# Patient Record
Sex: Female | Born: 1955
Health system: Southern US, Community
[De-identification: ages and names within clinical notes are randomized; demographics above are authoritative.]

## PROBLEM LIST (undated history)

## (undated) DIAGNOSIS — G4730 Sleep apnea, unspecified: Secondary | ICD-10-CM

## (undated) DIAGNOSIS — E669 Obesity, unspecified: Secondary | ICD-10-CM

## (undated) DIAGNOSIS — Z789 Other specified health status: Secondary | ICD-10-CM

## (undated) DIAGNOSIS — R112 Nausea with vomiting, unspecified: Secondary | ICD-10-CM

## (undated) DIAGNOSIS — I10 Essential (primary) hypertension: Secondary | ICD-10-CM

## (undated) DIAGNOSIS — F419 Anxiety disorder, unspecified: Secondary | ICD-10-CM

## (undated) DIAGNOSIS — Z9889 Other specified postprocedural states: Secondary | ICD-10-CM

## (undated) DIAGNOSIS — Q211 Atrial septal defect, unspecified: Secondary | ICD-10-CM

## (undated) DIAGNOSIS — M1712 Unilateral primary osteoarthritis, left knee: Secondary | ICD-10-CM

## (undated) DIAGNOSIS — F329 Major depressive disorder, single episode, unspecified: Secondary | ICD-10-CM

## (undated) DIAGNOSIS — T4145XA Adverse effect of unspecified anesthetic, initial encounter: Secondary | ICD-10-CM

## (undated) DIAGNOSIS — E785 Hyperlipidemia, unspecified: Secondary | ICD-10-CM

## (undated) DIAGNOSIS — G43909 Migraine, unspecified, not intractable, without status migrainosus: Secondary | ICD-10-CM

## (undated) DIAGNOSIS — F32A Depression, unspecified: Secondary | ICD-10-CM

## (undated) DIAGNOSIS — C801 Malignant (primary) neoplasm, unspecified: Secondary | ICD-10-CM

## (undated) DIAGNOSIS — G932 Benign intracranial hypertension: Secondary | ICD-10-CM

## (undated) DIAGNOSIS — T8859XA Other complications of anesthesia, initial encounter: Secondary | ICD-10-CM

## (undated) DIAGNOSIS — G4733 Obstructive sleep apnea (adult) (pediatric): Secondary | ICD-10-CM

## (undated) DIAGNOSIS — E039 Hypothyroidism, unspecified: Secondary | ICD-10-CM

## (undated) HISTORY — PX: OTHER SURGICAL HISTORY: SHX169

## (undated) HISTORY — DX: Atrial septal defect: Q21.1

## (undated) HISTORY — PX: WISDOM TOOTH EXTRACTION: SHX21

## (undated) HISTORY — PX: FRACTURE SURGERY: SHX138

## (undated) HISTORY — DX: Obstructive sleep apnea (adult) (pediatric): G47.33

## (undated) HISTORY — DX: Atrial septal defect, unspecified: Q21.10

## (undated) HISTORY — DX: Migraine, unspecified, not intractable, without status migrainosus: G43.909

## (undated) HISTORY — DX: Anxiety disorder, unspecified: F41.9

## (undated) HISTORY — DX: Benign intracranial hypertension: G93.2

## (undated) HISTORY — DX: Hyperlipidemia, unspecified: E78.5

## (undated) HISTORY — PX: BUNIONECTOMY: SHX129

## (undated) HISTORY — PX: ORTHOPEDIC SURGERY: SHX850

## (undated) HISTORY — PX: BACK SURGERY: SHX140

## (undated) HISTORY — DX: Other specified health status: Z78.9

## (undated) HISTORY — DX: Sleep apnea, unspecified: G47.30

## (undated) HISTORY — DX: Obesity, unspecified: E66.9

## (undated) HISTORY — DX: Malignant (primary) neoplasm, unspecified: C80.1

---
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
## 1999-05-05 ENCOUNTER — Other Ambulatory Visit: Admission: RE | Admit: 1999-05-05 | Discharge: 1999-05-05 | Payer: Self-pay | Admitting: Obstetrics and Gynecology

## 2000-05-20 ENCOUNTER — Other Ambulatory Visit: Admission: RE | Admit: 2000-05-20 | Discharge: 2000-05-20 | Payer: Self-pay | Admitting: Obstetrics and Gynecology

## 2000-09-08 ENCOUNTER — Ambulatory Visit (HOSPITAL_BASED_OUTPATIENT_CLINIC_OR_DEPARTMENT_OTHER): Admission: RE | Admit: 2000-09-08 | Discharge: 2000-09-08 | Payer: Self-pay | Admitting: Orthopedic Surgery

## 2001-06-17 ENCOUNTER — Encounter: Payer: Self-pay | Admitting: Internal Medicine

## 2001-06-17 ENCOUNTER — Encounter: Admission: RE | Admit: 2001-06-17 | Discharge: 2001-06-17 | Payer: Self-pay | Admitting: Internal Medicine

## 2002-02-07 ENCOUNTER — Ambulatory Visit (HOSPITAL_BASED_OUTPATIENT_CLINIC_OR_DEPARTMENT_OTHER): Admission: RE | Admit: 2002-02-07 | Discharge: 2002-02-07 | Payer: Self-pay | Admitting: Internal Medicine

## 2002-04-12 ENCOUNTER — Other Ambulatory Visit: Admission: RE | Admit: 2002-04-12 | Discharge: 2002-04-12 | Payer: Self-pay | Admitting: Obstetrics and Gynecology

## 2003-02-21 ENCOUNTER — Encounter: Admission: RE | Admit: 2003-02-21 | Discharge: 2003-02-21 | Payer: Self-pay | Admitting: Internal Medicine

## 2003-02-23 ENCOUNTER — Encounter: Admission: RE | Admit: 2003-02-23 | Discharge: 2003-02-23 | Payer: Self-pay | Admitting: Internal Medicine

## 2003-09-06 ENCOUNTER — Other Ambulatory Visit: Admission: RE | Admit: 2003-09-06 | Discharge: 2003-09-06 | Payer: Self-pay | Admitting: Obstetrics and Gynecology

## 2003-11-26 ENCOUNTER — Ambulatory Visit: Payer: Self-pay | Admitting: Internal Medicine

## 2003-12-04 ENCOUNTER — Encounter: Admission: RE | Admit: 2003-12-04 | Discharge: 2003-12-04 | Payer: Self-pay | Admitting: Internal Medicine

## 2003-12-19 ENCOUNTER — Ambulatory Visit (HOSPITAL_COMMUNITY): Admission: RE | Admit: 2003-12-19 | Discharge: 2003-12-19 | Payer: Self-pay | Admitting: Internal Medicine

## 2004-05-09 ENCOUNTER — Ambulatory Visit: Payer: Self-pay | Admitting: Internal Medicine

## 2004-06-06 ENCOUNTER — Ambulatory Visit (HOSPITAL_BASED_OUTPATIENT_CLINIC_OR_DEPARTMENT_OTHER): Admission: RE | Admit: 2004-06-06 | Discharge: 2004-06-06 | Payer: Self-pay | Admitting: Internal Medicine

## 2004-06-15 ENCOUNTER — Ambulatory Visit: Payer: Self-pay | Admitting: Internal Medicine

## 2004-06-20 ENCOUNTER — Ambulatory Visit: Payer: Self-pay | Admitting: Internal Medicine

## 2004-09-12 ENCOUNTER — Ambulatory Visit: Payer: Self-pay | Admitting: Internal Medicine

## 2004-09-26 ENCOUNTER — Ambulatory Visit: Payer: Self-pay | Admitting: Internal Medicine

## 2004-10-10 ENCOUNTER — Other Ambulatory Visit: Admission: RE | Admit: 2004-10-10 | Discharge: 2004-10-10 | Payer: Self-pay | Admitting: Obstetrics and Gynecology

## 2004-10-17 ENCOUNTER — Encounter: Admission: RE | Admit: 2004-10-17 | Discharge: 2004-10-17 | Payer: Self-pay | Admitting: Internal Medicine

## 2005-12-28 ENCOUNTER — Encounter: Admission: RE | Admit: 2005-12-28 | Discharge: 2005-12-28 | Payer: Self-pay | Admitting: Internal Medicine

## 2006-01-13 ENCOUNTER — Inpatient Hospital Stay (HOSPITAL_COMMUNITY): Admission: AD | Admit: 2006-01-13 | Discharge: 2006-01-16 | Payer: Self-pay | Admitting: Internal Medicine

## 2006-01-13 ENCOUNTER — Encounter: Admission: RE | Admit: 2006-01-13 | Discharge: 2006-01-13 | Payer: Self-pay | Admitting: Internal Medicine

## 2006-01-18 ENCOUNTER — Ambulatory Visit: Payer: Self-pay | Admitting: Gastroenterology

## 2006-03-28 ENCOUNTER — Ambulatory Visit (HOSPITAL_COMMUNITY): Admission: RE | Admit: 2006-03-28 | Discharge: 2006-03-28 | Payer: Self-pay | Admitting: Ophthalmology

## 2006-04-06 ENCOUNTER — Ambulatory Visit (HOSPITAL_COMMUNITY): Admission: RE | Admit: 2006-04-06 | Discharge: 2006-04-06 | Payer: Self-pay | Admitting: Neurology

## 2006-04-16 ENCOUNTER — Ambulatory Visit (HOSPITAL_COMMUNITY): Admission: RE | Admit: 2006-04-16 | Discharge: 2006-04-16 | Payer: Self-pay | Admitting: Internal Medicine

## 2006-09-29 ENCOUNTER — Ambulatory Visit: Payer: Self-pay | Admitting: Vascular Surgery

## 2006-10-13 ENCOUNTER — Ambulatory Visit: Payer: Self-pay | Admitting: Vascular Surgery

## 2006-11-18 ENCOUNTER — Other Ambulatory Visit: Admission: RE | Admit: 2006-11-18 | Discharge: 2006-11-18 | Payer: Self-pay | Admitting: Radiology

## 2006-11-18 ENCOUNTER — Encounter: Admission: RE | Admit: 2006-11-18 | Discharge: 2006-11-18 | Payer: Self-pay | Admitting: Neurology

## 2006-11-18 ENCOUNTER — Encounter (INDEPENDENT_AMBULATORY_CARE_PROVIDER_SITE_OTHER): Payer: Self-pay | Admitting: Radiology

## 2007-07-01 ENCOUNTER — Ambulatory Visit: Payer: Self-pay | Admitting: Cardiovascular Disease

## 2007-07-01 ENCOUNTER — Ambulatory Visit (HOSPITAL_COMMUNITY): Admission: RE | Admit: 2007-07-01 | Discharge: 2007-07-01 | Payer: Self-pay | Admitting: Cardiology

## 2007-10-25 ENCOUNTER — Encounter: Admission: RE | Admit: 2007-10-25 | Discharge: 2007-10-25 | Payer: Self-pay | Admitting: Obstetrics and Gynecology

## 2007-10-25 ENCOUNTER — Ambulatory Visit: Payer: Self-pay | Admitting: Internal Medicine

## 2008-02-03 ENCOUNTER — Ambulatory Visit: Payer: Self-pay | Admitting: Internal Medicine

## 2008-05-07 ENCOUNTER — Ambulatory Visit: Payer: Self-pay | Admitting: Internal Medicine

## 2009-05-06 ENCOUNTER — Encounter: Admission: RE | Admit: 2009-05-06 | Discharge: 2009-05-06 | Payer: Self-pay | Admitting: Internal Medicine

## 2009-05-31 ENCOUNTER — Ambulatory Visit: Payer: Self-pay | Admitting: Internal Medicine

## 2009-06-21 ENCOUNTER — Ambulatory Visit: Payer: Self-pay | Admitting: Internal Medicine

## 2009-08-26 ENCOUNTER — Encounter (INDEPENDENT_AMBULATORY_CARE_PROVIDER_SITE_OTHER): Payer: Self-pay | Admitting: *Deleted

## 2009-10-14 ENCOUNTER — Encounter (INDEPENDENT_AMBULATORY_CARE_PROVIDER_SITE_OTHER): Payer: Self-pay | Admitting: *Deleted

## 2009-11-07 ENCOUNTER — Encounter (INDEPENDENT_AMBULATORY_CARE_PROVIDER_SITE_OTHER): Payer: Self-pay | Admitting: *Deleted

## 2009-11-08 ENCOUNTER — Ambulatory Visit: Payer: Self-pay | Admitting: Internal Medicine

## 2009-11-18 ENCOUNTER — Ambulatory Visit: Payer: Self-pay | Admitting: Internal Medicine

## 2010-02-08 ENCOUNTER — Encounter: Payer: Self-pay | Admitting: Internal Medicine

## 2010-02-09 ENCOUNTER — Encounter: Payer: Self-pay | Admitting: Internal Medicine

## 2010-02-18 NOTE — Letter (Signed)
Summary: Pre Visit Letter Revised  [facility]  [address] [place], [place] [address]   Phone: [phone]  Fax: [phone]        10/14/2009 MRN: [id number] [facility] [address] [place], [place]  [address]             Procedure Date:  11-18-09   Welcome to the Gastroenterology Division at [facility].    You are scheduled to see a nurse for your pre-procedure visit on 11-04-09 at 4:30p.m. on the 3rd floor at [facility], [address]  We ask that you try to arrive at our office 15 minutes prior to your appointment time to allow for check-in.  Please take a minute to review the attached form.  If you answer "Yes" to one or more of the questions on the first page, we ask that you call the person listed at your earliest opportunity.  If you answer "No" to all of the questions, please complete the rest of the form and bring it to your appointment.    Your nurse visit will consist of discussing your medical and surgical history, your immediate family medical history, and your medications.   If you are unable to list all of your medications on the form, please bring the medication bottles to your appointment and we will list them.  We will need to be aware of both prescribed and over the counter drugs.  We will need to know exact dosage information as well.    Please be prepared to read and sign documents such as consent forms, a financial agreement, and acknowledgement forms.  If necessary, and with your consent, a friend or relative is welcome to sit-in on the nurse visit with you.  Please bring your insurance card so that we may make a copy of it.  If your insurance requires a referral to see a specialist, please bring your referral form from your primary care physician.  No co-pay is required for this nurse visit.     If you cannot keep your appointment, please call [phone] to cancel or reschedule prior to your appointment date.  This  allows [place] the opportunity to schedule an appointment for another patient in need of care.    Thank you for choosing [facility] for your medical needs.  We appreciate the opportunity to care for you.  Please visit [place] at our website  to learn more about our practice.  Sincerely, The Gastroenterology Division

## 2010-02-18 NOTE — Miscellaneous (Signed)
Summary: previsit prep/rm  Clinical Lists Changes  Medications: Added new medication of MIRALAX   POWD (POLYETHYLENE GLYCOL 3350) As per prep  instructions. - Signed Added new medication of DULCOLAX 5 MG  TBEC (BISACODYL) Day before procedure take 2 at 3pm and 2 at 8pm. - Signed Added new medication of REGLAN 10 MG  TABS (METOCLOPRAMIDE HCL) As per prep instructions. - Signed Rx of MIRALAX   POWD (POLYETHYLENE GLYCOL 3350) As per prep  instructions.;  #255gm x 0;  Signed;  Entered by: Sherren Kerns RN;  Authorized by: Hart Carwin MD;  Method used: [phone] [address] [place], [place], [place]  [address], Ph: [phone] or [phone], Fax: [phone] Rx of DULCOLAX 5 MG  TBEC (BISACODYL) Day before procedure take 2 at 3pm and 2 at 8pm.;  #4 x 0;  Signed;  Entered by: Sherren Kerns RN;  Authorized by: Hart Carwin MD;  Method used: [facility]ically to [facility] [phone]*, [address] [place], [place], [place]  [address], Ph: [phone] or [phone], Fax: [phone] Rx of REGLAN 10 MG  TABS (METOCLOPRAMIDE HCL) As per prep instructions.;  #2 x 0;  Signed;  Entered by: Sherren Kerns RN;  Authorized by: Hart Carwin MD;  Method used: [facility]ically [facility] [phone]*, [address] [place], [place], [place]  [address], Ph: [phone] or [phone], Fax: [phone] Observations: Added new observation of ALLERGY REV: Done (11/08/2009 13:25) Added new observation of NKA: T (11/08/2009 13:25)    Prescriptions: REGLAN 10 MG  TABS (METOCLOPRAMIDE HCL) As per prep instructions.  #2 x 0   Entered by:   Sherren Kerns RN   Authorized by:   Hart Carwin MD   Signed by:   Sherren Kerns RN on 11/08/2009   Method used:   [facility] to        [facility]  [address] [phone][facility] (retail)       [address]       [place], [place]  [address]       Ph: [phone] or [phone]       Fax: [phone]   RxID:   [id number] DULCOLAX 5 MG  TBEC (BISACODYL) Day before  procedure take 2 at 3pm and 2 at 8pm.  #4 x 0   Entered by:   Sherren Kerns RN   Authorized by:   Hart Carwin MD   Signed by:   Sherren Kerns RN on 11/08/2009   Method used:   [facility]ron[facility] to        [facility]  [address] [phone][facility] (retail)       [address] [place], [place]  [address]       Ph: [phone] or [phone]       Fax: [phone]   RxID:   [id number] MIRALAX   POWD (POLYETHYLENE GLYCOL 3350) As per prep  instructions.  #255gm x 0   Entered by:   Sherren Kerns RN   Authorized by:   Hart Carwin MD   Signed by:   Sherren Kerns RN on 11/08/2009   Method used:   [facility]ron[facility]        [facility]  [address] [phone][facility] (retail)       [address]  [place], [place]  [address]       Ph: [phone] or [phone]       Fax: [phone]   RxID:   [id number]

## 2010-02-18 NOTE — Letter (Signed)
Summary: Colonoscopy Letter  [facility]  [address] [place], [place] [address]   Phone: [phone]  Fax: [phone]      August 26, 2009 MRN: [id number]   [facility] [address] [place], [place]  [address]   Dear Ms. [facility],   According to your medical record, it is time for you to schedule a Colonoscopy. The American Cancer Society recommends this procedure as a method to detect early colon cancer. Patients with a family history of colon cancer, or a personal history of colon polyps or inflammatory bowel disease are at increased risk.  This letter has been generated based on the recommendations made at the time of your procedure. If you feel that in your particular situation this may no longer apply, please contact our office.  Please call our office at [phone] to schedule this appointment or to update your records at your earliest convenience.  Thank you for cooperating with [place] to provide you with the very best care possible.   Sincerely,  Hedwig Morton. Juanda Chance, M.D.  [facility] Gastroenterology Division [phone]

## 2010-02-18 NOTE — Procedures (Signed)
Summary: Colonoscopy  Patient: Diana [name] Note: All result statuses are Final unless otherwise noted.  Tests: (1) Colonoscopy (COL)   COL Colonoscopy           DONE     [facility]     [address]     [place], [place]  [address]           COLONOSCOPY PROCEDURE REPORT           PATIENT:  Diana, [name]  MR#:  [id number]     BIRTHDATE:  06/08/1955, 54 yrs. old  GENDER:  female     ENDOSCOPIST:  Hedwig Morton. Juanda Chance, MD     REF. BY:  Sharlet Salina, M.D.     PROCEDURE DATE:  11/18/2009     PROCEDURE:  Colonoscopy 00867     ASA CLASS:  Class II     INDICATIONS:  Routine Risk Screening last colon 2006 was     incomplete     MEDICATIONS:   Versed 10 mg, Fentanyl 100 mcg           DESCRIPTION OF PROCEDURE:   After the risks benefits and     alternatives of the procedure were thoroughly explained, informed     consent was obtained.  Digital rectal exam was performed and     revealed no rectal masses.   The LB PCF-H180AL [id number] endoscope     was introduced through the anus and advanced to the cecum, which     was identified by both the appendix and ileocecal valve, without     limitations.  The quality of the prep was good, using MiraLax.     The instrument was then slowly withdrawn as the colon was fully     examined.     <<PROCEDUREIMAGES>>           FINDINGS:  No polyps or cancers were seen (see image1, image2,     image5, image6, image8, and image9).   Retroflexed views in the     rectum revealed no abnormalities.    The scope was then withdrawn     from the patient and the procedure completed.           COMPLICATIONS:  None     ENDOSCOPIC IMPRESSION:     1) No polyps or cancers     2) Normal colonoscopy     redundant colon     RECOMMENDATIONS:     1) high fiber diet     REPEAT EXAM:  In 10 year(s) for.           ______________________________     Hedwig Morton. Juanda Chance, MD           CC:           n.     eSIGNED:   Hedwig Morton. Brodie at 11/18/2009 02:19 PM          Berline Lopes, [phone]  Note: An exclamation mark (!) indicates a result that was not dispersed into the flowsheet. Document Creation Date: 11/18/2009 2:20 PM _______________________________________________________________________  (1) Order result status: Final Collection or observation date-time: 11/18/2009 14:12 Requested date-time:  Receipt date-time:  Reported date-time:  Referring Physician:   Ordering Physician: Lina Sar [phone]) Specimen Source:  Source: Launa Grill Order Number: [id number] Lab site:   Appended Document: Colonoscopy    Clinical Lists Changes  Observations: Added new observation of COLONNXTDUE: 10/2019 (11/18/2009 16:01)

## 2010-02-18 NOTE — Letter (Signed)
Summary: Miralax Instructions  [facility]  [address]   [place], [place] [address]   Phone: [phone]  Fax: [phone]       RAINN ZUPKO    20-Feb-1955    MRN: [id number]       Procedure Day /Date: Monday, 11-18-09     Arrival Time: 12:30 p.m.     Procedure Time: 1:30 p.m.     Location of Procedure:                    x  [facility] (4th Floor)    PREPARATION FOR COLONOSCOPY WITH MIRALAX  Starting 5 days prior to your procedure 11-13-09 do not eat nuts, seeds, popcorn, corn, beans, peas,  salads, or any raw vegetables.  Do not take any fiber supplements (e.g. Metamucil, Citrucel, and Benefiber). ____________________________________________________________________________________________________   THE DAY BEFORE YOUR PROCEDURE         DATE: 11-17-09  DAY: Sunday  1   Drink clear liquids the entire day-NO SOLID FOOD  2   Do not drink anything colored red or purple.  Avoid juices with pulp.  No orange juice.  3   Drink at least 64 oz. (8 glasses) of fluid/clear liquids during the day to prevent dehydration and help the prep work efficiently.  CLEAR LIQUIDS INCLUDE: Water Jello Ice Popsicles Tea (sugar ok, no milk/cream) Powdered fruit flavored drinks Coffee (sugar ok, no milk/cream) Gatorade Juice: apple, white grape, white cranberry  Lemonade Clear bullion, consomm, broth Carbonated beverages (any kind) Strained chicken noodle soup Hard Candy  4   Mix the entire bottle of Miralax with 64 oz. of Gatorade/Powerade in the morning and put in the refrigerator to chill.  5   At 3:00 pm take 2 Dulcolax/Bisacodyl tablets.  6   At 4:30 pm take one Reglan/Metoclopramide tablet.  7  Starting at 5:00 pm drink one 8 oz glass of the Miralax mixture every 15-20 minutes until you have finished drinking the entire 64 oz.  You should finish drinking prep around 7:30 or 8:00 pm.  8   If you are nauseated, you may take the 2nd  Reglan/Metoclopramide tablet at 6:30 pm.        9    At 8:00 pm take 2 more DULCOLAX/Bisacodyl tablets.     THE DAY OF YOUR PROCEDURE      DATE:  11-18-09  DAY: Monday  You may drink clear liquids until 11:30 a.m.  (2 HOURS BEFORE PROCEDURE).   MEDICATION INSTRUCTIONS  Unless otherwise instructed, you should take regular prescription medications with a small sip of water as early as possible the morning of your procedure.   Additional medication instructions: n/a         OTHER INSTRUCTIONS  You will need a responsible adult at least 57 years of age to accompany you and drive you home.   This person must remain in the waiting room during your procedure.  Wear loose fitting clothing that is easily removed.  Leave jewelry and other valuables at home.  However, you may wish to bring a book to read or an iPod/MP3 player to listen to music as you wait for your procedure to start.  Remove all body piercing jewelry and leave at home.  Total time from sign-in until discharge is approximately 2-3 hours.  You should go home directly after your procedure and rest.  You can resume normal activities the day after your procedure.  The day of your procedure you should  not:   Drive   Make legal decisions   Operate machinery   Drink alcohol   Return to work  You will receive specific instructions about eating, activities and medications before you leave.   The above instructions have been reviewed and explained to me by   Sherren Kerns RN  November 08, 2009 1:40 PM    I fully understand and can verbalize these instructions _____________________________ Date _______

## 2010-04-14 ENCOUNTER — Ambulatory Visit (INDEPENDENT_AMBULATORY_CARE_PROVIDER_SITE_OTHER): Payer: 59 | Admitting: Internal Medicine

## 2010-04-14 ENCOUNTER — Ambulatory Visit: Payer: 59 | Admitting: Internal Medicine

## 2010-04-14 DIAGNOSIS — J209 Acute bronchitis, unspecified: Secondary | ICD-10-CM

## 2010-04-14 DIAGNOSIS — J019 Acute sinusitis, unspecified: Secondary | ICD-10-CM

## 2010-04-15 ENCOUNTER — Ambulatory Visit: Payer: Self-pay | Admitting: Internal Medicine

## 2010-06-03 NOTE — Consult Note (Signed)
NEW PATIENT CONSULTATION   PAMLA, PANGLE  DOB:  [birth year]                                       10/13/2006  ZOXWR#:[id number]   REASON FOR VISIT:  Harriett Azar was in today for evaluation of left  leg edema.  She reports that she has had a long history of swelling in  both lower extremities and this is more so on the left than on the  right.  She also has some degenerative disk disease that has been  attributed to some of her leg pain.  She also has some knee swelling and  pain and had recently undergone deep tissue left leg massage for her  discomfort.  She had marked swelling following this and underwent non-  invasive vascular laboratory study in her office on 09/29/2006.  This  was negative for DVT but did show some evidence of mild valvular  incompetence and I am seeing her for further discussion of this.   PAST MEDICAL HISTORY:  She denies any prior history of DVT.  She does  have a remote history of pinning in her left 5th metatarsal bone.  She  has had bilateral bunionectomy.  She also has history of right knee  replacement.  She does attempt to wear over the counter compression  garments but reports this actually makes matters worse at times.   PAST MEDICAL HISTORY:  Negative for pulmonary embolus.  She is not on  any anticoagulation therapy.  She does not have any history of  hypertension, diabetes or other major medical difficulties.   PHYSICAL EXAMINATION:  She is a well developed, well nourished white  female appearing stated age of 15.  She does not have any significant  right or left leg swelling today.  She has some scattered small spider  vein telangiectasia but no evidence of large varicosities.  I reviewed  her duplex with her and also I did a screening study with hand held  duplex myself.  This does not show any evidence of significant saphenous  vein reflux or incompetence.  Duplex did show a mild partial reflex at  her left common  femoral vein and saphenofemoral junction.   DISCUSSION:  I discussed this at length with Ms. Kyung Rudd.  I explained  that I do not feel that her swelling or pain is attributable to the mild  partial incompetence proximally at her saphenofemoral junction.  I  cannot relate this to the episode of deep tissue massage but certainly  her swelling did come on immediately following this.  I have recommended  observation only and would not recommend any more aggressive treatment  since she does not have any evidence of severe reflux.  She was  comfortable with this and also understands that there is no evidence of  DVT or other serious issue regarding this.  She will see [place] again on an  as needed basis.   Larina Earthly, M.D.  Electronically Signed   TFE/MEDQ  D:  10/13/2006  T:  10/14/2006  Job:  [id number]   cc:   Katy Fitch. Sypher, M.D.

## 2010-06-03 NOTE — Procedures (Signed)
DUPLEX DEEP VENOUS EXAM - LOWER EXTREMITY   INDICATION:  Left calf pain and edema.   HISTORY:  Edema:  Chronic left calf edema.  Trauma/Surgery:  Patient has a pin in her left metatarsal since 2003.  She has had a recent deep tissue left leg massage.  Pain:  Chronic left calf pain.  PE:  No.  Previous DVT:  No.  Anticoagulants:  No.  Other:  No.   DUPLEX EXAM:                CFV   SFV   PopV  PTV    GSV                R  L  R  L  R  L  R   L  R  L  Thrombosis       0     0     0      0     0  Spontaneous      +     +     +      +     +  Phasic           +     +     +      +     +  Augmentation     +     +     +      +     +  Compressible     +     +     +      +     +  Competent        P     +     +      +     P   Legend:  + - yes  o - no  p - partial  D - decreased   IMPRESSION:  1. No evidence of left leg deep venous thrombosis or superficial vein      thrombosis.  2. No evidence of left leg Baker cyst.  3. The left common femoral vein and saphenofemoral junctions are      mildly incompetent.  4. There appears to be an incompetent perforator vein seen in the      proximal medial calf.    _____________________________  Larina Earthly, M.D.   MC/MEDQ  D:  09/29/2006  T:  09/30/2006  Job:  [id number]

## 2010-06-06 NOTE — Op Note (Signed)
[place]  Patient:    Diana [name], Diana [name] Visit Number: [id number] MRN: [id number]          Service Type: DSU Location: [facility] Attending Physician:  Colbert Ewing Proc. Date: 09/08/00 Adm. Date:  09/08/2000                             Operative Report  PREOPERATIVE DIAGNOSIS:  Displaced fifth metatarsal shaft fracture, left foot.  POSTOPERATIVE DIAGNOSIS:  Displaced fifth metatarsal shaft fracture, left foot.  PROCEDURE:  Open reduction and internal fixation, fifth metatarsal fracture, with cannulated 6-8 mm 4.5 partially-threaded screw.  SURGEON:  Loreta Ave, M.D.  ASSISTANT:  Arlys John D. Petrarca, P.A.-C.  ANESTHESIA:  Ankle block.  SPECIMENS:  None.  CULTURES:  None.  COMPLICATIONS:  None.  DRESSING:  Soft compressive with Cam walker.  TOURNIQUET TIME:  30 minutes with Esmarch at the ankle.  DESCRIPTION OF PROCEDURE:  Patient brought to the operating room and placed on the operating table in supine position.  After adequate anesthesia had been obtained, the left foot was prepped and draped in the usual sterile fashion. Exsanguinated with elevation and Esmarch, Esmarch left as a tourniquet at the ankle.  Fluoroscopy used for guidance.  Able to reduce the distal fracture by closed means into near-anatomic alignment with distraction and manual pressure at the fracture site.  Then incision was made at the base of the fifth metatarsal.  The base was accessed, and a guidewire was passed from the base of the fifth metatarsal across the fracture site, into the head and not penetrating into the joint.  The fracture was held reduced anatomically while this was done.  I measured for a 6.8 mm partially-threaded screw.  Predrilled. The screw was then placed from proximal to distal across the fracture site with excellent capturing and fixation into the fifth metatarsal head and not penetrating into the joint.  This yielded anatomic  alignment on all views with solid, stable fixation.  The wound was irrigated.  Prior to this, the screw had been sufficiently countersunk so that it would not be prominent.  The wound was closed with nylon.  The margins were injected with Marcaine. Sterile compressive dressing applied.  Esmarch removed.  Cam walker applied. Anesthesia reversed.  Brought to recovery room.  Tolerated the surgery well, no complications. Attending Physician:  Colbert Ewing DD:  09/08/00 TD:  09/09/00 Job: [id number]

## 2010-06-06 NOTE — Procedures (Signed)
NAME:  Diana [name], Diana [name] NO.:  [id number]   MEDICAL RECORD NO.:  [id number]          PATIENT TYPE:  OUT   LOCATION:  SLEEP CENTER                 FACILITY:  [facility]   PHYSICIAN:  Clinton D. Maple Hudson, M.D. DATE OF BIRTH:  March 16, 1955   DATE OF STUDY:  06/06/2004                              NOCTURNAL POLYSOMNOGRAM   REFERRING PHYSICIAN:  Dr. Jetty Duhamel   DATE OF STUDY:  Jun 06, 2004   INDICATION FOR STUDY:  Insomnia with sleep apnea.  Epworth Sleepiness Score  12/24, BMI 31, weight 210 pounds.   SLEEP ARCHITECTURE:  Total sleep time 325 minutes with sleep efficiency 75%.  Stage I was 12%, stage II 51%, stages III and IV 22% and REM was 15% of  total sleep time.  Sleep latency 38 minutes, REM latency 135 minutes, awake  after sleep onset 75 minutes, arousal index 15.   RESPIRATORY DATA:  Respiratory disturbance index (RDI, AHI) 0.7 obstructive  events per hour which is within normal.  There were 4 hypopneas.  She slept  supine.  REM RDI 4.9.   OXYGEN DATA:  Insignificant snoring noted.  Oxygen saturation nadir was 90%  with mean oxygen saturation through the study 96% on room air.   CARDIAC DATA:  Normal sinus rhythm.   MOVEMENT/PARASOMNIA:  A total of 116 limb jerks were recorded of which 9  were associated with arousal or awakening for a periodic limb movement with  arousal index 1.7 per hour which is unremarkable.   IMPRESSION/RECOMMENDATION:  1.  Occasional sleep-disordered breathing events, within normal limits.  2.  Some nonspecific waking after sleep onset.  3.  Very mild periodic limb movement with arousal of 1.7 per hour.      CDY/MEDQ  D:  06/15/2004 10:50:15  T:  06/15/2004 11:37:01  Job:  [id number]

## 2010-06-06 NOTE — Discharge Summary (Signed)
NAME:  Diana [name], Diana [name] NO.:  [id number]   MEDICAL RECORD NO.:  [id number]          PATIENT TYPE:  INP   LOCATION:  5012                         FACILITY:  [facility]   PHYSICIAN:  Luanna Cole. Lenord Fellers, M.D.   DATE OF BIRTH:  19-Feb-1955   DATE OF ADMISSION:  01/13/2006  DATE OF DISCHARGE:  01/16/2006                               DISCHARGE SUMMARY   FINAL DIAGNOSES:  1. Volume depletion.  2. Viral syndrome.  3. History of depression.  4. Selective serotonin reuptake inhibitor discontinuation syndrome.   CONDITION:  Good and improved.   DISPOSITION:  Discharge home with follow up with Dr. Lina Sar on  February 15, 2006.  Patient is to have gastric emptying study January 26, 2006.   DISCHARGE MEDICATION:  Luvox 150 mg p.o. q.a.m. and 200 mg h.s.   CONSULTANT:  Dr. Barnet Pall with [facility].   PROCEDURES:  Upper endoscopy.   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  Initially, she had fever, myalgias, malaise, and no  appetite.  Subsequently developed nausea, but only vomited once.  She  was seen at [facility] December 26, 2005 and diagnosed with viral  syndrome.  She called me December 27, 2005 for nausea prescription.  Phenergan was called in for her.  Later this was changed to Zofran after  persistent malaise and nausea with no appetite.  Patient was placed on  Protonix January 04, 2006.  Labs in my office were within normal limits  and included a CBC, CMET with liver function test and hemoglobin A1c.  The patient had been given IV fluids in my office on 3 occasions since  December 28, 2005.  Each time 1 liter of lactated Ringer was given IV.  She would improved after IV fluids, but would revert back to lethargy,  no appetite and generalized weakness.  Patient has been on Luvox for  years, 150 mg p.o. q.a.m. and 200 mg h.s.  for history of depression.  However, since the patient was so nauseated she had discontinued Luvox  some 3 weeks before admission and I was not aware of it until the day of  admission.  She also had stopped Protonix.  The patient had been treated  with antibiotics because of cough (Levaquin), but took only 7 days  instead of the prescribed 10 days.  Patient had 2 chest x-rays, one on  December 28, 2005 and another one on January 13, 2006, both of which  showed no evidence of pneumonia.  Patient was admitted January 13, 2006  with volume depletion.  Urine specific gravity was 1.020, blood pressure  was 122/82.  Patient had no orthostatic blood pressure changes, but when  standing she became tachycardic to 126.  She has been unable to work  since December 22, 2005.  She was admitted for IV fluid rehydration,  evaluation by gastroenterologist and restarting of Luvox.  On the day of  admission the patient had a weight of 206 pounds.  Previous weight on  December 28, 2005 had been 221 pounds.  She had low-grade temperature of  100.4 degrees on December 28, 2005.  Thyroid functions were checked in  September 2007 and were normal.  In November 2005 she had a protracted  respiratory infection lasting some 4 weeks with fatigue, improved with  antibiotics and prednisone.  CT at that time showed mucosal thickening  in her sinuses.  At that time she had an extensive workup and Epstein-  Barr titer showed past infection and CMV titer was negative.  In 2006  she saw Dr. Fannie Knee regarding possible sleep apnea, but she had 2  sleep studies which were normal.  Nonetheless, she purchased a CPAP  apparatus for herself, saying that she felt better rested with it.  Patient sees Dr. Andee Poles for longstanding depression that  started after fertility treatments years ago.  Patient has tried  Wellbutrin and Provigil, but stayed on Luvox for years until about 3  weeks prior to admission.  Patient had  colonoscopy in September 2006 by  Dr. Lina Sar which was normal except Dr. Juanda Chance was unable to  intubate the cecal pouch.  Patient's husband was recently diagnosed with  a giant colon polyp which is not cancerous and he scheduled to have that  removed soon.  It is my suspicion she is not eating and drinking as much  as she has indicated to me at home, because her specific gravity of her  urine is fairly concentrated at 1.020.  She does have hemoconcentration  on her CBC and there are ketones (starvation) in her urine.  Patient  says she has a remote history of stomach lining inflammation but no  old records to confirm this.  She says she has stomach and colon cancer  in aunts and uncles on both sides of the family.  The patient is a  nonsmoker.  Patient is married, has no children, and moved here around  2000.  She had a pin placed in her left fifth metatarsal in August 2002.  She had an L5-S1 laminectomy at [facility] in [place].  She had bilateral bunionectomies in 1982 in [place], [place].   FAMILY HISTORY:  Mother with history of hypertension, glaucoma and  osteoarthritis.  Father with history of early Alzheimer's disease and a  minor CVA.  One brother in good health.   PHYSICAL EXAM:  VITAL SIGNS:  Weight 206 pounds.  Previous weight 221  pounds December 28, 2005.  Temperature 96.8 degrees orally.  Blood  pressure 132/92, pulse 106 regular lying.  Sitting pulse is 120 with  blood pressure of 136/88.  Standing blood pressure is 116/90 with pulse  of 126.  SKIN:  Is cool and dry.  HEENT EXAM:  Head is normocephalic, atraumatic.  Sclerae and  conjunctivae are clear.  TMs and pharynx are clear.  NECK:  Is supple without thyromegaly or adenopathy.  CHEST:  Clear to auscultation.  CARDIAC EXAM:  Regular rate and rhythm.  No murmur appreciated.  ABDOMEN:  No organomegaly, masses or tenderness.  EXTREMITIES:  No deformity or edema.  HOSPITAL COURSE:  Patient was  admitted for vigorous IV fluid hydration  with D5 normal saline at 125 mL/hour for 12 hours then decreasing to 100  mL/hour.  The patient was placed on a clear liquid diet and was given  Tylenol 2 tablets p.o. q.4h. p.r.n. minor pain.  Amylase and lipase were  checked and proved to be within normal limits.  She was placed on  Protonix 40 mg daily and started back on Luvox 150 mg p.o. q.a.m. and  200 mg p.o. q.h.s.  She was also given Zofran 4 mg sublingual q.8h. as  needed for nausea.  The following day CBC and BMET were drawn and proved  to be within normal limits.  Diet was advanced to a bland soft diet.  She was seen in consultation by Dr. Melvia Heaps with [facility] and  she underwent endoscopy on January 15, 2006 which was normal from  proximal esophagus to the second portion of the duodenum.  A gastric  emptying study was suggested by Dr. Arlyce Dice.  It could not be arranged  inpatient and was scheduled as an outpatient after discharge January 26, 2006.  Patient did well during this hospitalization and felt much better  after vigorous IV fluid hydration during her entire hospital course.  She was discharged home in good condition on January 16, 2006.  Patient  was advised follow up with Dr. Lina Sar on February 15, 2006 and to  continue with Luvox 150 mg p.o. q.a.m. and 200 mg h.s.  The patient  later  called my office several days after discharge and indicated she felt so  much better that she would be canceling her gastric emptying study.  Patient was advised to drink plenty of fluids at home and was to advance  diet slowly from a bland soft diet to a regular diet.           ______________________________  Luanna Cole. Lenord Fellers, M.D.     MJB/MEDQ  D:  03/25/2006  T:  03/26/2006  Job:  [id number]   cc:   [facility]  Barbette Hair. Arlyce Dice, MD,FACG  Hedwig Morton. Juanda Chance, MD

## 2010-06-12 ENCOUNTER — Telehealth: Payer: Self-pay | Admitting: Cardiology

## 2010-06-12 NOTE — Telephone Encounter (Signed)
RETURNING CALL FROM Monday OR Tuesday. NOT SURE WHO CALLED OR WHY

## 2010-06-17 ENCOUNTER — Telehealth: Payer: Self-pay | Admitting: *Deleted

## 2010-06-17 NOTE — Telephone Encounter (Signed)
Returning pt's call regarding Dr. Deborah Chalk retiring.  Pt told to f/u with Dr. Dietrich Pates for any cardiac issues.  Pt will contact Dr. Tenny Craw on a PRN basis.

## 2010-08-27 ENCOUNTER — Other Ambulatory Visit: Payer: Self-pay | Admitting: Obstetrics and Gynecology

## 2010-08-27 DIAGNOSIS — Z1231 Encounter for screening mammogram for malignant neoplasm of breast: Secondary | ICD-10-CM

## 2010-09-15 ENCOUNTER — Ambulatory Visit
Admission: RE | Admit: 2010-09-15 | Discharge: 2010-09-15 | Disposition: A | Discharge status: Home / Self Care | Payer: 59 | Source: Ambulatory Visit | Attending: Obstetrics and Gynecology | Admitting: Obstetrics and Gynecology

## 2010-09-15 DIAGNOSIS — Z1231 Encounter for screening mammogram for malignant neoplasm of breast: Secondary | ICD-10-CM

## 2010-09-18 ENCOUNTER — Other Ambulatory Visit: Payer: Self-pay | Admitting: Dermatology

## 2010-11-07 ENCOUNTER — Ambulatory Visit (INDEPENDENT_AMBULATORY_CARE_PROVIDER_SITE_OTHER): Payer: 59 | Admitting: Internal Medicine

## 2010-11-07 DIAGNOSIS — T8140XA Infection following a procedure, unspecified, initial encounter: Secondary | ICD-10-CM

## 2010-11-07 DIAGNOSIS — F329 Major depressive disorder, single episode, unspecified: Secondary | ICD-10-CM

## 2010-11-07 DIAGNOSIS — G43909 Migraine, unspecified, not intractable, without status migrainosus: Secondary | ICD-10-CM

## 2010-11-07 DIAGNOSIS — J40 Bronchitis, not specified as acute or chronic: Secondary | ICD-10-CM

## 2010-11-07 DIAGNOSIS — Z85820 Personal history of malignant melanoma of skin: Secondary | ICD-10-CM

## 2010-11-07 DIAGNOSIS — T8149XA Infection following a procedure, other surgical site, initial encounter: Secondary | ICD-10-CM

## 2010-11-07 DIAGNOSIS — G4730 Sleep apnea, unspecified: Secondary | ICD-10-CM

## 2010-11-07 DIAGNOSIS — E559 Vitamin D deficiency, unspecified: Secondary | ICD-10-CM

## 2010-11-13 ENCOUNTER — Other Ambulatory Visit: Payer: Self-pay | Admitting: Dermatology

## 2010-11-17 ENCOUNTER — Encounter: Payer: Self-pay | Admitting: Internal Medicine

## 2010-11-17 DIAGNOSIS — Z85820 Personal history of malignant melanoma of skin: Secondary | ICD-10-CM | POA: Insufficient documentation

## 2010-11-17 DIAGNOSIS — G4730 Sleep apnea, unspecified: Secondary | ICD-10-CM | POA: Insufficient documentation

## 2010-11-17 DIAGNOSIS — G43909 Migraine, unspecified, not intractable, without status migrainosus: Secondary | ICD-10-CM | POA: Insufficient documentation

## 2010-11-17 DIAGNOSIS — F329 Major depressive disorder, single episode, unspecified: Secondary | ICD-10-CM | POA: Insufficient documentation

## 2010-11-17 DIAGNOSIS — E559 Vitamin D deficiency, unspecified: Secondary | ICD-10-CM | POA: Insufficient documentation

## 2010-11-17 NOTE — Progress Notes (Signed)
  Subjective:    Patient ID: Diana [name], female    DOB: [birth year], 55 y.o.   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 Has congested deep cough, malaise and fatigue. Has a history of bouts of bronchitis.  History of depression maintained currently on Pristiq. History of migraine headache and used to take Topamax per Dr. Sandria Manly and uses Imitrex when necessary. History of vitamin D deficiency and sleep apnea.  Came in today mainly for respiratory infection but tells me she's been diagnosed with malignant melanoma left upper thigh. Records from [facility] hospitals indicate that the Breslow depth was 0.9 mm with a mitotic rate of 1 mm2. Lesion was discovered by Dr. Nicholas Lose. Hepatology revealed this to be a super Fischel spreading melanoma. She had a wide local excision along with a sentinel lymph node biopsy which was negative. This was done in September 2012. The lesion became infected and she was placed on Keflex on 11/03/2010 for 7 days. This caused her to be nauseated. Recent CBC and liver function and and a within normal limits done 3 [facility]    Review of Systems     Objective:   Physical Exam and HEENT exam: Reveals a slightly injected pharynx; TMs are clear; neck supple; chest clear to auscultation.        Assessment & Plan:  Bronchitis  History of malignant melanoma status post wide excision and sentinel mode biopsy with recent infection of surgical site treated with Keflex but this caused nausea  Plan: Stop Keflex. Given Levaquin 500 milligrams daily for 10 days.

## 2010-11-17 NOTE — Patient Instructions (Signed)
Stop Keflex. Take Levaquin 500 milligrams daily for 10 days. Call if not better in 7-10 days

## 2011-08-16 ENCOUNTER — Other Ambulatory Visit: Payer: Self-pay | Admitting: Internal Medicine

## 2011-09-28 ENCOUNTER — Encounter: Payer: Self-pay | Admitting: Cardiology

## 2012-01-22 ENCOUNTER — Other Ambulatory Visit: Payer: Self-pay | Admitting: Internal Medicine

## 2012-01-22 DIAGNOSIS — Z1231 Encounter for screening mammogram for malignant neoplasm of breast: Secondary | ICD-10-CM

## 2012-02-15 ENCOUNTER — Ambulatory Visit
Admission: RE | Admit: 2012-02-15 | Discharge: 2012-02-15 | Disposition: A | Discharge status: Home / Self Care | Payer: BC Managed Care – PPO | Source: Ambulatory Visit | Attending: Internal Medicine | Admitting: Internal Medicine

## 2012-02-15 DIAGNOSIS — Z1231 Encounter for screening mammogram for malignant neoplasm of breast: Secondary | ICD-10-CM

## 2012-06-15 ENCOUNTER — Ambulatory Visit: Payer: Self-pay | Admitting: Neurology

## 2012-06-15 ENCOUNTER — Encounter: Payer: Self-pay | Admitting: Neurology

## 2012-06-15 ENCOUNTER — Ambulatory Visit (INDEPENDENT_AMBULATORY_CARE_PROVIDER_SITE_OTHER): Payer: BC Managed Care – PPO | Admitting: Neurology

## 2012-06-15 ENCOUNTER — Telehealth: Payer: Self-pay | Admitting: Neurology

## 2012-06-15 VITALS — BP 146/82 | HR 91 | Temp 98.2°F | Ht 66.0 in | Wt 239.0 lb

## 2012-06-15 DIAGNOSIS — G478 Other sleep disorders: Secondary | ICD-10-CM | POA: Insufficient documentation

## 2012-06-15 DIAGNOSIS — G4730 Sleep apnea, unspecified: Secondary | ICD-10-CM

## 2012-06-15 NOTE — Progress Notes (Signed)
[facility]  Provider:  Dr Genelda Roark Referring Provider:  Andee Poles MD  Primary Care Physician:  Margaree Mackintosh, MD  Chief Complaint  Patient presents with  . New Evaluation    CPAP, rm 11    HPI:  TEIANA HAJDUK is a 57 y.o. female here as a referral from Dr. Nolen Mu. She used to follow  With dr Sandria Manly, who signed her CPAP orders, but she was never scheduled for a formal sleep evaluation.    [name] underwent a split study on 9-28 2012 at the time she had presented to Dr. Nolen Mu is a chief complaint of excessive daytime sleepiness witnessed apneas and loud snoring. Her Epworth sleepiness score was 18 points and she and the patient used to CPAP prior to the titration 7 years and remarked that her Epworth was than 18 points   and neck circumference measured 15 inches at the patient was titrated to 8 cm water.leepiness score on CPAP was 3/24 points. The patient a BMI of 31.2 at the time. The patients husband had witnessed apneas and snoring when the patient  Was no using CPAP. She had been placed on an auto-titrator before the SPLIT study and slept well with it.  She tried changing to a different sleep setting of 9 cm water, and resumed afterwards her old automachine.  The patient never had a tonsillectomy, and in her youth on [place] used a Mining engineer for retrognathia. . She has no TMJ.  She has a deformed anterior chest wall,  Thorax excavatum.  Patient reports going to bed between 10 and 11 PM she rises at 8:30 in the morning. Her estimated nocturnal sleep time about 7 hours. He does not complain of nocturia, as no breakthrough snoring or witnessed apneas.    The patient's on the cardiology history is a patent foramen ovale, she has or a body weight of 239 pounds at 5 feet 8 inches. Since the last sleep study she first gained over 60 pounds and lost already 25.  College years the patient has never worked shift work again., She does not take daytime  naps. The patient is using her 57 year old autotitrator machine. She received a new one after her sleep study end of 2012 but was unable to tolerate it.    Fam. history: Her father has a history of excessive daytime sleepiness loud snoring and she witnessed several times apneas also he was never formally diagnosed. He lived  to be 95.            Review of Systems: Out of a complete 14 system review, the patient complains of only the following symptoms, and all other reviewed systems are negative.   History   Social History  . Marital Status: Married    Spouse Name: N/A    Number of Children: 0  . Years of Education: masters   Occupational History  .      [employer]   Social History Main Topics  . Smoking status: Never Smoker   . Smokeless tobacco: Never Used  . Alcohol Use: Yes     Comment: one glass of wine on weekends  . Drug Use: No  . Sexually Active: Not on file   Other Topics Concern  . 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 She was referred for evaluation of what was previously been diagnosed as an ASD in  2001 at [facility]. She was advised to have it followed medically. She is marrried and lives with her spouse . She may have two glasses  of wine a week. She does not use tobacco. She sleeps with a CPAP.    Family History  Problem Relation Age of Onset  . Alzheimer's disease Mother   . Rheumatic fever Father     Past Medical History  Diagnosis Date  . Atrial septal defect     Small,non significant atrial septal defect with QP/QS of 1.25 by MRA.  . Obesity   . Statin intolerance     with elevated CRP  . Pseudotumor cerebri Diagnosed in March 2008-    She sees Dr Lenis Noon in this regard  . Sleep apnea   . Obesity   . Migraine headache   . Anxiety     controlled  . Migraine     occasionally  . Obstructive apnea     CPAP user since 2012.     Past Surgical History  Procedure  Laterality Date  . Orthopedic surgery      lumbar surgery   . Right knee arthroscopy    . Bunionectomy    . Pin in the fifth metatarsal      Current Outpatient Prescriptions  Medication Sig Dispense Refill  . cetirizine (ZYRTEC) 10 MG tablet Take 10 mg by mouth daily.        Marland Kitchen desvenlafaxine (PRISTIQ) 50 MG 24 hr tablet Take 50 mg by mouth daily.        Marland Kitchen DHEA 10 MG TABS Take by mouth 3 x daily with food.      Marland Kitchen EST ESTROGENS-METHYLTEST PO Take by mouth. Every three months      . NALTREXONE HCL PO Take 3 mg by mouth at bedtime.      . progesterone (PROMETRIUM) 100 MG capsule Take 100 mg by mouth at bedtime.      . SUMAtriptan (IMITREX) 100 MG tablet TAKE 1 TABLET BY MOUTH AT ONSET OF MIGRAINE AS DIRECTED  9 tablet  3  . thyroid (ARMOUR THYROID) 180 MG tablet Take 180 mg by mouth daily.      Marland Kitchen VITAMIN D, CHOLECALCIFEROL, PO Take 25,000 Units by mouth. One pill every other day       No current facility-administered medications for this visit.    Allergies as of 06/15/2012  . (No Known Allergies)    Vitals: BP 146/82  Pulse 91  Temp(Src) 98.2 F (36.8 C) (Oral)  Ht 5\' 6"  (1.676 m)  Wt 239 lb (108.41 kg)  BMI 38.59 kg/m2 Last Weight:  Wt Readings from Last 1 Encounters:  06/15/12 239 lb (108.41 kg)   Last Height:   Ht Readings from Last 1 Encounters:  06/15/12 5\' 6"  (1.676 m)   Vision Screening:   Physical exam:  General: The patient is awake, alert and appears not in acute distress. The patient is well groomed. Head: Normocephalic, atraumatic. Neck is supple. Mallampati 2 , strong gag, retrognathia.  neck circumference:16 inches . Cardiovascular:  Regular rate and rhythm, without  murmurs or carotid bruit, and without distended neck veins. Respiratory: Lungs are clear to auscultation. Skin:  Without evidence of edema, or rash Trunk: BMI is elevated and patient  has normal posture.  Neurologic exam : The patient is awake and alert, oriented to place and time.  Memory  subjectivedescribed as intact. There is a normal attention span & concentration ability. Speech is fluent without  dysarthria, dysphonia or  aphasia. Mood and affect are appropriate.  Cranial nerves: Pupils are equal and briskly reactive to light. Funduscopic exam without  evidence of pallor or edema.  Extraocular movements  in vertical and horizontal planes intact and without nystagmus. Visual fields by finger perimetry are intact. Hearing to finger rub intact.  Facial sensation intact to fine touch. Facial motor strength is symmetric and tongue and uvula move midline.  Motor exam:   Normal tone and normal muscle bulk and symmetric normal strength in all extremities.  Sensory:  Fine touch, pinprick and vibration were tested in all extremities. Proprioception is tested in the upper extremities only. This was  normal.  Coordination: Rapid alternating movements in the fingers/hands is tested and normal. Finger-to-nose maneuver tested and normal without evidence of ataxia, dysmetria or tremor.  Gait and station: Patient walks without assistive device and is able and assisted stool climb up to the exam table. Strength within normal limits. Stance is stable and normal.  Steps are unfragmented.  Deep tendon reflexes: in the  upper and lower extremities are symmetric and intact. Babinski maneuver response is  downgoing.   Assessment:  After physical and neurologic examination, review of laboratory studies, imaging, neurophysiology testing and pre-existing records, assessment will be reviewed on the problem list.  Plan:   Patient's sleep study failed to document frank apnea, but showed evidence of upper airway resistance syndrome. The patient's sleep was interrupted by her on snoring, and she had truncated airflow indicating that she work harder to a brief without showing the [place] toward loss that is necessary to score these advance as apneas or hypopnea. Patient has used an Higher education careers adviser for many  years before her repeat sleep study in 2012. She has felt better sleeping with an auto titrated CPAP and she is using a nasal mask. She could not tolerate the fat CPAP pressure as recommended by myself after her September 2012 study dated 8 cm water. No data  downloads available from her meanwhile 58-year-old machine but she prefers to use.  I would like for the patient to be able to use the newer downloadable machine that was the setting of an ultra titrator. Again positive airway pressure isn't this case not used for clearly obstructive apnea, but for the upper airway resistance isn't on. The clinical response total therapy has been positive for the patient and she should be able to continue. She does not like nasal pillow interface is and I will be happy to write for a new nasal mask to advanced on care. Advanced on care to make the changes in the setting for an ultra titrator Maud on her machine, which I thought was possible. I would then like to see a download within the next 30 days to evaluate compliance, therapeutic data and to arrange a regular followup with the patient.

## 2012-06-15 NOTE — Patient Instructions (Addendum)
Exercise to Lose Weight Exercise and a healthy diet may help you lose weight. Your doctor may suggest specific exercises. EXERCISE IDEAS AND TIPS  Choose low-cost things you enjoy doing, such as walking, bicycling, or exercising to workout videos.  Take stairs instead of the elevator.  Walk during your lunch break.  Park your car further away from work or school.  Go to a gym or an exercise class.  Start with 5 to 10 minutes of exercise each day. Build up to 30 minutes of exercise 4 to 6 days a week.  Wear shoes with good support and comfortable clothes.  Stretch before and after working out.  Work out until you breathe harder and your heart beats faster.  Drink extra water when you exercise.  Do not do so much that you hurt yourself, feel dizzy, or get very short of breath. Exercises that burn about 150 calories:  Running 1  miles in 15 minutes.  Playing volleyball for 45 to 60 minutes.  Washing and waxing a car for 45 to 60 minutes.  Playing touch football for 45 minutes.  Walking 1  miles in 35 minutes.  Pushing a stroller 1  miles in 30 minutes.  Playing basketball for 30 minutes.  Raking leaves for 30 minutes.  Bicycling 5 miles in 30 minutes.  Walking 2 miles in 30 minutes.  Dancing for 30 minutes.  Shoveling snow for 15 minutes.  Swimming laps for 20 minutes.  Walking up stairs for 15 minutes.  Bicycling 4 miles in 15 minutes.  Gardening for 30 to 45 minutes.  Jumping rope for 15 minutes.  Washing windows or floors for 45 to 60 minutes. Document Released: 02/07/2010 Document Revised: 03/30/2011 Document Reviewed: 02/07/2010 [facility] Patient Information 2014 [place], [place]. CPAP and BIPAP CPAP and BIPAP are methods of helping you breathe. CPAP stands for "continuous positive airway pressure." BIPAP stands for "bi-level positive airway pressure." Both CPAP and BIPAP are provided by a small machine with a flexible plastic tube that attaches to  a plastic mask that goes over your nose or mouth. Air is blown into your air passages through your nose or mouth. This helps to keep your airways open and helps to keep you breathing well. The amount of pressure that is used to blow the air into your air passages can be set on the machine. The pressure setting is based on your needs. With CPAP, the amount of pressure stays the same while you breathe in and out. With BIPAP, the amount of pressure changes when you inhale and exhale. Your caregiver will recommend whether CPAP or BIPAP would be more helpful for you.  CPAP and BIPAP can be helpful for both adults and children with:  Sleep apnea.  Chronic Obstructive Pulmonary Disease (COPD), a condition like emphysema.  Diseases which weaken the muscles of the chest such as muscular dystrophy or neurological diseases.  Other problems that cause breathing to be weak or difficult. USE OF CPAP OR BIPAP The respiratory therapist or technician will help you get used to wearing the mask. Some people feel claustrophobic (a trapped or closed in feeling) at first, because the mask needs to be fairly snug on your face.   It may help you to get used to the mask gradually, by first holding the mask loosely over your nose or mouth using a low pressure setting on the machine. Gradually the mask can be applied more snugly with increased pressure. You can also gradually increase the amount of time  the mask is used.  People with sleep apnea will use the mask and machine at night when they are sleeping. Others, like those with ALS or other breathing difficulties, may need the CPAP or BIPAP all the time.  If the first mask you try does not fit well, or is uncomfortable, there are other types and sizes that can be tried.  If you tend to breathe through your mouth, a chin strap may be applied to help keep your mouth closed (if you are using a nasal mask).  The CPAP and BIPAP machines have alarms that may sound if the  mask comes off or develops a leak.  You should not eat or drink while the CPAP or BIPAP is on. Food or fluids could get pushed into your lungs by the pressure of the CPAP or BIPAP. Sometimes CPAP or BIPAP machines are ordered for home use. If you are going to use the CPAP or BIPAP machine at home, follow these instructions  CPAP or BIPAP machines can be rented or purchased through home health care companies. There are many different brands of machines available. If you rent a machine before purchasing you may find which particular machine works well for you.  Ask questions if there is something you do not understand when picking out your machine.  Place your CPAP or BIPAP machine on a secure table or stand near an electrical outlet.  Know where the On/Off switch is.  Follow your doctor's instructions for how to set the pressure on your machine and when you should use it.  Do not smoke! Tobacco smoke residue can damage the machine. SEEK IMMEDIATE MEDICAL CARE IF:   You have redness or open areas around your nose or mouth.  You have trouble operating the CPAP or BIPAP machine.  You cannot tolerate wearing the CPAP or BIPAP mask.  You have any questions or concerns. Document Released: 10/04/2003 Document Revised: 03/30/2011 Document Reviewed: 01/03/2008 [facility] Patient Information 2014 [place], [place].

## 2012-06-16 NOTE — Telephone Encounter (Signed)
Sent to Dr. Vickey Huger, [place]

## 2012-06-24 ENCOUNTER — Encounter: Payer: BC Managed Care – PPO | Admitting: Internal Medicine

## 2012-07-29 ENCOUNTER — Encounter: Payer: Self-pay | Admitting: Neurology

## 2012-07-29 ENCOUNTER — Ambulatory Visit (INDEPENDENT_AMBULATORY_CARE_PROVIDER_SITE_OTHER): Payer: BC Managed Care – PPO | Admitting: Neurology

## 2012-07-29 VITALS — BP 129/82 | HR 83 | Resp 18 | Ht 68.0 in | Wt 239.0 lb

## 2012-07-29 DIAGNOSIS — E669 Obesity, unspecified: Secondary | ICD-10-CM

## 2012-07-29 DIAGNOSIS — G478 Other sleep disorders: Secondary | ICD-10-CM

## 2012-07-29 NOTE — Patient Instructions (Signed)
CPAP and BIPAP CPAP and BIPAP are methods of helping you breathe. CPAP stands for "continuous positive airway pressure." BIPAP stands for "bi-level positive airway pressure." Both CPAP and BIPAP are provided by a small machine with a flexible plastic tube that attaches to a plastic mask that goes over your nose or mouth. Air is blown into your air passages through your nose or mouth. This helps to keep your airways open and helps to keep you breathing well. The amount of pressure that is used to blow the air into your air passages can be set on the machine. The pressure setting is based on your needs. With CPAP, the amount of pressure stays the same while you breathe in and out. With BIPAP, the amount of pressure changes when you inhale and exhale. Your caregiver will recommend whether CPAP or BIPAP would be more helpful for you.  CPAP and BIPAP can be helpful for both adults and children with:  Sleep apnea.  Chronic Obstructive Pulmonary Disease (COPD), a condition like emphysema.  Diseases which weaken the muscles of the chest such as muscular dystrophy or neurological diseases.  Other problems that cause breathing to be weak or difficult. USE OF CPAP OR BIPAP The respiratory therapist or technician will help you get used to wearing the mask. Some people feel claustrophobic (a trapped or closed in feeling) at first, because the mask needs to be fairly snug on your face.   It may help you to get used to the mask gradually, by first holding the mask loosely over your nose or mouth using a low pressure setting on the machine. Gradually the mask can be applied more snugly with increased pressure. You can also gradually increase the amount of time the mask is used.  People with sleep apnea will use the mask and machine at night when they are sleeping. Others, like those with ALS or other breathing difficulties, may need the CPAP or BIPAP all the time.  If the first mask you try does not fit well, or  is uncomfortable, there are other types and sizes that can be tried.  If you tend to breathe through your mouth, a chin strap may be applied to help keep your mouth closed (if you are using a nasal mask).  The CPAP and BIPAP machines have alarms that may sound if the mask comes off or develops a leak.  You should not eat or drink while the CPAP or BIPAP is on. Food or fluids could get pushed into your lungs by the pressure of the CPAP or BIPAP. Sometimes CPAP or BIPAP machines are ordered for home use. If you are going to use the CPAP or BIPAP machine at home, follow these instructions  CPAP or BIPAP machines can be rented or purchased through home health care companies. There are many different brands of machines available. If you rent a machine before purchasing you may find which particular machine works well for you.  Ask questions if there is something you do not understand when picking out your machine.  Place your CPAP or BIPAP machine on a secure table or stand near an electrical outlet.  Know where the On/Off switch is.  Follow your doctor's instructions for how to set the pressure on your machine and when you should use it.  Do not smoke! Tobacco smoke residue can damage the machine. SEEK IMMEDIATE MEDICAL CARE IF:   You have redness or open areas around your nose or mouth.  You have trouble operating   the CPAP or BIPAP machine.  You cannot tolerate wearing the CPAP or BIPAP mask.  You have any questions or concerns. Document Released: 10/04/2003 Document Revised: 03/30/2011 Document Reviewed: 01/03/2008 [employer] Patient Information 2014 [employer]. Exercise to Lose Weight Exercise and a healthy diet may help you lose weight. Your doctor may suggest specific exercises. EXERCISE IDEAS AND TIPS  Choose low-cost things you enjoy doing, such as walking, bicycling, or exercising to workout videos.  Take stairs instead of the elevator.  Walk during your lunch  break.  Park your car further away from work or school.  Go to a gym or an exercise class.  Start with 5 to 10 minutes of exercise each day. Build up to 30 minutes of exercise 4 to 6 days a week.  Wear shoes with good support and comfortable clothes.  Stretch before and after working out.  Work out until you breathe harder and your heart beats faster.  Drink extra water when you exercise.  Do not do so much that you hurt yourself, feel dizzy, or get very short of breath. Exercises that burn about 150 calories:  Running 1  miles in 15 minutes.  Playing volleyball for 45 to 60 minutes.  Washing and waxing a car for 45 to 60 minutes.  Playing touch football for 45 minutes.  Walking 1  miles in 35 minutes.  Pushing a stroller 1  miles in 30 minutes.  Playing basketball for 30 minutes.  Raking leaves for 30 minutes.  Bicycling 5 miles in 30 minutes.  Walking 2 miles in 30 minutes.  Dancing for 30 minutes.  Shoveling snow for 15 minutes.  Swimming laps for 20 minutes.  Walking up stairs for 15 minutes.  Bicycling 4 miles in 15 minutes.  Gardening for 30 to 45 minutes.  Jumping rope for 15 minutes.  Washing windows or floors for 45 to 60 minutes. Document Released: 02/07/2010 Document Revised: 03/30/2011 Document Reviewed: 02/07/2010 [employer] Patient Information 2014 [employer].  

## 2012-07-29 NOTE — Progress Notes (Signed)
Chief Complaint  Patient presents with  . Follow-up    5 wk CPAP,rm 11   HPI: Diana [name] is a 57 y.o. female here as a referral from Dr. Nolen Mu. She used to follow Dr. Sandria Manly, who signed her CPAP orders, but she was never scheduled for a formal sleep evaluation.  Diana [name] underwent a split study on 9-28 2012 at the time she had presented to Dr. Nolen Mu is a chief complaint of excessive daytime sleepiness witnessed apneas and loud snoring. Her Epworth sleepiness score was 18 points and she and the patient used to CPAP prior to the titration 7 years and remarked that her Epworth was than 18 points and neck circumference measured 15 inches at the patient was titrated to 8 cm water.leepiness score on CPAP was 3/24 points. The patient a BMI of 31.2 at the time.  The patients husband had witnessed apneas and snoring when the patient Was no using CPAP. She had been placed on an auto-titrator before the SPLIT study and slept well with it. She tried changing to a different sleep setting of 9 cm water, and resumed afterwards her old automachine.  The patient never had a tonsillectomy, and in her youth on [place] used a Mining engineer for retrognathia.  She has no TMJ. She has a deformed anterior chest wall, Thorax excavatum.    Patient reports going to bed between 10 and 11 PM she rises at 8:30 in the morning. Her estimated nocturnal sleep time about 7 hours. He does not complain of nocturia, as no breakthrough snoring or witnessed apneas.  The patient's on the cardiology history is a patent foramen ovale, she has or a body weight of 239 pounds at 5 feet 8 inches. Since the last sleep study she first gained over 60 pounds and lost already 25.  College years the patient has never worked shift work again., She does not take daytime naps. The patient is using her 57 year old autotitrator machine.  She received a new one after her sleep study end of 2012 but was unable to tolerate it.  Fam.  history: Her father has a history of excessive daytime sleepiness loud snoring and she witnessed several times apneas , also he was never formally diagnosed. He lived to be 95.    The patient returns today on 07-29-12 for a followup visit  for auto set CPAP  Data. The patient used the old port titrator between 613 and 710. Average daily usage was 7 hours and 42 minutes, which is a very good result. Residual apnea was 0.9 or. She had very minor air leaks and her 95th percentile pressure on CPAP was 10 cm the window or for this Ultracet was between 7 and 15 cm water she endorsed the Epworth sleepiness score it only took points today. I would like for her to have the machine set at 10 cm water with a peak TR function. The patient has always been compliant with the CPAP but this is in [place] port at the CPAP actually has reduced her apnea to a single-digit orbital single-digit level. She's 100% compliant.  The patient's main symptom is upper airway resistance as  measured in an RDI of 10.6 after her baseline study.   Her husband reported no breakthrough snoring but has told her that she sometimes seems to be the regular but the patient herself has not awoken from this or it had not the feeling of choking.  The patient likes her current mask  and has no pressure marks or blisters requiring a change. There needs to be nor change and setting except primarily setting the machine to 10 cm water with a 3 cm EPR.     General: The patient is awake, alert and appears not in acute distress. The patient is well groomed. Head: Normocephalic, atraumatic. Neck is supple. Cardiovascular:  Regular rate and rhythm , without  murmurs or carotid bruit, and without distended neck veins. Retrognathia, TMJ.  Respiratory: Lungs are clear to auscultation. Skin:  Without evidence of edema, or rash Trunk: BMI is elevated .   Neurologic exam : The patient is awake and alert, oriented to place and time.  Memory subjective   described as intact. There is a normal attention span & concentration ability. Speech is fluent without dysarthria, dysphonia or aphasia. Mood and affect are appropriate.  Cranial nerves: Pupils are equal and briskly reactive to light. Extraocular movements  in vertical and horizontal planes intact and without nystagmus. Visual fields by finger perimetry are intact. Hearing to finger rub intact.  Facial sensation intact to fine touch. Facial motor strength is symmetric and tongue and uvula move midline.  Motor exam:  Normal tone and normal muscle bulk and symmetric normal strength in all extremities.  Sensory:  Fine touch, pinprick and vibration were tested in all extremities.   Coordination: Rapid alternating movements in the fingers/hands is tested and normal.  Gait and station: Patient walks without assistive device.  The patient's risk factors for upper airway resistance isn't from a related to the anatomy of the upper airway, Mallampati and retrognathia. A ventilation complement is related to the elevated B MI.   The patient did very well with the older titration. And I have sent already and order it to set the machine to 10 cm water with a 3 cm EPR not to change the mask. I will see the patient once a year her revisit will be scheduled for next summer. CPAP information, weight loss advised, and information about dental devices are provided.

## 2012-08-02 ENCOUNTER — Other Ambulatory Visit: Payer: Self-pay | Admitting: Internal Medicine

## 2012-08-03 ENCOUNTER — Encounter: Payer: Self-pay | Admitting: Neurology

## 2012-11-29 ENCOUNTER — Ambulatory Visit (INDEPENDENT_AMBULATORY_CARE_PROVIDER_SITE_OTHER): Payer: BC Managed Care – PPO | Admitting: Internal Medicine

## 2012-11-29 ENCOUNTER — Encounter: Payer: Self-pay | Admitting: Internal Medicine

## 2012-11-29 VITALS — BP 160/94 | HR 100 | Temp 98.7°F | Ht 68.0 in | Wt 240.0 lb

## 2012-11-29 DIAGNOSIS — J069 Acute upper respiratory infection, unspecified: Secondary | ICD-10-CM

## 2012-11-29 MED ORDER — CLARITHROMYCIN 500 MG PO TABS: 500.0000 mg | ORAL_TABLET | Freq: Two times a day (BID) | ORAL | Status: DC

## 2012-11-29 MED ORDER — HYDROCODONE-HOMATROPINE 5-1.5 MG/5ML PO SYRP: 5.0000 mL | ORAL_SOLUTION | Freq: Three times a day (TID) | ORAL | Status: DC | PRN

## 2012-12-04 NOTE — Patient Instructions (Signed)
Take antibiotics as directed and cough syrup as directed.

## 2012-12-04 NOTE — Progress Notes (Signed)
  Subjective:    Patient ID: Diana [name], female    DOB: Jun 05, 1955, 57 y.o.   MRN: [id number]  HPI Was out of town recently and came down with URI symptoms. Now has cough, congestion, and malaise.No shaking chills. Cough is productive. Some sore throat and sinus pressure.    Review of Systems     Objective:   Physical Exam TMs pink bilaterally and full . Neck supple. Pharynx slightly injected. No significant adenopathy. Chest clear to auscultation without rales or wheezing.        Assessment & Plan:  Acute upper respiratory infection  Plan: Biaxin 500 mg twice daily for 10 days. Hycodan 8 ounces 1 teaspoon by mouth every 6 hours when necessary cough.

## 2013-02-09 ENCOUNTER — Other Ambulatory Visit: Payer: BC Managed Care – PPO | Admitting: Internal Medicine

## 2013-02-10 ENCOUNTER — Encounter: Payer: BC Managed Care – PPO | Admitting: Internal Medicine

## 2013-02-14 ENCOUNTER — Other Ambulatory Visit: Payer: BC Managed Care – PPO | Admitting: Internal Medicine

## 2013-02-14 DIAGNOSIS — Z0000 Encounter for general adult medical examination without abnormal findings: Secondary | ICD-10-CM

## 2013-02-14 DIAGNOSIS — Z130 Encounter for screening for diseases of the blood and blood-forming organs and certain disorders involving the immune mechanism: Secondary | ICD-10-CM

## 2013-02-14 DIAGNOSIS — E559 Vitamin D deficiency, unspecified: Secondary | ICD-10-CM

## 2013-02-14 DIAGNOSIS — Z1329 Encounter for screening for other suspected endocrine disorder: Secondary | ICD-10-CM

## 2013-02-14 DIAGNOSIS — Z13220 Encounter for screening for lipoid disorders: Secondary | ICD-10-CM

## 2013-02-14 LAB — CBC WITH DIFFERENTIAL/PLATELET
Basophils Absolute: 0 10*3/uL (ref 0.0–0.1)
Basophils Relative: 1 % (ref 0–1)
EOS PCT: 6 % — AB (ref 0–5)
Eosinophils Absolute: 0.3 10*3/uL (ref 0.0–0.7)
HCT: 39.8 % (ref 36.0–46.0)
HEMOGLOBIN: 13.7 g/dL (ref 12.0–15.0)
LYMPHS ABS: 2 10*3/uL (ref 0.7–4.0)
LYMPHS PCT: 41 % (ref 12–46)
MCH: 29.5 pg (ref 26.0–34.0)
MCHC: 34.4 g/dL (ref 30.0–36.0)
MCV: 85.6 fL (ref 78.0–100.0)
MONOS PCT: 7 % (ref 3–12)
Monocytes Absolute: 0.4 10*3/uL (ref 0.1–1.0)
NEUTROS PCT: 45 % (ref 43–77)
Neutro Abs: 2.1 10*3/uL (ref 1.7–7.7)
PLATELETS: 199 10*3/uL (ref 150–400)
RBC: 4.65 MIL/uL (ref 3.87–5.11)
RDW: 13.3 % (ref 11.5–15.5)
WBC: 4.8 10*3/uL (ref 4.0–10.5)

## 2013-02-14 LAB — LIPID PANEL
CHOLESTEROL: 175 mg/dL (ref 0–200)
HDL: 36 mg/dL — ABNORMAL LOW (ref >39)
LDL Cholesterol: 110 mg/dL — ABNORMAL HIGH (ref 0–99)
Total CHOL/HDL Ratio: 4.9 Ratio
Triglycerides: 146 mg/dL (ref <150)
VLDL: 29 mg/dL (ref 0–40)

## 2013-02-14 LAB — COMPREHENSIVE METABOLIC PANEL
ALT: 26 U/L (ref 0–35)
AST: 24 U/L (ref 0–37)
Albumin: 4 g/dL (ref 3.5–5.2)
Alkaline Phosphatase: 72 U/L (ref 39–117)
BUN: 17 mg/dL (ref 6–23)
CALCIUM: 9.2 mg/dL (ref 8.4–10.5)
CHLORIDE: 99 meq/L (ref 96–112)
CO2: 29 meq/L (ref 19–32)
Creat: 0.76 mg/dL (ref 0.50–1.10)
Glucose, Bld: 92 mg/dL (ref 70–99)
Potassium: 4.2 mEq/L (ref 3.5–5.3)
SODIUM: 136 meq/L (ref 135–145)
TOTAL PROTEIN: 6.6 g/dL (ref 6.0–8.3)
Total Bilirubin: 0.7 mg/dL (ref 0.3–1.2)

## 2013-02-14 LAB — TSH: TSH: <0.008 u[IU]/mL — ABNORMAL LOW (ref 0.350–4.500)

## 2013-02-15 LAB — VITAMIN D 25 HYDROXY (VIT D DEFICIENCY, FRACTURES): VIT D 25 HYDROXY: 88 ng/mL (ref 30–89)

## 2013-02-17 ENCOUNTER — Encounter: Payer: Self-pay | Admitting: Internal Medicine

## 2013-02-17 ENCOUNTER — Ambulatory Visit (INDEPENDENT_AMBULATORY_CARE_PROVIDER_SITE_OTHER): Payer: BC Managed Care – PPO | Admitting: Internal Medicine

## 2013-02-17 VITALS — BP 126/88 | HR 100 | Temp 98.9°F | Ht 67.5 in | Wt 240.0 lb

## 2013-02-17 DIAGNOSIS — Z0000 Encounter for general adult medical examination without abnormal findings: Secondary | ICD-10-CM

## 2013-02-17 DIAGNOSIS — Z8639 Personal history of other endocrine, nutritional and metabolic disease: Secondary | ICD-10-CM

## 2013-02-17 DIAGNOSIS — Z8669 Personal history of other diseases of the nervous system and sense organs: Secondary | ICD-10-CM

## 2013-02-17 DIAGNOSIS — R946 Abnormal results of thyroid function studies: Secondary | ICD-10-CM

## 2013-02-17 DIAGNOSIS — Q2111 Secundum atrial septal defect: Secondary | ICD-10-CM

## 2013-02-17 DIAGNOSIS — F32A Depression, unspecified: Secondary | ICD-10-CM

## 2013-02-17 DIAGNOSIS — G479 Sleep disorder, unspecified: Secondary | ICD-10-CM

## 2013-02-17 DIAGNOSIS — Q211 Atrial septal defect: Secondary | ICD-10-CM

## 2013-02-17 DIAGNOSIS — F3289 Other specified depressive episodes: Secondary | ICD-10-CM

## 2013-02-17 DIAGNOSIS — Z85820 Personal history of malignant melanoma of skin: Secondary | ICD-10-CM

## 2013-02-17 DIAGNOSIS — R7989 Other specified abnormal findings of blood chemistry: Secondary | ICD-10-CM

## 2013-02-17 DIAGNOSIS — F329 Major depressive disorder, single episode, unspecified: Secondary | ICD-10-CM

## 2013-02-17 LAB — POCT URINALYSIS DIPSTICK
BILIRUBIN UA: NEGATIVE
GLUCOSE UA: NEGATIVE
KETONES UA: NEGATIVE
Leukocytes, UA: NEGATIVE
NITRITE UA: NEGATIVE
Protein, UA: NEGATIVE
RBC UA: NEGATIVE
Urobilinogen, UA: NEGATIVE
pH, UA: 5.5

## 2013-02-17 MED ORDER — SUMATRIPTAN SUCCINATE 100 MG PO TABS: 100.0000 mg | ORAL_TABLET | Freq: Once | ORAL | Status: DC

## 2013-02-17 NOTE — Patient Instructions (Addendum)
Stop Armour Thyroid. Return for TSH and free T4 in 6 weeks. Imitrex refilled. Will work on cardiology referral to cardiologist as you requested. Return in one year or as needed.

## 2013-02-17 NOTE — Progress Notes (Signed)
Subjective:    Patient ID: Diana [name], female    DOB: 08-05-1955, 58 y.o.   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 History of migraine headaches for which she takes generic Imitrex. A while back she was feeling bad and went to an alternative medical office and saw a nurse practitioner, Carroll Kinds. Was put on Armour Thyroid. TSH is quite low on this medication. Says she felt better on Armour Thyroid and got more energy. Dr. Cheri Fowler is GYN physician. She saw him summer 2014. Mammogram due this coming spring. Colonoscopy done 2011 by Dr. Olevia Perches. 10 year followup recommended.  Patient had Pneumovax immunization 2002.  No known drug allergies.  History of vitamin D deficiency.  History of sleep disorder. Had sleep study by Dr. Annamaria Boots 2006. He felt she did not qualify for CPAP. History of loud snoring.  Patient hospitalized in 2007 with volume depletion, viral syndrome and selective serotonin reuptake inhibitor discontinuation syndrome. Had upper endoscopy by Dr. Deatra Ina at that time which was normal.  Patient saw Dr. Doreatha Lew prior to his retirement in 2009. Was diagnosed with ASD in 2001 at [facility] pediatric cardiology clinic in [place]. and was advised to have it followed medically. At that time she had no cardiac symptoms but occasional shortness of breath which was subtle. No chest pain. Has not seen cardiologist in some time. Apparently was a small ASD felt to be secundum ASD with left-to-right shunting by color flow Doppler. Subsequently had 2-D echocardiogram by Dr. Doreatha Lew 05/25/2007 showing normal LV systolic function. No segmental wall motion abnormalities. Left atrium normal. Right atrium normal. No aortic stenosis. No mitral valve prolapse. Normal tricuspid valve. Normal pulmonic valve. Very small ASD visualized. Interpretation was normal LV systolic function  with impaired LV relaxation. Normal right-sided chamber size and function. SBE prophylaxis recommended in 2001.  History of ? pseudotumor cerebri 2008. Saw Dr. love in 2008 Was thought to have papilledema versus pseudopapilledema without significant elevated opening pressures a lumbar puncture. Lumbar puncture was done in 2008.  Long-standing history of obesity.  Social history: Drinks maybe one alcoholic drink per month and no more than one a week. No children. Has never been pregnant. Tried infertility treatments without success. Does not smoke.  Has been treated withd for long-standing history of depression. Previously took Luvox. Has been treated in the past with Topamax for migraine headaches.  Saw Dr. Annamaria Boots regarding sleep apnea 2006. At that time she had normal sleep study. Had mild periodic limb movement with arousal. He felt she had some degree of sleep-disordered breathing/upper where way resistance but did not qualify as obstructive sleep apnea syndrome. He felt CPAP would not be paid for by insurance. History of snoring.  Family history: Mother with history of migraine headache. Father died at age 95 with history of stroke and congestive heart failure. One brother in good health. Mother died at age 68 with a colonic infarction but had a history of Alzheimer's disease.  In 2012 she had malignant melanoma removed from left thigh. Was treated at [facility] and had lymph node sampling. Followed by Dr. Ubaldo Glassing. Lesion was Clark level IV.  History of basal cell carcinoma back 2012.   Review of Systems  Constitutional: Positive for fatigue.  HENT:       History of allergic rhinitis  Eyes: Negative.   Respiratory: Negative.   Cardiovascular: Negative for palpitations.  Some recent chest pain which seemed to be musculoskeletal in nature and that has resolved  Endocrine:       On thyroid replacement  Allergic/Immunologic: Positive for environmental allergies.  Neurological:        History of migraine headaches  Hematological: Negative.   Psychiatric/Behavioral:       History of depression       Objective:   Physical Exam  Vitals reviewed. Constitutional: She is oriented to person, place, and time. She appears well-developed and well-nourished. No distress.  HENT:  Head: Normocephalic and atraumatic.  Right Ear: External ear normal.  Left Ear: External ear normal.  Mouth/Throat: Oropharynx is clear and moist. No oropharyngeal exudate.  Eyes: Conjunctivae and EOM are normal. Right eye exhibits no discharge. Left eye exhibits no discharge. No scleral icterus.  Neck: Neck supple. No JVD present. No thyromegaly present.  Cardiovascular: Normal rate, regular rhythm, normal heart sounds and intact distal pulses.   No murmur heard. Pulmonary/Chest: Effort normal and breath sounds normal. She has no wheezes. She has no rales. She exhibits no tenderness.  Breasts normal female  Abdominal: Soft. Bowel sounds are normal. She exhibits no distension and no mass. There is no tenderness. There is no rebound and no guarding.  Genitourinary:  Deferred to GYN  Musculoskeletal: Normal range of motion. She exhibits no edema.  Lymphadenopathy:    She has no cervical adenopathy.  Neurological: She is alert and oriented to person, place, and time. She has normal reflexes. She displays normal reflexes. No cranial nerve deficit. Coordination normal.  Skin: Skin is warm and dry. No rash noted. She is not diaphoretic.  Psychiatric: She has a normal mood and affect. Her behavior is normal. Judgment and thought content normal.          Assessment & Plan:  Abnormal TSH. Stop Armour Thyroid repeat TSH in 6 weeks.  History of vitamin D deficiency-vitamin D level is now normal at 88 but is high normal.,but she's taking large amounts of vitamin D supplementation. Recommend 5000 units weekly. Says that when she previously took over-the-counter D3 was not well absorbed.  History of  depression-stable with SSRI  History of migraine headaches-Imitrex refilled  History of sleep disorder  History small secundum ASD-patient will see cardiologist  History of depression-stable with generic Effexor  History malignant melanoma Clark level IV followed by Dr. Ubaldo Glassing  Plan: Appointment with cardiologist. Refill Imitrex. Repeat TSH in 6 weeks off Armour Thyroid.

## 2013-02-21 ENCOUNTER — Other Ambulatory Visit: Payer: Self-pay

## 2013-02-21 ENCOUNTER — Telehealth: Payer: Self-pay

## 2013-02-21 DIAGNOSIS — Q211 Atrial septal defect, unspecified: Secondary | ICD-10-CM

## 2013-02-21 DIAGNOSIS — Z1231 Encounter for screening mammogram for malignant neoplasm of breast: Secondary | ICD-10-CM

## 2013-02-21 NOTE — Telephone Encounter (Signed)
Appointment with Dr. Sherren Mocha at [facility] scheduled for 04/02/2013. Patient informed.

## 2013-03-23 ENCOUNTER — Ambulatory Visit (INDEPENDENT_AMBULATORY_CARE_PROVIDER_SITE_OTHER): Payer: BC Managed Care – PPO | Admitting: Cardiovascular Disease

## 2013-03-23 ENCOUNTER — Encounter (INDEPENDENT_AMBULATORY_CARE_PROVIDER_SITE_OTHER): Payer: Self-pay

## 2013-03-23 ENCOUNTER — Encounter: Payer: Self-pay | Admitting: Cardiovascular Disease

## 2013-03-23 VITALS — BP 154/98 | HR 89 | Ht 67.5 in | Wt 255.4 lb

## 2013-03-23 DIAGNOSIS — Q211 Atrial septal defect, unspecified: Secondary | ICD-10-CM

## 2013-03-23 DIAGNOSIS — Q2111 Secundum atrial septal defect: Secondary | ICD-10-CM

## 2013-03-23 MED ORDER — ASPIRIN EC 81 MG PO TBEC: 81.0000 mg | DELAYED_RELEASE_TABLET | Freq: Every day | ORAL | Status: DC

## 2013-03-23 NOTE — Patient Instructions (Addendum)
Your physician has recommended you make the following change in your medication:  START Aspirin 81 mg once daily  Your physician has requested that you have an echocardiogram. Echocardiography is a painless test that uses sound waves to create images of your heart. It provides your doctor with information about the size and shape of your heart and how well your heart's chambers and valves are working. This procedure takes approximately one hour. There are no restrictions for this procedure. Patient states she will call back to schedule echo  Your physician wants you to follow-up in: 1 year with Dr. Burt Knack.  You will receive a reminder letter in the mail two months in advance. If you don't receive a letter, please call our office to schedule the follow-up appointment.

## 2013-03-23 NOTE — Progress Notes (Signed)
HPI:   58 year old woman presenting for cardiology followup evaluation. She has been seen by Dr. Doreatha Lew in the past. 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  The patient was diagnosed with a small ASD about 14 years ago. Most of her evaluation was done at [facility]. She ultimately underwent a transesophageal echocardiogram and was told that her ASD was small and could be managed medically. She had a cardiac MRI done in 2009 and demonstrated no evidence of right-sided chamber enlargement, nor was there any evidence of significant shunting. The ASD was not visualized.  The patient has no cardiac related complaints. She specifically denies chest pain, chest pressure, dyspnea, edema, palpitations, orthopnea, or PND.  Her blood pressure at home ranges from 120-130/80. She has a long-standing history of white coat hypertension.  Outpatient Encounter Prescriptions as of 03/23/2013  Medication Sig  . cetirizine (ZYRTEC) 10 MG tablet Take 10 mg by mouth daily.    . Cholecalciferol (VITAMIN D PO) Take 5,000 mg by mouth every 7 (seven) days.  Marland Kitchen desvenlafaxine (PRISTIQ) 50 MG 24 hr tablet Take 50 mg by mouth daily.    . SUMAtriptan (IMITREX) 100 MG tablet Take 1 tablet (100 mg total) by mouth once. May repeat in 2 hours if headache persists or recurs.  . [DISCONTINUED] DHEA 10 MG TABS Take by mouth 3 x daily with food.  . [DISCONTINUED] EST ESTROGENS-METHYLTEST PO Take by mouth. Every three months  . [DISCONTINUED] NALTREXONE HCL PO Take 3 mg by mouth at bedtime.  . [DISCONTINUED] progesterone (PROMETRIUM) 100 MG capsule Take 100 mg by mouth at bedtime.  . [DISCONTINUED] thyroid (ARMOUR THYROID) 180 MG tablet Take 180 mg by mouth daily.  . [DISCONTINUED] VITAMIN D, CHOLECALCIFEROL, PO Take 25,000 Units by mouth. One pill every other day    Allergies  Allergen Reactions  . Amoxicillin Nausea And Vomiting    Past Medical History  Diagnosis Date  . Atrial septal  defect     Small,non significant atrial septal defect with QP/QS of 1.25 by MRA.  . Obesity   . Statin intolerance     with elevated CRP  . Pseudotumor cerebri Diagnosed in March 2008-    She sees Dr Clovis Riley in this regard  . Sleep apnea   . Obesity   . Migraine headache   . Anxiety     controlled  . Migraine     occasionally  . Obstructive apnea     CPAP user since 2012.     ROS: Negative except as per HPI  BP 154/98  Pulse 89  Ht 5' 7.5" (1.715 m)  Wt 255 lb 6.4 oz (115.849 kg)  BMI 39.39 kg/m2  PHYSICAL EXAM: Pt is alert and oriented, pleasant obese woman in NAD HEENT: normal Neck: JVP - normal, carotids 2+= without bruits Lungs: CTA bilaterally CV: RRR without murmur or gallop Abd: soft, NT, Positive BS, no hepatomegaly Ext: no C/C/E, distal pulses intact and equal Skin: warm/dry no rash  EKG:  Sinus rhythm with RV conduction delay. Possible left atrial enlargement.  ASSESSMENT AND PLAN: 58 year old woman with history of a small secundum ASD. Her cardiac MRI from 2009 was reviewed. This demonstrated normal cardiac chamber size. She's had no further cardiac imaging over the past 6 years. I've recommended that she have a 2-D echocardiogram to reevaluate for any evidence of significant right-sided chamber enlargement. Her exam is essentially unremarkable and I suspect her ASD is very small. I will obtain previous  records. I advised that the patient start aspirin 81 mg daily for prevention of potential thromboembolism. She will return for one year followup. We discussed the importance of weight and lifestyle modification.

## 2013-03-27 ENCOUNTER — Ambulatory Visit
Admission: RE | Admit: 2013-03-27 | Discharge: 2013-03-27 | Disposition: A | Discharge status: Home / Self Care | Payer: BC Managed Care – PPO | Source: Ambulatory Visit

## 2013-03-27 DIAGNOSIS — Z1231 Encounter for screening mammogram for malignant neoplasm of breast: Secondary | ICD-10-CM

## 2013-04-04 ENCOUNTER — Other Ambulatory Visit: Payer: BC Managed Care – PPO | Admitting: Internal Medicine

## 2013-04-04 DIAGNOSIS — R6889 Other general symptoms and signs: Secondary | ICD-10-CM

## 2013-04-04 LAB — T4, FREE: FREE T4: 0.93 ng/dL (ref 0.80–1.80)

## 2013-04-04 LAB — TSH: TSH: 2.95 u[IU]/mL (ref 0.350–4.500)

## 2013-04-14 ENCOUNTER — Ambulatory Visit (HOSPITAL_COMMUNITY): Payer: BC Managed Care – PPO | Attending: Cardiovascular Disease | Admitting: Radiology

## 2013-04-14 DIAGNOSIS — Q2111 Secundum atrial septal defect: Secondary | ICD-10-CM

## 2013-04-14 DIAGNOSIS — Q211 Atrial septal defect, unspecified: Secondary | ICD-10-CM

## 2013-04-14 NOTE — Progress Notes (Signed)
Echocardiogram performed.  

## 2013-08-25 ENCOUNTER — Ambulatory Visit: Payer: BC Managed Care – PPO | Admitting: Neurology

## 2013-08-30 ENCOUNTER — Telehealth: Payer: Self-pay | Admitting: *Deleted

## 2013-08-30 NOTE — Telephone Encounter (Signed)
Left a message for the patient to call the office about changing her appointment from Dr. Brett Fairy to [place].

## 2013-08-31 NOTE — Telephone Encounter (Signed)
Patient was willing to switch but it had to be at the same time and there was not anything available, so she will keep her appointment with Dr. Brett Fairy.

## 2013-08-31 NOTE — Telephone Encounter (Signed)
Patient returning call to Hinton Dyer, please call her back at work at [phone] and ask to speak with [name].

## 2013-09-01 ENCOUNTER — Encounter: Payer: Self-pay | Admitting: Neurology

## 2013-09-01 ENCOUNTER — Ambulatory Visit (INDEPENDENT_AMBULATORY_CARE_PROVIDER_SITE_OTHER): Payer: BC Managed Care – PPO | Admitting: Neurology

## 2013-09-01 DIAGNOSIS — Z9989 Dependence on other enabling machines and devices: Secondary | ICD-10-CM

## 2013-09-01 DIAGNOSIS — R0683 Snoring: Secondary | ICD-10-CM

## 2013-09-01 DIAGNOSIS — G4733 Obstructive sleep apnea (adult) (pediatric): Secondary | ICD-10-CM | POA: Insufficient documentation

## 2013-09-01 DIAGNOSIS — R0609 Other forms of dyspnea: Secondary | ICD-10-CM

## 2013-09-01 DIAGNOSIS — R0989 Other specified symptoms and signs involving the circulatory and respiratory systems: Secondary | ICD-10-CM

## 2013-09-01 NOTE — Progress Notes (Signed)
Chief Complaint  Patient presents with  . Follow-up    sleep   HPI:  Interval history :   Established patient , formerly of Dr Diana [name],  seen today for a one-year checkup on CPAP compliance. The patient has a history of migraines controlled on Imitrex, just taking vitamin D, Zyrtec for rhinitis.  She had been diagnosed with sleep apnea and has been using CPAP she brought the memory chip of her CPAP machine today to the office, but unfortunately the  software system is struggling to load the data.     She endorsed the FSS 27, Epworth score at 3 from 18 /24 prior to CPAP use.   She goes to bed at about 11 Pm and rises at 6 .30, getting about 7 hours of sleep,  No nocturia.        Diana [name] is a 58 y.o. female here as a referral from Dr. Caprice [name]. She used to follow Dr. Erling [name], who signed her CPAP orders, but she was never scheduled for a formal sleep evaluation.  Diana [name] underwent a split study on 9-28 2012.  At the time she had presented to Dr. Caprice [name] with a chief complaint of excessive daytime sleepiness,  witnessed apneas and loud snoring.  Her Epworth sleepiness score was 18/ 24  points and she and the patient used to CPAP prior to the titration 7 years (r Epworth was than 18 points and neck circumference measured 15 inches)  The  patient was titrated to 8 cm water. Epworth sleepiness score on CPAP was 3/24 points.  The patient a BMI of 31.2 at the time.  The patients husband had witnessed apneas and snoring when the patient was not using CPAP.    07-29-12 for a followup visit for auto set CPAP  Data.The patient used the old port titrator between 613 and 710. Average daily usage was 7 hours and 42 minutes, which is a very good result. Residual apnea was 0.9 or. She had very minor air leaks and her 95th percentile pressure on CPAP was 10 cm the window or for this Ultracet was between 7 and 15 cm water she endorsed the Epworth sleepiness score it only took points today. I  would like for her to have the machine set at 10 cm water with a peak TR function. The patient has always been compliant with the CPAP but this is in [place] port at the CPAP actually has reduced her apnea to a single-digit orbital single-digit level. She's 100% compliant.  She had been placed on an auto-titrator before the SPLIT study and slept well with it. She tried changing to a different sleep setting of 9 cm water, and resumed afterwards her old autoset CPAP machine.  The patient never had a tonsillectomy. During her youth on [place] used a Health and safety inspector for retrognathia.  She has no TMJ. She has a deformed anterior chest wall, Thorax excavatum.   Patient reports going to bed between 10 and 11 PM she rises at 8:30 in the morning. Her estimated nocturnal sleep time about 7 hours. He does not complain of nocturia, as no breakthrough snoring or witnessed apneas.  The patient's on the cardiology history is a patent foramen ovale, she has or a body weight of 239 pounds at 5 feet 8 inches. Since the last sleep study she first gained over 60 pounds and lost already 25.  College years the patient has never worked shift work again., She does not take daytime naps.  The patient is using her 60 year old autotitrator machine.  She received a new one after her sleep study end of 2012 but was unable to tolerate it.  Fam. history: Her father has a history of excessive daytime sleepiness loud snoring and she witnessed several times apneas , also he was never formally diagnosed. He lived to be 95.        Her husband reported no breakthrough snoring but has told her that she sometimes seems to be the regular but the patient herself has not awoken from this or it had not the feeling of choking.  The patient likes her current mask and has no pressure marks or blisters requiring a change. There needs to be nor change and setting except primarily setting the machine to 10 cm water with a 3 cm  EPR.     General: The patient is awake, alert and appears not in acute distress. The patient is well groomed. Head: Normocephalic, atraumatic. Neck is supple. Cardiovascular:  Regular rate and rhythm , without  murmurs or carotid bruit, and without distended neck veins. Retrognathia, TMJ.  Respiratory: Lungs are clear to auscultation. Skin:  Without evidence of edema, or rash Trunk: BMI is elevated .   Neurologic exam : The patient is awake and alert, oriented to place and time.  Memory subjective  described as intact. There is a normal attention span & concentration ability. Speech is fluent without dysarthria, dysphonia or aphasia. Mood and affect are appropriate.  Cranial nerves: Pupils are equal and briskly reactive to light. Extraocular movements  in vertical and horizontal planes intact and without nystagmus. Visual fields by finger perimetry are intact. Hearing to finger rub intact.  Facial sensation intact to fine touch. Facial motor strength is symmetric and tongue and uvula move midline.  Motor exam:  Normal tone and  muscle bulk and symmetric normal strength in all extremities.  Sensory:  Fine touch, pinprick and vibration were tested in all extremities.   Coordination: Rapid alternating movements in the fingers/hands is tested and normal.  Gait and station: Patient walks without assistive device.  The patient did very well with CPAP and is highly compliant , she needs to use CPAP at auto-titration between 8 and 18 cm water.  Nasal mask.

## 2013-09-01 NOTE — Patient Instructions (Addendum)
Sleep Apnea  Sleep apnea is a sleep disorder characterized by abnormal pauses in breathing while you sleep. When your breathing pauses, the level of oxygen in your blood decreases. This causes you to move out of deep sleep and into light sleep. As a result, your quality of sleep is poor, and the system that carries your blood throughout your body (cardiovascular system) experiences stress. If sleep apnea remains untreated, the following conditions can develop:  High blood pressure (hypertension).  Coronary artery disease.  Inability to achieve or maintain an erection (impotence).  Impairment of your thought process (cognitive dysfunction). There are three types of sleep apnea: 1. Obstructive sleep apnea--Pauses in breathing during sleep because of a blocked airway. 2. Central sleep apnea--Pauses in breathing during sleep because the area of the brain that controls your breathing does not send the correct signals to the muscles that control breathing. 3. Mixed sleep apnea--A combination of both obstructive and central sleep apnea. RISK FACTORS The following risk factors can increase your risk of developing sleep apnea:  Being overweight.  Smoking.  Having narrow passages in your nose and throat.  Being of older age.  Being female.  Alcohol use.  Sedative and tranquilizer use.  Ethnicity. Among individuals younger than 35 years, African Americans are at increased risk of sleep apnea. SYMPTOMS   Difficulty staying asleep.  Daytime sleepiness and fatigue.  Loss of energy.  Irritability.  Loud, heavy snoring.  Morning headaches.  Trouble concentrating.  Forgetfulness.  Decreased interest in sex. DIAGNOSIS  In order to diagnose sleep apnea, your caregiver will perform a physical examination. Your caregiver may suggest that you take a home sleep test. Your caregiver may also recommend that you spend the night in a sleep lab. In the sleep lab, several monitors record  information about your heart, lungs, and brain while you sleep. Your leg and arm movements and blood oxygen level are also recorded. TREATMENT The following actions may help to resolve mild sleep apnea:  Sleeping on your side.   Using a decongestant if you have nasal congestion.   Avoiding the use of depressants, including alcohol, sedatives, and narcotics.   Losing weight and modifying your diet if you are overweight. There also are devices and treatments to help open your airway:  Oral appliances. These are custom-made mouthpieces that shift your lower jaw forward and slightly open your bite. This opens your airway.  Devices that create positive airway pressure. This positive pressure "splints" your airway open to help you breathe better during sleep. The following devices create positive airway pressure:  Continuous positive airway pressure (CPAP) device. The CPAP device creates a continuous level of air pressure with an air pump. The air is delivered to your airway through a mask while you sleep. This continuous pressure keeps your airway open.  Nasal expiratory positive airway pressure (EPAP) device. The EPAP device creates positive air pressure as you exhale. The device consists of single-use valves, which are inserted into each nostril and held in place by adhesive. The valves create very little resistance when you inhale but create much more resistance when you exhale. That increased resistance creates the positive airway pressure. This positive pressure while you exhale keeps your airway open, making it easier to breath when you inhale again.  Bilevel positive airway pressure (BPAP) device. The BPAP device is used mainly in patients with central sleep apnea. This device is similar to the CPAP device because it also uses an air pump to deliver continuous air pressure   through a mask. However, with the BPAP machine, the pressure is set at two different levels. The pressure when you  exhale is lower than the pressure when you inhale.  Surgery. Typically, surgery is only done if you cannot comply with less invasive treatments or if the less invasive treatments do not improve your condition. Surgery involves removing excess tissue in your airway to create a wider passage way. Document Released: 12/26/2001 Document Revised: 05/02/2012 Document Reviewed: 05/14/2011 [facility] Patient Information 2015 [place], [place]. This information is not intended to replace advice given to you by your health care provider. Make sure you discuss any questions you have with your health care provider.   [facility] allow  your machine  An auto set pressure window between 8 and 18 cm water pressure.  Since you're on this machine already we will use a 30 day average download to either set ,  the machine or decide to keep it on upper titration. Please call me at the [phone] and give me a preformed report about how you do under the auto setting. Marland Kitchen

## 2013-10-12 ENCOUNTER — Telehealth: Payer: Self-pay | Admitting: Internal Medicine

## 2013-10-12 NOTE — Telephone Encounter (Signed)
We do not give these here. She may get an order from [place] and go to [facility]

## 2013-10-12 NOTE — Telephone Encounter (Signed)
Is it ok to order shingles vaccine for patient?

## 2013-10-12 NOTE — Telephone Encounter (Signed)
I will write a written order for her to take to pharmacy.

## 2013-10-12 NOTE — Telephone Encounter (Signed)
Pt called and would like to get the shingles injection.  She will self-pay if insurance does not cover.  [facility]   Best number to call pt is [phone]

## 2013-10-12 NOTE — Telephone Encounter (Signed)
Left message informing patient to come to office to pick up rx for shingles vaccine.

## 2013-10-23 ENCOUNTER — Encounter: Payer: Self-pay | Admitting: Neurology

## 2013-11-15 ENCOUNTER — Encounter: Payer: Self-pay | Admitting: Internal Medicine

## 2013-12-06 ENCOUNTER — Ambulatory Visit (INDEPENDENT_AMBULATORY_CARE_PROVIDER_SITE_OTHER): Payer: BC Managed Care – PPO | Admitting: Family Medicine

## 2013-12-06 VITALS — BP 128/90 | HR 98 | Temp 97.8°F | Resp 18 | Ht 67.75 in | Wt 279.0 lb

## 2013-12-06 DIAGNOSIS — R531 Weakness: Secondary | ICD-10-CM

## 2013-12-06 DIAGNOSIS — N3090 Cystitis, unspecified without hematuria: Secondary | ICD-10-CM

## 2013-12-06 DIAGNOSIS — E162 Hypoglycemia, unspecified: Secondary | ICD-10-CM

## 2013-12-06 DIAGNOSIS — R3915 Urgency of urination: Secondary | ICD-10-CM

## 2013-12-06 LAB — POCT URINALYSIS DIPSTICK
BILIRUBIN UA: NEGATIVE
GLUCOSE UA: NEGATIVE
Ketones, UA: NEGATIVE
Nitrite, UA: NEGATIVE
Protein, UA: NEGATIVE
RBC UA: NEGATIVE
SPEC GRAV UA: 1.015
Urobilinogen, UA: 0.2
pH, UA: 7

## 2013-12-06 LAB — POCT UA - MICROSCOPIC ONLY
CRYSTALS, UR, HPF, POC: NEGATIVE
Casts, Ur, LPF, POC: NEGATIVE
Mucus, UA: NEGATIVE
Yeast, UA: NEGATIVE

## 2013-12-06 LAB — GLUCOSE, POCT (MANUAL RESULT ENTRY): POC Glucose: 83 mg/dl (ref 70–99)

## 2013-12-06 MED ORDER — NITROFURANTOIN MONOHYD MACRO 100 MG PO CAPS: 100.0000 mg | ORAL_CAPSULE | Freq: Two times a day (BID) | ORAL | Status: DC

## 2013-12-06 NOTE — Patient Instructions (Addendum)
Cannot prove one way or the other whether he had a hypoglycemic spell.if you have further symptoms please return. I do not feel like other evaluation is indicated at this time. If you ever have a sensation similar when you are driving, make sure you pull off the road immediately.  We will treat you for a probable UTI, though the tests are a little bit equivocal. We are running a culture.  Macrobid twice a day for 5 days  Urinary Tract Infection Urinary tract infections (UTIs) can develop anywhere along your urinary tract. Your urinary tract is your body's drainage system for removing wastes and extra water. Your urinary tract includes two kidneys, two ureters, a bladder, and a urethra. Your kidneys are a pair of bean-shaped organs. Each kidney is about the size of your fist. They are located below your ribs, one on each side of your spine. CAUSES Infections are caused by microbes, which are microscopic organisms, including fungi, viruses, and bacteria. These organisms are so small that they can only be seen through a microscope. Bacteria are the microbes that most commonly cause UTIs. SYMPTOMS  Symptoms of UTIs may vary by age and gender of the patient and by the location of the infection. Symptoms in young women typically include a frequent and intense urge to urinate and a painful, burning feeling in the bladder or urethra during urination. Older women and men are more likely to be tired, shaky, and weak and have muscle aches and abdominal pain. A fever may mean the infection is in your kidneys. Other symptoms of a kidney infection include pain in your back or sides below the ribs, nausea, and vomiting. DIAGNOSIS To diagnose a UTI, your caregiver will ask you about your symptoms. Your caregiver also will ask to provide a urine sample. The urine sample will be tested for bacteria and white blood cells. White blood cells are made by your body to help fight infection. TREATMENT  Typically, UTIs can be  treated with medication. Because most UTIs are caused by a bacterial infection, they usually can be treated with the use of antibiotics. The choice of antibiotic and length of treatment depend on your symptoms and the type of bacteria causing your infection. HOME CARE INSTRUCTIONS  If you were prescribed antibiotics, take them exactly as your caregiver instructs you. Finish the medication even if you feel better after you have only taken some of the medication.  Drink enough water and fluids to keep your urine clear or pale yellow.  Avoid caffeine, tea, and carbonated beverages. They tend to irritate your bladder.  Empty your bladder often. Avoid holding urine for long periods of time.  Empty your bladder before and after sexual intercourse.  After a bowel movement, women should cleanse from front to back. Use each tissue only once. SEEK MEDICAL CARE IF:   You have back pain.  You develop a fever.  Your symptoms do not begin to resolve within 3 days. SEEK IMMEDIATE MEDICAL CARE IF:   You have severe back pain or lower abdominal pain.  You develop chills.  You have nausea or vomiting.  You have continued burning or discomfort with urination. MAKE SURE YOU:   Understand these instructions.  Will watch your condition.  Will get help right away if you are not doing well or get worse. Document Released: 10/15/2004 Document Revised: 07/07/2011 Document Reviewed: 02/13/2011 [facility] Patient Information 2015 [place], [place]. This information is not intended to replace advice given to you by your health care  provider. Make sure you discuss any questions you have with your health care provider.  Hypoglycemia Hypoglycemia occurs when the glucose in your blood is too low. Glucose is a type of sugar that is your body's main energy source. Hormones, such as insulin and glucagon, control the level of glucose in the blood. Insulin lowers blood glucose and glucagon increases blood glucose.  Having too much insulin in your blood stream, or not eating enough food containing sugar, can result in hypoglycemia. Hypoglycemia can happen to people with or without diabetes. It can develop quickly and can be a medical emergency.  CAUSES   Missing or delaying meals.  Not eating enough carbohydrates at meals.  Taking too much diabetes medicine.  Not timing your oral diabetes medicine or insulin doses with meals, snacks, and exercise.  Nausea and vomiting.  Certain medicines.  Severe illnesses, such as hepatitis, kidney disorders, and certain eating disorders.  Increased activity or exercise without eating something extra or adjusting medicines.  Drinking too much alcohol.  A nerve disorder that affects body functions like your heart rate, blood pressure, and digestion (autonomic neuropathy).  A condition where the stomach muscles do not function properly (gastroparesis). Therefore, medicines and food may not absorb properly.  Rarely, a tumor of the pancreas can produce too much insulin. SYMPTOMS   Hunger.  Sweating (diaphoresis).  Change in body temperature.  Shakiness.  Headache.  Anxiety.  Lightheadedness.  Irritability.  Difficulty concentrating.  Dry mouth.  Tingling or numbness in the hands or feet.  Restless sleep or sleep disturbances.  Altered speech and coordination.  Change in mental status.  Seizures or prolonged convulsions.  Combativeness.  Drowsiness (lethargic).  Weakness.  Increased heart rate or palpitations.  Confusion.  Pale, gray skin color.  Blurred or double vision.  Fainting. DIAGNOSIS  A physical exam and medical history will be performed. Your caregiver may make a diagnosis based on your symptoms. Blood tests and other lab tests may be performed to confirm a diagnosis. Once the diagnosis is made, your caregiver will see if your signs and symptoms go away once your blood glucose is raised.  TREATMENT  Usually, you  can easily treat your hypoglycemia when you notice symptoms.  Check your blood glucose. If it is less than 70 mg/dl, take one of the following:   3-4 glucose tablets.    cup juice.    cup regular soda.   1 cup skim milk.   -1 tube of glucose gel.   5-6 hard candies.   Avoid high-fat drinks or food that may delay a rise in blood glucose levels.  Do not take more than the recommended amount of sugary foods, drinks, gel, or tablets. Doing so will cause your blood glucose to go too high.   Wait 10-15 minutes and recheck your blood glucose. If it is still less than 70 mg/dl or below your target range, repeat treatment.   Eat a snack if it is more than 1 hour until your next meal.  There may be a time when your blood glucose may go so low that you are unable to treat yourself at home when you start to notice symptoms. You may need someone to help you. You may even faint or be unable to swallow. If you cannot treat yourself, someone will need to bring you to the hospital.  [place]  If you have diabetes, follow your diabetes management plan by:  Taking your medicines as directed.  Following your exercise plan.  Following your meal plan. Do not skip meals. Eat on time.  Testing your blood glucose regularly. Check your blood glucose before and after exercise. If you exercise longer or different than usual, be sure to check blood glucose more frequently.  Wearing your medical alert jewelry that says you have diabetes.  Identify the cause of your hypoglycemia. Then, develop ways to prevent the recurrence of hypoglycemia.  Do not take a hot bath or shower right after an insulin shot.  Always carry treatment with you. Glucose tablets are the easiest to carry.  If you are going to drink alcohol, drink it only with meals.  Tell friends or family members ways to keep you safe during a seizure. This may include removing hard or sharp objects from the area or  turning you on your side.  Maintain a healthy weight. SEEK MEDICAL CARE IF:   You are having problems keeping your blood glucose in your target range.  You are having frequent episodes of hypoglycemia.  You feel you might be having side effects from your medicines.  You are not sure why your blood glucose is dropping so low.  You notice a change in vision or a new problem with your vision. SEEK IMMEDIATE MEDICAL CARE IF:   Confusion develops.  A change in mental status occurs.  The inability to swallow develops.  Fainting occurs. Document Released: 01/05/2005 Document Revised: 01/10/2013 Document Reviewed: 05/04/2011 [facility] Patient Information 2015 [place], [place]. This information is not intended to replace advice given to you by your health care provider. Make sure you discuss any questions you have with your health care provider.

## 2013-12-06 NOTE — Progress Notes (Signed)
Subjective: Patient had an episode of possible hypoglycemia at work today. Her sugar was 80 but she arty eaten some stuff. She got feeling sweaty and weak and faint. She has not had documented hypoglycemic spells but has thought she might have them. She is not diabetic. She is on a couple of medications. See list. She has also had some urinary frequency.  Objective: No acute distress. Throat clear. Neck supple without nodes. Chest clear. Heart regular without murmurs. She patient is alert and oriented. No carotid bruits.  Results for orders placed or performed in visit on 12/06/13  POCT urinalysis dipstick  Result Value Ref Range   Color, UA yellow    Clarity, UA clear    Glucose, UA neg    Bilirubin, UA neg    Ketones, UA neg    Spec Grav, UA 1.015    Blood, UA neg    pH, UA 7.0    Protein, UA neg    Urobilinogen, UA 0.2    Nitrite, UA neg    Leukocytes, UA moderate (2+)   POCT UA - Microscopic Only  Result Value Ref Range   WBC, Ur, HPF, POC 0-5    RBC, urine, microscopic 0-2    Bacteria, U Microscopic trace    Mucus, UA neg    Epithelial cells, urine per micros 5-7    Crystals, Ur, HPF, POC neg    Casts, Ur, LPF, POC neg    Yeast, UA neg   POCT glucose (manual entry)  Result Value Ref Range   POC Glucose 83 70 - 99 mg/dl   : Assessment: Probable UTI Possible hypoglycemia Weakness spell  Plan: Urine culture Treat for UTI Increase herprotein in morning diet Return if further symptoms.

## 2013-12-08 LAB — URINE CULTURE

## 2014-04-02 ENCOUNTER — Other Ambulatory Visit: Payer: Self-pay

## 2014-04-02 DIAGNOSIS — Z1231 Encounter for screening mammogram for malignant neoplasm of breast: Secondary | ICD-10-CM

## 2014-04-05 ENCOUNTER — Encounter: Payer: Self-pay | Admitting: Cardiovascular Disease

## 2014-04-05 ENCOUNTER — Ambulatory Visit (INDEPENDENT_AMBULATORY_CARE_PROVIDER_SITE_OTHER): Payer: BLUE CROSS/BLUE SHIELD | Admitting: Cardiovascular Disease

## 2014-04-05 VITALS — BP 142/82 | HR 94 | Ht 68.75 in | Wt 283.8 lb

## 2014-04-05 DIAGNOSIS — Q211 Atrial septal defect: Secondary | ICD-10-CM

## 2014-04-05 DIAGNOSIS — Q2111 Secundum atrial septal defect: Secondary | ICD-10-CM

## 2014-04-05 NOTE — Progress Notes (Signed)
Cardiology Office Note   Date:  04/05/2014   ID:  Diana [name], DOB 04-Nov-1955, MRN [id number]  PCP:  Elby Showers, MD  Cardiologist:  Sherren Mocha, MD    No chief complaint on file.    History of Present Illness: Diana [name] is a 59 y.o. female who presents for follow-up evaluation.  The patient was diagnosed with a small ASD about 15 years ago. Most of her evaluation was done at [facility]. She ultimately underwent a transesophageal echocardiogram and was told that her ASD was small and could be managed medically. She had a cardiac MRI done in 2009 and demonstrated no evidence of right-sided chamber enlargement, nor was there any evidence of significant shunting. The ASD was not visualized. As it had been several years since her last cardiac imaging study, a 2-D echocardiogram was done last year. This demonstrated no evidence of interatrial shunting and there was no evidence of right-sided cardiac chamber enlargement.  She is here alone today. The patient is doing okay. She's had a stressful year at work. She's gained almost 30 pounds since last year's visit and she is quite frustrated with this. She is determined to lose weight. She has no new symptoms. She specifically denies chest pain, chest pressure, shortness of breath, orthopnea, PND, or dizziness.   Past Medical History  Diagnosis Date  . Atrial septal defect     Small,non significant atrial septal defect with QP/QS of 1.25 by MRA.  . Obesity   . Statin intolerance     with elevated CRP  . Pseudotumor cerebri Diagnosed in March 2008-    She sees Dr Clovis Riley in this regard  . Sleep apnea   . Obesity   . Migraine headache   . Anxiety     controlled  . Migraine     occasionally  . Obstructive apnea     CPAP user since 2012.   . OSA on CPAP 09/01/2013  . Cancer     Past Surgical History  Procedure Laterality Date  . Orthopedic surgery      lumbar surgery   . Right knee arthroscopy      . Bunionectomy    . Pin in the fifth metatarsal      Current Outpatient Prescriptions  Medication Sig Dispense Refill  . aspirin EC 81 MG tablet Take 1 tablet (81 mg total) by mouth daily.    . cetirizine (ZYRTEC) 10 MG tablet Take 10 mg by mouth daily.      . Cholecalciferol (VITAMIN D) 2000 UNITS CAPS Take 1 capsule by mouth 2-3 times per week    . desvenlafaxine (PRISTIQ) 50 MG 24 hr tablet Take 50 mg by mouth daily.      . SUMAtriptan (IMITREX) 100 MG tablet Take 1 tablet (100 mg total) by mouth once. May repeat in 2 hours if headache persists or recurs. 9 tablet 6   No current facility-administered medications for this visit.    Allergies:   Amoxicillin   Social History:  The patient  reports that she has never smoked. She has never used smokeless tobacco. She reports that she drinks alcohol. She reports that she does not use illicit drugs.   Family History:  The patient's  family history includes Alzheimer's disease in her mother; Rheumatic fever in her father.   PHYSICAL EXAM: VS:  BP 142/82 mmHg  Pulse 94  Ht 5' 8.75" (1.746 m)  Wt 283 lb 12.8 oz (128.731 kg)  BMI 42.23  kg/m2 , BMI Body mass index is 42.23 kg/(m^2). GEN: Well nourished, well developed, pleasant obese woman in no acute distress HEENT: normal Neck: no JVD, no masses. No carotid bruits Cardiac: RRR without murmur or gallop                Respiratory:  clear to auscultation bilaterally, normal work of breathing GI: soft, nontender MS: no deformity or atrophy Ext: no pretibial edema, pedal pulses 2+= bilaterally Skin: warm and dry, no rash Neuro:  Strength and sensation are intact Psych: euthymic mood, full affect  EKG:  EKG is ordered today. The ekg ordered today shows normal sinus rhythm 94 bpm, within normal limits.  Recent Labs: No results found for requested labs within last 365 days.   Lipid Panel     Component Value Date/Time   CHOL 175 02/14/2013 0909   TRIG 146 02/14/2013 0909   HDL 36*  02/14/2013 0909   CHOLHDL 4.9 02/14/2013 0909   VLDL 29 02/14/2013 0909   LDLCALC 110* 02/14/2013 0909      Wt Readings from Last 3 Encounters:  04/05/14 283 lb 12.8 oz (128.731 kg)  12/06/13 279 lb (126.554 kg)  09/01/13 270 lb (122.471 kg)     Cardiac Studies Reviewed: 2-D echocardiogram: Study Conclusions  - Left ventricle: The cavity size was normal. There was moderate focal basal and mild concentric hypertrophy of the septum. Systolic function was normal. Wall motion was normal; there were no regional wall motion abnormalities. Doppler parameters are consistent with abnormal left ventricular relaxation (grade 1 diastolic dysfunction). There was no evidence of elevated ventricular filling pressure by Doppler parameters. - Aortic valve: No regurgitation. - Mitral valve: Trivial regurgitation. - Left atrium: The atrium was normal in size. - Atrial septum: No defect or patent foramen ovale was identified. - Tricuspid valve: No regurgitation. - Pulmonary arteries: Systolic pressure was within the normal range. Impressions:  - No evidence of ASD.  ASSESSMENT AND PLAN: 1.  Ostium secundum ASD: Last year's echo reviewed with no evidence of right heart enlargement and no interatrial shunt. Suspect she has a very small defect. She has no symptoms related to this. She continues on aspirin 81 mg daily.  2. High blood pressure. Discussed relationship to her weight. I reviewed her pressures over the last 2 years and the majority are in range, but she does have a few elevated blood pressure readings. Work on diet and exercise. I did not recommend pharmacotherapy today.  3. Obesity. Majority of our time was spent in counseling. Advised her to set realistic goals. We discussed Weight Watchers. She has determined to lose weight and will work on this on her own.  4. Family history of AAA. Her father had an abdominal aortic aneurysm in his 109s. He was a nonsmoker. I  recommended a screening duplex ultrasound.  Current medicines are reviewed with the patient today.  The patient does not have concerns regarding medicines.  The following changes have been made:  no change  Labs/ tests ordered today include:  No orders of the defined types were placed in this encounter.   Disposition:   FU one year  Signed, Sherren Mocha, MD  04/05/2014 3:45 PM    [facility] [address], [place], [place]  [address] Phone: [phone]; Fax: [phone]

## 2014-04-05 NOTE — Patient Instructions (Signed)
Your physician wants you to follow-up in: 1 YEAR with Dr Burt Knack.  You will receive a reminder letter in the mail two months in advance. If you don't receive a letter, please call our office to schedule the follow-up appointment.  Your physician recommends that you continue on your current medications as directed. Please refer to the Current Medication list given to you today.  Our office offers Vascular screening: $125 out of pocket for carotid, abdominal aorta and ABI or $50 for single test  Abdominal Aortic Aneurysm An aneurysm is a weakened or damaged part of an artery wall that bulges from the normal force of blood pumping through the body. An abdominal aortic aneurysm is an aneurysm that occurs in the lower part of the aorta, the main artery of the body.  The major concern with an abdominal aortic aneurysm is that it can enlarge and burst (rupture) or blood can flow between the layers of the wall of the aorta through a tear (aorticdissection). Both of these conditions can cause bleeding inside the body and can be life threatening unless diagnosed and treated promptly. CAUSES  The exact cause of an abdominal aortic aneurysm is unknown. Some contributing factors are:   A hardening of the arteries caused by the buildup of fat and other substances in the lining of a blood vessel (arteriosclerosis).  Inflammation of the walls of an artery (arteritis).   Connective tissue diseases, such as Marfan syndrome.   Abdominal trauma.   An infection, such as syphilis or staphylococcus, in the wall of the aorta (infectious aortitis) caused by bacteria. RISK FACTORS  Risk factors that contribute to an abdominal aortic aneurysm may include:  Age older than 36 years.   High blood pressure (hypertension).  Female gender.  Ethnicity (white race).  Obesity.  Family history of aneurysm (first degree relatives only).  Tobacco use. PREVENTION  The following healthy lifestyle habits may help  decrease your risk of abdominal aortic aneurysm:  Quitting smoking. Smoking can raise your blood pressure and cause arteriosclerosis.  Limiting or avoiding alcohol.  Keeping your blood pressure, blood sugar level, and cholesterol levels within normal limits.  Decreasing your salt intake. In somepeople, too much salt can raise blood pressure and increase your risk of abdominal aortic aneurysm.  Eating a diet low in saturated fats and cholesterol.  Increasing your fiber intake by including whole grains, vegetables, and fruits in your diet. Eating these foods may help lower blood pressure.  Maintaining a healthy weight.  Staying physically active and exercising regularly. SYMPTOMS  The symptoms of abdominal aortic aneurysm may vary depending on the size and rate of growth of the aneurysm.Most grow slowly and do not have any symptoms. When symptoms do occur, they may include:  Pain (abdomen, side, lower back, or groin). The pain may vary in intensity. A sudden onset of severe pain may indicate that the aneurysm has ruptured.  Feeling full after eating only small amounts of food.  Nausea or vomiting or both.  Feeling a pulsating lump in the abdomen.  Feeling faint or passing out. DIAGNOSIS  Since most unruptured abdominal aortic aneurysms have no symptoms, they are often discovered during diagnostic exams for other conditions. An aneurysm may be found during the following procedures:  Ultrasonography (A one-time screening for abdominal aortic aneurysm by ultrasonography is also recommended for all men aged 47-75 years who have ever smoked).  X-ray exams.  A computed tomography (CT).  Magnetic resonance imaging (MRI).  Angiography or arteriography. TREATMENT  Treatment of an abdominal aortic aneurysm depends on the size of your aneurysm, your age, and risk factors for rupture. Medication to control blood pressure and pain may be used to manage aneurysms smaller than 6 cm.  Regular monitoring for enlargement may be recommended by your caregiver if:  The aneurysm is 3-4 cm in size (an annual ultrasonography may be recommended).  The aneurysm is 4-4.5 cm in size (an ultrasonography every 6 months may be recommended).  The aneurysm is larger than 4.5 cm in size (your caregiver may ask that you be examined by a vascular surgeon). If your aneurysm is larger than 6 cm, surgical repair may be recommended. There are two main methods for repair of an aneurysm:   Endovascular repair (a minimally invasive surgery). This is done most often.  Open repair. This method is used if an endovascular repair is not possible. Document Released: 10/15/2004 Document Revised: 05/02/2012 Document Reviewed: 02/05/2012 [facility] Patient Information 2015 [place], [place]. This information is not intended to replace advice given to you by your health care provider. Make sure you discuss any questions you have with your health care provider.

## 2014-05-06 ENCOUNTER — Other Ambulatory Visit: Payer: Self-pay | Admitting: Internal Medicine

## 2014-05-21 ENCOUNTER — Ambulatory Visit
Admission: RE | Admit: 2014-05-21 | Discharge: 2014-05-21 | Disposition: A | Discharge status: Home / Self Care | Payer: BLUE CROSS/BLUE SHIELD | Source: Ambulatory Visit

## 2014-05-21 DIAGNOSIS — Z1231 Encounter for screening mammogram for malignant neoplasm of breast: Secondary | ICD-10-CM

## 2014-06-08 ENCOUNTER — Encounter: Payer: Self-pay | Admitting: Internal Medicine

## 2014-08-30 ENCOUNTER — Encounter: Payer: Self-pay | Admitting: Neurology

## 2014-08-30 ENCOUNTER — Ambulatory Visit (INDEPENDENT_AMBULATORY_CARE_PROVIDER_SITE_OTHER): Payer: BLUE CROSS/BLUE SHIELD | Admitting: Neurology

## 2014-08-30 VITALS — BP 126/84 | HR 86 | Resp 20 | Ht 68.0 in | Wt 284.0 lb

## 2014-08-30 DIAGNOSIS — G4733 Obstructive sleep apnea (adult) (pediatric): Secondary | ICD-10-CM

## 2014-08-30 DIAGNOSIS — Z9989 Dependence on other enabling machines and devices: Principal | ICD-10-CM

## 2014-08-30 NOTE — Progress Notes (Signed)
Chief Complaint  Patient presents with  . Follow-up    rm 11, alone, cpap f/u   HPI:  Interval history :    08-30-14 Established sleep patient , formerly of Dr Erling Cruz,  seen today for a one-year checkup on CPAP compliance. The patient has a history of migraines controlled on Imitrex, just taking vitamin D, Zyrtec for rhinitis.  She had been diagnosed with sleep apnea and has been using CPAP she brought the memory chip of her CPAP machine today to the office, but unfortunately the  software system is struggling to load the data. [name] is a 59  y.o. female here as a referral from Dr. Caprice Beaver. She used to follow Dr. Erling Cruz, who signed her CPAP orders, but she was never scheduled for a formal sleep evaluation.  Mrs. Shawn underwent a split study on 9-28 2012. She has seen me yearly since then and has an AutoSet CPAP machine. Her sleep habits have not changed. She still not taking naps, she is not a shift worker, she usually goes to bed between 10 and 11 PM and rises at 6.00 in the morning, Most of the nights she will need an alarm to wake up in the morning. She will be received fresh and restored after a nocturnal sleep of about 7-7-1/2 hours.    07-29-12 for a followup visit for auto set CPAP  Data.The patient used the old port titrator between 613 and 710. Average daily usage was 7 hours and 42 minutes, which is a very good result. Residual apnea was 0.9 or. She had very minor air leaks and her 95th percentile pressure on CPAP was 10 cm the window or for this Ultracet was between 7 and 15 cm water she endorsed the Epworth sleepiness score it only took points today. I would like for her to have the machine set at 10 cm water with a peak TR function. The patient has always been compliant with the CPAP but this is in [place] port at the CPAP actually has reduced her apnea to a single-digit orbital single-digit level. She's 100% compliant. She had been placed on an auto-titrator before the  SPLIT study and slept well with it. She tried changing to a different sleep setting of 9 cm water, and resumed afterwards her old autoset CPAP machine.  The patient never had a tonsillectomy. During her youth on [place] used a Health and safety inspector for retrognathia.  She has no TMJ. She has a deformed anterior chest wall, Her estimated nocturnal sleep time about 7 hours.  She  does not complain of nocturia, as no breakthrough snoring or witnessed apneas.  The patient's on the cardiology history is a patent foramen ovale, she has or a body weight of 239 pounds at 5 feet 8 inches. Since the last sleep study she first gained over 60 pounds and lost already 25.  College years the patient has never worked shift work again., She does not take daytime naps. The patient is using her 59 year old autotitrator machine.  She received a new one after her sleep study end of 2012 but was unable to tolerate it.  Fam. history: Her father has a history of excessive daytime sleepiness loud snoring and she witnessed several times apneas , also he was never formally diagnosed. He lived to be 95.     ROS : Her husband reported no breakthrough snoring but has told her that she sometimes seems to be the regular but the patient herself has  not awoken from this or it had not the feeling of choking. The patient likes her current mask and has no pressure marks or blisters requiring a change. There needs to be nor change and setting except primarily setting the machine to 10 cm water with a 3 cm EPR. The patient endorsed the Epworth Sleepiness Scale at 3 points the fatigue severity at 19 points, both are well below average.    General: The patient is awake, alert and appears not in acute distress. The patient is well groomed.  Neck circumference, 16.25  [place]pati, 3, no Retrognathia.  Head: Normocephalic, atraumatic. Neck is supple. Cardiovascular:  Regular rate and rhythm , without  murmurs or carotid bruit, and without  distended neck veins.  Respiratory: Lungs are clear to auscultation. Skin:  Without evidence of edema, or rash Trunk: BMI is elevated .   Neurologic exam : The patient is awake and alert, oriented to place and time.   Memory subjective described as intact. She reports working " in [employer]".  There is a normal attention span & concentration ability.  Speech is fluent without dysarthria, dysphonia or aphasia. Mood and affect are appropriate.  Cranial nerves: Pupils are equal and briskly reactive to light. Extraocular movements  in vertical and horizontal planes intact and without nystagmus. Visual fields by finger perimetry are intact. Hearing to finger rub intact.  Facial sensation intact to fine touch. Facial motor strength is symmetric and tongue and uvula move midline. Motor exam:  Normal tone and  muscle bulk and symmetric normal strength in all extremities. Sensory:  Fine touch, pinprick and vibration were tested in all extremities.  Coordination: Rapid alternating movements in the fingers/hands is tested and normal.  Gait and station: Patient walks without assistive device.  The patient did very well with CPAP and is highly compliant , CPAP at auto-titration between 8 and 18 cm water.  The patient's download was obtained today during her office visit and the patient has a residual AHI of 1.3 which is an excellent resolution. Average daily use a time is 7 hours 9 minutes the machine is an outdoor set between 4 and 20 cm water with a medium pressure of 9.6 cm water. The airleak is mild-to-moderate the need to be no adjustments made to the machine the apneas optimally treated.  The patient should return to auto-titration on her newer CPAP? She brought a more than 61 years old. She has a new machine 18 month Nasal mask.

## 2014-08-30 NOTE — Patient Instructions (Signed)

## 2014-11-10 ENCOUNTER — Other Ambulatory Visit: Payer: Self-pay | Admitting: Internal Medicine

## 2014-11-12 NOTE — Telephone Encounter (Signed)
Refill once. When was she last seen and does she need CPE?

## 2014-11-14 NOTE — Telephone Encounter (Signed)
Refill #9 with 0 rf. Note added: patient must schedule OV appt for further refills.

## 2014-12-25 ENCOUNTER — Encounter: Payer: Self-pay | Admitting: Internal Medicine

## 2014-12-25 ENCOUNTER — Ambulatory Visit (INDEPENDENT_AMBULATORY_CARE_PROVIDER_SITE_OTHER): Payer: PRIVATE HEALTH INSURANCE | Admitting: Internal Medicine

## 2014-12-25 VITALS — BP 146/92 | HR 96 | Temp 98.0°F | Resp 20 | Ht 68.0 in | Wt 281.0 lb

## 2014-12-25 DIAGNOSIS — N3000 Acute cystitis without hematuria: Secondary | ICD-10-CM

## 2014-12-25 DIAGNOSIS — R300 Dysuria: Secondary | ICD-10-CM

## 2014-12-25 LAB — POCT URINALYSIS DIPSTICK
BILIRUBIN UA: NEGATIVE
Glucose, UA: NEGATIVE
Ketones, UA: NEGATIVE
NITRITE UA: NEGATIVE
PH UA: 6
PROTEIN UA: NEGATIVE
Spec Grav, UA: 1.025
UROBILINOGEN UA: 0.2

## 2014-12-25 MED ORDER — NITROFURANTOIN MONOHYD MACRO 100 MG PO CAPS: 100.0000 mg | ORAL_CAPSULE | Freq: Two times a day (BID) | ORAL | Status: DC

## 2014-12-25 NOTE — Progress Notes (Signed)
   Subjective:    Patient ID: Diana [name], female    DOB: 05-09-1955, 59 y.o.   MRN: [id number]  HPI patient in today complaining of urinary urgency and incontinence. Thinks she has a UTI. No fever or shaking chills. No back pain. No nausea or vomiting. Says she had similar symptoms seen at urgent care November 2015 and was diagnosed with UTI. Culture grew multiple species. Have her she improved with antibiotics.    Review of Systems as above     Objective:   Physical Exam  Urinalysis is abnormal. 2+ and the noted on dipstick. Culture sent. No CVA tenderness.      Assessment & Plan:  Urinary incontinence-possible UTI  Plan: Macrobid 100 mg twice daily for 10 days. Urine culture pending.  Addendum: Urine culture grew diphtheroids

## 2014-12-25 NOTE — Patient Instructions (Addendum)
Macrobid 100 mg twice a day for 10 days. Culture is pending.

## 2014-12-27 ENCOUNTER — Ambulatory Visit: Payer: Self-pay | Admitting: Internal Medicine

## 2014-12-27 LAB — CULTURE, URINE COMPREHENSIVE: Colony Count: 50000

## 2014-12-28 ENCOUNTER — Ambulatory Visit: Payer: Self-pay | Admitting: Internal Medicine

## 2015-02-11 ENCOUNTER — Other Ambulatory Visit: Payer: PRIVATE HEALTH INSURANCE | Admitting: Internal Medicine

## 2015-02-11 DIAGNOSIS — Z0000 Encounter for general adult medical examination without abnormal findings: Secondary | ICD-10-CM

## 2015-02-11 DIAGNOSIS — Z13220 Encounter for screening for lipoid disorders: Secondary | ICD-10-CM

## 2015-02-11 DIAGNOSIS — Z1329 Encounter for screening for other suspected endocrine disorder: Secondary | ICD-10-CM

## 2015-02-11 DIAGNOSIS — Z130 Encounter for screening for diseases of the blood and blood-forming organs and certain disorders involving the immune mechanism: Secondary | ICD-10-CM

## 2015-02-11 DIAGNOSIS — E559 Vitamin D deficiency, unspecified: Secondary | ICD-10-CM

## 2015-02-11 DIAGNOSIS — Z79899 Other long term (current) drug therapy: Secondary | ICD-10-CM

## 2015-02-11 LAB — CBC WITH DIFFERENTIAL/PLATELET
Basophils Absolute: 0.1 10*3/uL (ref 0.0–0.1)
Basophils Relative: 1 % (ref 0–1)
EOS ABS: 0.3 10*3/uL (ref 0.0–0.7)
EOS PCT: 5 % (ref 0–5)
HEMATOCRIT: 41 % (ref 36.0–46.0)
Hemoglobin: 13.5 g/dL (ref 12.0–15.0)
LYMPHS ABS: 2.3 10*3/uL (ref 0.7–4.0)
Lymphocytes Relative: 37 % (ref 12–46)
MCH: 29 pg (ref 26.0–34.0)
MCHC: 32.9 g/dL (ref 30.0–36.0)
MCV: 88.2 fL (ref 78.0–100.0)
MONO ABS: 0.4 10*3/uL (ref 0.1–1.0)
MONOS PCT: 6 % (ref 3–12)
MPV: 10 fL (ref 8.6–12.4)
Neutro Abs: 3.2 10*3/uL (ref 1.7–7.7)
Neutrophils Relative %: 51 % (ref 43–77)
PLATELETS: 235 10*3/uL (ref 150–400)
RBC: 4.65 MIL/uL (ref 3.87–5.11)
RDW: 13.5 % (ref 11.5–15.5)
WBC: 6.2 10*3/uL (ref 4.0–10.5)

## 2015-02-11 LAB — LIPID PANEL
CHOL/HDL RATIO: 7 ratio — AB (ref <=5.0)
CHOLESTEROL: 245 mg/dL — AB (ref 125–200)
HDL: 35 mg/dL — AB (ref >=46)
LDL Cholesterol: 144 mg/dL — ABNORMAL HIGH (ref <130)
Triglycerides: 329 mg/dL — ABNORMAL HIGH (ref <150)
VLDL: 66 mg/dL — ABNORMAL HIGH (ref <30)

## 2015-02-11 LAB — COMPLETE METABOLIC PANEL WITH GFR
ALT: 28 U/L (ref 6–29)
AST: 21 U/L (ref 10–35)
Albumin: 4.3 g/dL (ref 3.6–5.1)
Alkaline Phosphatase: 70 U/L (ref 33–130)
BUN: 18 mg/dL (ref 7–25)
CALCIUM: 9.2 mg/dL (ref 8.6–10.4)
CHLORIDE: 98 mmol/L (ref 98–110)
CO2: 27 mmol/L (ref 20–31)
Creat: 0.89 mg/dL (ref 0.50–0.99)
GFR, EST AFRICAN AMERICAN: 81 mL/min (ref >=60)
GFR, EST NON AFRICAN AMERICAN: 70 mL/min (ref >=60)
Glucose, Bld: 96 mg/dL (ref 65–99)
Potassium: 4.2 mmol/L (ref 3.5–5.3)
Sodium: 137 mmol/L (ref 135–146)
Total Bilirubin: 0.5 mg/dL (ref 0.2–1.2)
Total Protein: 6.8 g/dL (ref 6.1–8.1)

## 2015-02-11 LAB — HEMOGLOBIN A1C
HEMOGLOBIN A1C: 5.6 % (ref <5.7)
MEAN PLASMA GLUCOSE: 114 mg/dL (ref <117)

## 2015-02-11 LAB — TSH: TSH: 4.617 u[IU]/mL — AB (ref 0.350–4.500)

## 2015-02-11 NOTE — Progress Notes (Signed)
Patient requested a1c for wellness program acceptance through her employer.

## 2015-02-12 LAB — VITAMIN D 25 HYDROXY (VIT D DEFICIENCY, FRACTURES): VIT D 25 HYDROXY: 20 ng/mL — AB (ref 30–100)

## 2015-02-14 ENCOUNTER — Encounter: Payer: Self-pay | Admitting: Internal Medicine

## 2015-02-14 ENCOUNTER — Ambulatory Visit (INDEPENDENT_AMBULATORY_CARE_PROVIDER_SITE_OTHER): Payer: PRIVATE HEALTH INSURANCE | Admitting: Internal Medicine

## 2015-02-14 VITALS — BP 130/84 | HR 93 | Temp 98.4°F | Resp 20 | Ht 68.0 in | Wt 278.0 lb

## 2015-02-14 DIAGNOSIS — Z8669 Personal history of other diseases of the nervous system and sense organs: Secondary | ICD-10-CM | POA: Diagnosis not present

## 2015-02-14 DIAGNOSIS — E039 Hypothyroidism, unspecified: Secondary | ICD-10-CM | POA: Diagnosis not present

## 2015-02-14 DIAGNOSIS — E785 Hyperlipidemia, unspecified: Secondary | ICD-10-CM

## 2015-02-14 DIAGNOSIS — Z0000 Encounter for general adult medical examination without abnormal findings: Secondary | ICD-10-CM

## 2015-02-14 DIAGNOSIS — Z8659 Personal history of other mental and behavioral disorders: Secondary | ICD-10-CM

## 2015-02-14 DIAGNOSIS — Z85820 Personal history of malignant melanoma of skin: Secondary | ICD-10-CM | POA: Diagnosis not present

## 2015-02-14 DIAGNOSIS — J069 Acute upper respiratory infection, unspecified: Secondary | ICD-10-CM | POA: Diagnosis not present

## 2015-02-14 DIAGNOSIS — E559 Vitamin D deficiency, unspecified: Secondary | ICD-10-CM | POA: Diagnosis not present

## 2015-02-14 MED ORDER — LEVOFLOXACIN 500 MG PO TABS: 500.0000 mg | ORAL_TABLET | Freq: Every day | ORAL | Status: DC

## 2015-02-15 ENCOUNTER — Encounter: Payer: Self-pay | Admitting: Internal Medicine

## 2015-02-15 ENCOUNTER — Telehealth: Payer: Self-pay | Admitting: Internal Medicine

## 2015-02-15 DIAGNOSIS — E039 Hypothyroidism, unspecified: Secondary | ICD-10-CM | POA: Insufficient documentation

## 2015-02-15 DIAGNOSIS — E785 Hyperlipidemia, unspecified: Secondary | ICD-10-CM | POA: Insufficient documentation

## 2015-02-15 MED ORDER — ERGOCALCIFEROL 1.25 MG (50000 UT) PO CAPS: ORAL_CAPSULE | ORAL | Status: DC

## 2015-02-15 MED ORDER — LEVOTHYROXINE SODIUM 50 MCG PO TABS: 50.0000 ug | ORAL_TABLET | Freq: Every day | ORAL | Status: DC

## 2015-02-15 NOTE — Patient Instructions (Signed)
Take vitamin D 50,000 units weekly for 12 weeks and purchased 2000 units vitamin D 3 over-the-counter and take that daily. Start levothyroxine 0.05 mg daily and follow-up in 3 months. Watch diet. For cough, take Levaquin 500 milligrams daily for 10 days.

## 2015-02-15 NOTE — Telephone Encounter (Signed)
Noted  

## 2015-02-15 NOTE — Telephone Encounter (Signed)
Sorry will order these.

## 2015-02-15 NOTE — Telephone Encounter (Signed)
Patient said when she went to pick up her prescriptions at the pharmacy they only had the antibiotic.  They did not have the Generic Synthroid and the Vitimin D that was discussed on her office visit yesterday.

## 2015-03-19 NOTE — Progress Notes (Signed)
Subjective:    Patient ID: Diana [name], female    DOB: Feb 22, 1955, 60 y.o.   MRN: [id number]  HPI 60 year old Female in today for health maintenance exam and evaluation of medical issues. New issue today is cough and congestion. Has malaise and fatigue. She has a history of migraine headaches for which she takes generic Imitrex. Had colonoscopy 2011 by [name] with 10 year follow-up recommended.  At one point in 2015 she was taking Armour Thyroid per nurse practitioner, [name]. She got more energy on that.  Patient had Pneumovax immunization 2002.  No known drug allergies  History of vitamin D deficiency.  History of sleep disorder. Had sleep study by [name] in 2006. He felt she did not qualify for sleep apnea. History of loud snoring. She had a normal sleep study. She had mild periodic limb movement with arousal. He felt she had some degree of sleep-disordered breathing/upper airway resistance.  Patient was hospitalized in 2007 with volume depletion, viral syndrome and selective serotonin reuptake inhibitor discontinuation syndrome. She had upper endoscopy by [name] at the time which was normal.  Patient saw [name] prior to his retirement in 2009. She was diagnosed with an ASD in 2001 at [name] in [name]. Was advised to have this followed medically. At the time she saw [name] she had no cardiac symptoms but occasional shortness of breath which was subtle. No chest pain. Apparently she does have a small ASD felt to be a secundum ASD with left-to-right shunting by color flow Doppler. 2-D echocardiogram showed normal LV's function in 2009. No segmental wall motion abnormalities. Left atrium was normal. Right atrium was normal. No aortic stenosis. No mitral valve prolapse. Normal tricuspid valve. Normal pulmonic valve. AST was very small. She had normal LV systolic function with impaired LV relaxation. Normal right-sided chamber  size and function. SBE prophylaxis was recommended in 2001.  History of possible pseudotumor cerebri in 2008. She saw [name] and was thought to have papilledema versus pseudopapilledema without significant elevated opening pressures a lumbar puncture. Lumbar puncture was done in 2008.  Long-standing history of obesity.  Social history: Consumes maybe 1 alcoholic drink per month and no more than one a week. No children. Has never been pregnant. She tried infertility treatments without success. Does not smoke. Married. 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 Previously took Luvox. Has been treated in the past for Topamax for migraine headaches.  Family history: Mother with history of migraine headache. Father died at age 57 with history of stroke and congestive heart failure. One brother in good health. Mother died at age 50 with a colonic infarction but had history of Alzheimer's disease.  History of basal cell carcinoma of her back in 2012.  In 2012 she was diagnosed with malignant melanoma left thigh. This was treated at [name] and had lymph node sampling. She is followed by [name]. Lesion was Clark level IV.    Review of Systems  Constitutional: Positive for fatigue.  Respiratory:       Cough congestion malaise with upper respiratory infection  Cardiovascular:       History of ASD  Neurological:       History of migraine headaches  Psychiatric/Behavioral:       Long-standing history of  depression       Objective:   Physical Exam  Constitutional: She is oriented to person, place, and time. She appears well-developed and well-nourished.  HENT:  Head: Normocephalic and atraumatic.  Right Ear: External ear normal.  Left Ear: External ear normal.  Mouth/Throat: Oropharynx is clear and  moist. No oropharyngeal exudate.  Eyes: Conjunctivae are normal. Pupils are equal, round, and reactive to light. Right eye exhibits no discharge. Left eye exhibits no discharge.  Neck: Neck supple. No JVD present. No thyromegaly present.  Cardiovascular: Normal rate, regular rhythm, normal heart sounds and intact distal pulses.   No murmur heard. Pulmonary/Chest: Effort normal and breath sounds normal. She has no wheezes.  Abdominal: Soft. Bowel sounds are normal. She exhibits no distension. There is no tenderness. There is no rebound and no guarding.  Musculoskeletal: She exhibits no edema.  Lymphadenopathy:    She has no cervical adenopathy.  Neurological: She is alert and oriented to person, place, and time. She has normal reflexes. No cranial nerve deficit.  Skin: Skin is warm and dry. No rash noted.  Psychiatric: She has a normal mood and affect. Her behavior is normal. Judgment and thought content normal.  Vitals reviewed.         Assessment & Plan:  Acute URI-treat with Levaquin 500 milligrams daily for 10 days  Elevated TSH -start levothyroxine 0.05 mg daily and return in 3 months  History of migraine headaches  History depression  Vitamin D deficiency-take 50,000 units Drisdol weekly for 12 weeks and purchased 2000 units vitamin D 3 over-the-counter and take daily  History of malignant melanoma Clark level IV followed by [name]  History of ASD-seen by cardiologist  Hyperlipidemia-has significant elevation of triglycerides and total cholesterol as well as LDL cholesterol. Needs to diet and exercise and follow up in 3-6 months.  Obesity see above  Plan: Follow-up appointment in 3 months

## 2015-04-09 ENCOUNTER — Ambulatory Visit: Payer: BLUE CROSS/BLUE SHIELD | Admitting: Cardiovascular Disease

## 2015-04-15 ENCOUNTER — Encounter: Payer: Self-pay | Admitting: Cardiovascular Disease

## 2015-04-15 ENCOUNTER — Ambulatory Visit (INDEPENDENT_AMBULATORY_CARE_PROVIDER_SITE_OTHER): Payer: PRIVATE HEALTH INSURANCE | Admitting: Cardiovascular Disease

## 2015-04-15 VITALS — BP 130/84 | HR 86 | Ht 68.75 in | Wt 286.4 lb

## 2015-04-15 DIAGNOSIS — Q211 Atrial septal defect, unspecified: Secondary | ICD-10-CM

## 2015-04-15 NOTE — Patient Instructions (Signed)

## 2015-04-15 NOTE — Progress Notes (Signed)
Cardiology Office Note Date:  04/15/2015   ID:  Diana [name], DOB April 07, 1955, MRN [id number]  PCP:  Elby Showers, MD  Cardiologist:  Sherren Mocha, MD    Chief Complaint  Patient presents with  . Hyperlipidemia    has had some chest pain and LEFT leg edema. denies and sob or claudication     History of Present Illness: Diana [name] is a 60 y.o. female who presents for follow-up evaluation.  The patient was diagnosed with a small ASD about 15 years ago. Most of her evaluation was [facility]. She ultimately underwent a transesophageal echocardiogram and was told that her ASD was small and could be managed medically. She had a cardiac MRI done in 2009 and demonstrated no evidence of right-sided chamber enlargement, nor was there any evidence of significant shunting. The ASD was not visualized. As it had been several years since her last cardiac imaging study, a 2-D echocardiogram was done last year. This demonstrated no evidence of interatrial shunting and there was no evidence of right-sided cardiac chamber enlargement.  The patient is doing okay. She's had a lot of stress at work. Her practice as, under the management of [facility] with lots of changes. She denies symptoms of heart palpitations, shortness of breath, or chest pain. She's had no lightheadedness or syncope. She has started Weight Watchers and has lost 10 pounds.  Past Medical History  Diagnosis Date  . Atrial septal defect     Small,non significant atrial septal defect with QP/QS of 1.25 by MRA.  . Obesity   . Statin intolerance     with elevated CRP  . Pseudotumor cerebri Diagnosed in March 2008-    She sees Dr Clovis Riley in this regard  . Sleep apnea   . Obesity   . Migraine headache   . Anxiety     controlled  . Migraine     occasionally  . Obstructive apnea     CPAP user since 2012.   . OSA on CPAP 09/01/2013  . Cancer [facility])     Past Surgical History  Procedure  Laterality Date  . Orthopedic surgery      lumbar surgery   . Right knee arthroscopy    . Bunionectomy    . Pin in the fifth metatarsal      Current Outpatient Prescriptions  Medication Sig Dispense Refill  . aspirin EC 81 MG tablet Take 1 tablet (81 mg total) by mouth daily.    . cetirizine (ZYRTEC) 10 MG tablet Take 10 mg by mouth daily.      Marland Kitchen desvenlafaxine (PRISTIQ) 50 MG 24 hr tablet Take 50 mg by mouth daily.      . ergocalciferol (VITAMIN D2) 50000 units capsule One capsule weekly x 12 weeks then obtain OTC Vitamin D 12 capsule 0  . levothyroxine (SYNTHROID, LEVOTHROID) 50 MCG tablet Take 1 tablet (50 mcg total) by mouth daily. 90 tablet 1  . SUMAtriptan (IMITREX) 100 MG tablet TAKE 1 TABLET (100 MG TOTAL) BY MOUTH ONCE. MAY REPEAT IN 2 HOURS IF HEADACHE PERSISTS OR RECURS. 9 tablet 0   No current facility-administered medications for this visit.    Allergies:   Amoxicillin   Social History:  The patient  reports that she has never smoked. She has never used smokeless tobacco. She reports that she drinks alcohol. She reports that she does not use illicit drugs.   Family History:  The patient's family history includes Alzheimer's  disease in her mother; Rheumatic fever in her father.    ROS:  Please see the history of present illness.  All other systems are reviewed and negative.    PHYSICAL EXAM: VS:  BP 130/84 mmHg  Pulse 86  Ht 5' 8.75" (1.746 m)  Wt 286 lb 6.4 oz (129.91 kg)  BMI 42.61 kg/m2 , BMI Body mass index is 42.61 kg/(m^2). GEN: Well nourished, well developed, pleasant obese woman in no acute distress HEENT: normal Neck: no JVD, no masses. No carotid bruits Cardiac: RRR without murmur or gallop                Respiratory:  clear to auscultation bilaterally, normal work of breathing GI: soft, nontender, nondistended, + BS MS: no deformity or atrophy Ext: no pretibial edema, pedal pulses 2+= bilaterally Skin: warm and dry, no rash Neuro:  Strength and  sensation are intact Psych: euthymic mood, full affect  EKG:  EKG is ordered today. The ekg ordered today shows normal sinus rhythm 85 bpm, possible left atrial enlargement, otherwise within normal limits.  Recent Labs: 02/11/2015: ALT 28; BUN 18; Creat 0.89; Hemoglobin 13.5; Platelets 235; Potassium 4.2; Sodium 137; TSH 4.617*   Lipid Panel     Component Value Date/Time   CHOL 245* 02/11/2015 0902   TRIG 329* 02/11/2015 0902   HDL 35* 02/11/2015 0902   CHOLHDL 7.0* 02/11/2015 0902   VLDL 66* 02/11/2015 0902   LDLCALC 144* 02/11/2015 0902      Wt Readings from Last 3 Encounters:  04/15/15 286 lb 6.4 oz (129.91 kg)  02/14/15 278 lb (126.1 kg)  12/25/14 281 lb (127.461 kg)     Cardiac Studies Reviewed: 2D Echo 04/14/2013: Study Conclusions  - Left ventricle: The cavity size was normal. There was moderate focal basal and mild concentric hypertrophy of the septum. Systolic function was normal. Wall motion was normal; there were no regional wall motion abnormalities. Doppler parameters are consistent with abnormal left ventricular relaxation (grade 1 diastolic dysfunction). There was no evidence of elevated ventricular filling pressure by Doppler parameters. - Aortic valve: No regurgitation. - Mitral valve: Trivial regurgitation. - Left atrium: The atrium was normal in size. - Atrial septum: No defect or patent foramen ovale was identified. - Tricuspid valve: No regurgitation. - Pulmonary arteries: Systolic pressure was within the normal range. Impressions:  - No evidence of ASD.  ASSESSMENT AND PLAN: 1.  Secundum ASD, small: The patient is asymptomatic. There has been no evidence of right-sided heart enlargement with serial imaging studies. Her last echocardiogram from 2015 is reviewed. This will be repeated next year prior to her office visit. Unlikely to become an issue considering her current age and the fact that she's never had hemodynamically  significant shunting. She will remain on aspirin 81 mg daily.  2. Hyperlipidemia: Recent lipids reviewed and are much higher than past values. We reviewed her weights over the last 2 years and she has gained 40 pounds. Her thyroid studies demonstrated mild hypothyroidism. She has been started on Synthroid by Dr. Renold Genta. Follow-up lipids are planned. Patient educated about lifestyle modification.  Current medicines are reviewed with the patient today.  The patient does not have concerns regarding medicines.  Labs/ tests ordered today include:  No orders of the defined types were placed in this encounter.    Disposition:   FU one year with an echo prior to the visit  Signed, Sherren Mocha, MD  04/15/2015 10:17 AM    [facility]  [address] [place], [place]  [address] Phone: [phone]; Fax: [phone]

## 2015-05-28 ENCOUNTER — Other Ambulatory Visit: Payer: PRIVATE HEALTH INSURANCE | Admitting: Internal Medicine

## 2015-05-28 DIAGNOSIS — E785 Hyperlipidemia, unspecified: Secondary | ICD-10-CM

## 2015-05-28 DIAGNOSIS — E039 Hypothyroidism, unspecified: Secondary | ICD-10-CM

## 2015-05-28 LAB — LIPID PANEL
Cholesterol: 236 mg/dL — ABNORMAL HIGH (ref 125–200)
HDL: 41 mg/dL — ABNORMAL LOW (ref >=46)
LDL CALC: 148 mg/dL — AB (ref <130)
Total CHOL/HDL Ratio: 5.8 Ratio — ABNORMAL HIGH (ref <=5.0)
Triglycerides: 233 mg/dL — ABNORMAL HIGH (ref <150)
VLDL: 47 mg/dL — AB (ref <30)

## 2015-05-28 LAB — TSH: TSH: 2.91 m[IU]/L

## 2015-05-31 ENCOUNTER — Encounter: Payer: Self-pay | Admitting: Internal Medicine

## 2015-05-31 ENCOUNTER — Ambulatory Visit (INDEPENDENT_AMBULATORY_CARE_PROVIDER_SITE_OTHER): Payer: PRIVATE HEALTH INSURANCE | Admitting: Internal Medicine

## 2015-05-31 VITALS — BP 166/88 | HR 104 | Temp 98.3°F | Resp 18 | Wt 278.0 lb

## 2015-05-31 DIAGNOSIS — Z8659 Personal history of other mental and behavioral disorders: Secondary | ICD-10-CM | POA: Diagnosis not present

## 2015-05-31 DIAGNOSIS — Z8639 Personal history of other endocrine, nutritional and metabolic disease: Secondary | ICD-10-CM

## 2015-05-31 DIAGNOSIS — Z8669 Personal history of other diseases of the nervous system and sense organs: Secondary | ICD-10-CM | POA: Diagnosis not present

## 2015-05-31 DIAGNOSIS — E039 Hypothyroidism, unspecified: Secondary | ICD-10-CM

## 2015-05-31 DIAGNOSIS — E785 Hyperlipidemia, unspecified: Secondary | ICD-10-CM | POA: Diagnosis not present

## 2015-05-31 DIAGNOSIS — E669 Obesity, unspecified: Secondary | ICD-10-CM

## 2015-05-31 MED ORDER — LEVOTHYROXINE SODIUM 50 MCG PO TABS: 50.0000 ug | ORAL_TABLET | Freq: Every day | ORAL | Status: DC

## 2015-06-17 NOTE — Patient Instructions (Signed)
Continue diet exercise and weight also efforts. Return in January 2018 for physical exam. No change in medications

## 2015-06-17 NOTE — Progress Notes (Signed)
   Subjective:    Patient ID: Diana [name], female    DOB: February 08, 1955, 60 y.o.   MRN: [id number]  HPI She returns today discuss hyperlipidemia and hypothyroidism. She was seen for physical exam in January. She has a history of migraine headaches treated with Imitrex. Used to take Topamax in the remote past prescribed by Dr. love. In 2015 she began to take armor thyroid per Carroll Kinds, nurse practitioner. She thought the armor thyroid preparation gave her more energy than Synthroid. In January TSH was 4.617 and is now 2.91. She currently is on Synthroid 0.05 mg daily. She also had significant hyperlipidemia in January with total cholesterol being 245 and is now 236. Triglycerides were 329 and have improved to 233. LDL cholesterol was 144 and is now 148. She does not want to take statin medication. She is on Pristiq for long-standing history of depression. Used to take Luvox. Her blood pressure is elevated today. She's under stress at work.    Review of Systems     Objective:   Physical Exam  Not examined. Spent 25 minutes speaking with her about these issues. She is certain she doesn't want to take statin medication. 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      Assessment & Plan:  Hyperlipidemia-does not want to be on statin medication. Recommend diet and exercise and recheck in January 2018.  Hypothyroidism-controlled on low-dose Synthroid  History of depression  Obesity-encouraged diet exercise and weight loss  Plan: Follow-up in January 2018. Will be due for physical exam at that time.

## 2015-07-01 ENCOUNTER — Ambulatory Visit: Payer: BLUE CROSS/BLUE SHIELD | Admitting: Cardiovascular Disease

## 2015-07-16 ENCOUNTER — Other Ambulatory Visit: Payer: Self-pay | Admitting: Internal Medicine

## 2015-07-16 DIAGNOSIS — Z1231 Encounter for screening mammogram for malignant neoplasm of breast: Secondary | ICD-10-CM

## 2015-07-29 ENCOUNTER — Ambulatory Visit
Admission: RE | Admit: 2015-07-29 | Discharge: 2015-07-29 | Disposition: A | Discharge status: Home / Self Care | Payer: PRIVATE HEALTH INSURANCE | Source: Ambulatory Visit | Attending: Internal Medicine | Admitting: Internal Medicine

## 2015-07-29 DIAGNOSIS — Z1231 Encounter for screening mammogram for malignant neoplasm of breast: Secondary | ICD-10-CM

## 2015-08-29 ENCOUNTER — Ambulatory Visit: Payer: PRIVATE HEALTH INSURANCE | Admitting: Neurology

## 2015-09-11 ENCOUNTER — Ambulatory Visit (INDEPENDENT_AMBULATORY_CARE_PROVIDER_SITE_OTHER): Payer: PRIVATE HEALTH INSURANCE | Admitting: Neurology

## 2015-09-11 ENCOUNTER — Encounter: Payer: Self-pay | Admitting: Neurology

## 2015-09-11 VITALS — BP 162/90 | HR 100 | Resp 20 | Ht 68.5 in | Wt 296.0 lb

## 2015-09-11 DIAGNOSIS — Z9989 Dependence on other enabling machines and devices: Principal | ICD-10-CM

## 2015-09-11 DIAGNOSIS — G4733 Obstructive sleep apnea (adult) (pediatric): Secondary | ICD-10-CM | POA: Diagnosis not present

## 2015-09-11 NOTE — Patient Instructions (Addendum)
Dear Diana [name],    Keep the same settings as the old machine provided.  Auto pressure window. I script for a air sene machine by [employer].  Please Fit for [place]son nasal mask/ let her try a dream wear model, too   Asencion Partridge D.

## 2015-09-11 NOTE — Addendum Note (Signed)
Addended by: Lester Hiawatha A on: 09/11/2015 05:18 PM   Modules accepted: Orders

## 2015-09-11 NOTE — Progress Notes (Signed)
Chief Complaint  Patient presents with  . Follow-up    wants new cpap   HPI:  Interval history : 08-30-14 Established sleep patient , formerly of Diana [name],  seen today for a one-year checkup on CPAP compliance. The patient has a history of migraines controlled on Imitrex, just taking vitamin D, Zyrtec for rhinitis.  She had been diagnosed with sleep apnea and has been using CPAP she brought the memory chip of her CPAP machine today to the office, but unfortunately the  software system is struggling to load the data.  Diana [name] is a 60  y.o. female here as a referral from Diana [name]. She used to follow Diana. Erling [name], who signed her CPAP orders, but she was never scheduled for a formal sleep evaluation.  Diana [name] underwent a split study on 9-28 2012. She has seen me yearly since then and has an AutoSet CPAP machine. Her sleep habits have not changed. She still not taking naps, she is not a shift worker, she usually goes to bed between 10 and 11 PM and rises at 6.00 in the morning, Most of the nights she will need an alarm to wake up in the morning. She will be received fresh and restored after a nocturnal sleep of about 7-7-1/2 hours.    07-29-12 for a followup visit for auto set CPAP  Data.The patient used the auto titrator between . Average daily usage was 7 hours and 42 minutes, which is a very good result. Residual apnea was 0.9 or. She had very minor air leaks and her 95th percentile pressure on CPAP was 10 cm the window or for this Ultracet was between 7 and 15 cm water she endorsed the Epworth sleepiness score it only took points today. I would like for her to have the machine set at 10 cm water with a peak TR function. The patient has always been compliant with the CPAP but this is in [place] at the CPAP actually has reduced her apnea to a single-digit orbital single-digit level. She's 100% compliant. She had been placed on an auto-titrator before the SPLIT study and slept well  with it. She tried changing to a different sleep setting of 9 cm water, and resumed afterwards her old autoset CPAP machine.  The patient never had a tonsillectomy. During her youth on [place] used a Health and safety inspector for retrognathia.  She has no TMJ. She has a deformed anterior chest wall, Her estimated nocturnal sleep time about 7 hours.  She  does not complain of nocturia, as no breakthrough snoring or witnessed apneas.  The patient's on the cardiology history is a patent foramen ovale, she has or a body weight of 239 pounds at 5 feet 8 inches. Since the last sleep study she first gained over 60 pounds and lost already 25.  College years the patient has never worked shift work again., She does not take daytime naps. The patient is using her 60 year old autotitrator machine.  She received a new one after her sleep study end of 2012 but was unable to tolerate it.  Fam. history: Her father has a history of excessive daytime sleepiness loud snoring and she witnessed several times apneas , also he was never formally diagnosed. He lived to be 95.   09-11-2015 Diana [name] presents today broken hearted as her CPAP machine has a tight. He has used a AutoSet advantage the S8 machine for over 9 years. She has been a very compliant CPAP  user with 100% compliance up to early August 2017 average user time of 7 hours 11 minutes, AutoSet is allowing pressures between 10 and 15 cm water was 1 cm EPR and the residual AHI was 1.2. Air leaks were very mild 91st percentile pressure was 12 cm water. I will sign a prescription and [facility] machine I would like her to have an air sense machine with an O2 sat between 8 and 15 cm water and 1 cm EPR. I will ask Diana [name] to set the machine for her. She endorsed today the Epworth sleepiness score at 2 points, she does not endorse a high fatigue, she is not clinically depressed. She just would like to start CPAP.    ROS : Her husband  reported no breakthrough snoring but has told her that she sometimes seems to be the regular but the patient herself has not awoken from this or it had not the feeling of choking.The patient likes her current mask and has no pressure marks or blisters requiring a change. There needs to be nor change and setting except primarily setting the machine to 10 cm water with a 3 cm EPR. The patient endorsed the Epworth Sleepiness Scale at 3 points the fatigue severity at 19 points, both are well below average.   General: The patient is awake, alert and appears not in acute distress. The patient is well groomed.  Neck circumference, 16.25  Mallompati, 3, no Retrognathia.  Head: Normocephalic, atraumatic. Neck is supple. Cardiovascular:  Regular rate and rhythm , without  murmurs or carotid bruit, and without distended neck veins. Respiratory: Lungs are clear to auscultation.Skin:  Without evidence of edema, or rashTrunk: BMI is elevated .  Neurologic exam : The patient is awake and alert, oriented to place and time.   Memory subjective described as intact. She reports working " in [employer]".  There is a normal attention span & concentration ability.  Speech is fluent without dysarthria, dysphonia or aphasia. Mood and affect are appropriate.  Cranial nerves: Pupils are equal and briskly reactive to light. Extraocular movements  in vertical and horizontal planes intact and without nystagmus. Visual fields by finger perimetry are intact. Hearing to finger rub intact.  Facial sensation intact to fine touch. Facial motor strength is symmetric and tongue and uvula move midline. The patient should return  After her  auto-titration  newer CPAP has been issued.   She brought a more than 60 years old.

## 2015-12-10 ENCOUNTER — Other Ambulatory Visit: Payer: Self-pay | Admitting: Internal Medicine

## 2015-12-11 NOTE — Telephone Encounter (Signed)
Thyroid medication refilled. Appointment made.

## 2015-12-11 NOTE — Telephone Encounter (Signed)
CPE due January. Please call and book appt and refill once

## 2015-12-24 ENCOUNTER — Ambulatory Visit: Payer: PRIVATE HEALTH INSURANCE | Admitting: Neurology

## 2016-02-06 ENCOUNTER — Ambulatory Visit: Payer: PRIVATE HEALTH INSURANCE | Admitting: Neurology

## 2016-02-07 ENCOUNTER — Telehealth: Payer: Self-pay

## 2016-02-07 NOTE — Telephone Encounter (Signed)
I called pt. I advised her that our office will be closed on 02/06/16 and that we will call her to get her appt rescheduled. Pt verbalized understanding.

## 2016-02-07 NOTE — Telephone Encounter (Signed)
I called pt. She is agreeable to a March 8th, 2018 11:00am cpap appt with Dr. Brett Fairy. Pt verbalized understanding of new appt date and time.

## 2016-02-13 ENCOUNTER — Other Ambulatory Visit: Payer: PRIVATE HEALTH INSURANCE | Admitting: Internal Medicine

## 2016-02-13 DIAGNOSIS — E039 Hypothyroidism, unspecified: Secondary | ICD-10-CM

## 2016-02-13 DIAGNOSIS — E785 Hyperlipidemia, unspecified: Secondary | ICD-10-CM

## 2016-02-13 DIAGNOSIS — E559 Vitamin D deficiency, unspecified: Secondary | ICD-10-CM

## 2016-02-13 DIAGNOSIS — Z0000 Encounter for general adult medical examination without abnormal findings: Secondary | ICD-10-CM

## 2016-02-13 LAB — CBC WITH DIFFERENTIAL/PLATELET
BASOS PCT: 0 %
Basophils Absolute: 0 cells/uL (ref 0–200)
EOS PCT: 7 %
Eosinophils Absolute: 357 cells/uL (ref 15–500)
HCT: 40.2 % (ref 35.0–45.0)
Hemoglobin: 13.3 g/dL (ref 11.7–15.5)
LYMPHS PCT: 39 %
Lymphs Abs: 1989 cells/uL (ref 850–3900)
MCH: 29.2 pg (ref 27.0–33.0)
MCHC: 33.1 g/dL (ref 32.0–36.0)
MCV: 88.2 fL (ref 80.0–100.0)
MPV: 10.1 fL (ref 7.5–12.5)
Monocytes Absolute: 306 cells/uL (ref 200–950)
Monocytes Relative: 6 %
NEUTROS PCT: 48 %
Neutro Abs: 2448 cells/uL (ref 1500–7800)
PLATELETS: 217 10*3/uL (ref 140–400)
RBC: 4.56 MIL/uL (ref 3.80–5.10)
RDW: 13.3 % (ref 11.0–15.0)
WBC: 5.1 10*3/uL (ref 3.8–10.8)

## 2016-02-13 LAB — COMPREHENSIVE METABOLIC PANEL
ALBUMIN: 4.2 g/dL (ref 3.6–5.1)
ALK PHOS: 73 U/L (ref 33–130)
ALT: 17 U/L (ref 6–29)
AST: 21 U/L (ref 10–35)
BUN: 16 mg/dL (ref 7–25)
CO2: 22 mmol/L (ref 20–31)
CREATININE: 0.76 mg/dL (ref 0.50–0.99)
Calcium: 9.3 mg/dL (ref 8.6–10.4)
Chloride: 105 mmol/L (ref 98–110)
Glucose, Bld: 98 mg/dL (ref 65–99)
POTASSIUM: 4.4 mmol/L (ref 3.5–5.3)
SODIUM: 142 mmol/L (ref 135–146)
TOTAL PROTEIN: 7.1 g/dL (ref 6.1–8.1)
Total Bilirubin: 0.7 mg/dL (ref 0.2–1.2)

## 2016-02-13 LAB — LIPID PANEL
CHOLESTEROL: 239 mg/dL — AB (ref <200)
HDL: 40 mg/dL — ABNORMAL LOW (ref >50)
LDL Cholesterol: 164 mg/dL — ABNORMAL HIGH (ref <100)
Total CHOL/HDL Ratio: 6 Ratio — ABNORMAL HIGH (ref <5.0)
Triglycerides: 174 mg/dL — ABNORMAL HIGH (ref <150)
VLDL: 35 mg/dL — ABNORMAL HIGH (ref <30)

## 2016-02-13 LAB — TSH: TSH: 2.01 mIU/L

## 2016-02-13 NOTE — Addendum Note (Signed)
Addended by: Inocencio Homes on: 02/13/2016 10:24 AM   Modules accepted: Orders

## 2016-02-13 NOTE — Addendum Note (Signed)
Addended by: Inocencio Homes on: 02/13/2016 11:17 AM   Modules accepted: Orders

## 2016-02-13 NOTE — Addendum Note (Signed)
Addended by: Inocencio Homes on: 02/13/2016 11:15 AM   Modules accepted: Orders

## 2016-02-14 LAB — HEMOGLOBIN A1C
HEMOGLOBIN A1C: 5.2 % (ref <5.7)
MEAN PLASMA GLUCOSE: 103 mg/dL

## 2016-02-14 LAB — VITAMIN D 25 HYDROXY (VIT D DEFICIENCY, FRACTURES): Vit D, 25-Hydroxy: 21 ng/mL — ABNORMAL LOW (ref 30–100)

## 2016-02-20 ENCOUNTER — Ambulatory Visit (INDEPENDENT_AMBULATORY_CARE_PROVIDER_SITE_OTHER): Payer: PRIVATE HEALTH INSURANCE | Admitting: Internal Medicine

## 2016-02-20 ENCOUNTER — Encounter: Payer: Self-pay | Admitting: Internal Medicine

## 2016-02-20 VITALS — BP 130/82 | HR 84 | Temp 98.4°F | Ht 69.0 in | Wt 283.0 lb

## 2016-02-20 DIAGNOSIS — J069 Acute upper respiratory infection, unspecified: Secondary | ICD-10-CM | POA: Diagnosis not present

## 2016-02-20 DIAGNOSIS — Z8669 Personal history of other diseases of the nervous system and sense organs: Secondary | ICD-10-CM | POA: Diagnosis not present

## 2016-02-20 DIAGNOSIS — Q211 Atrial septal defect, unspecified: Secondary | ICD-10-CM

## 2016-02-20 DIAGNOSIS — Z8659 Personal history of other mental and behavioral disorders: Secondary | ICD-10-CM

## 2016-02-20 DIAGNOSIS — E782 Mixed hyperlipidemia: Secondary | ICD-10-CM

## 2016-02-20 DIAGNOSIS — Z85820 Personal history of malignant melanoma of skin: Secondary | ICD-10-CM

## 2016-02-20 DIAGNOSIS — Z0000 Encounter for general adult medical examination without abnormal findings: Secondary | ICD-10-CM | POA: Diagnosis not present

## 2016-02-20 DIAGNOSIS — E559 Vitamin D deficiency, unspecified: Secondary | ICD-10-CM | POA: Diagnosis not present

## 2016-02-20 DIAGNOSIS — Z6841 Body Mass Index (BMI) 40.0 and over, adult: Secondary | ICD-10-CM | POA: Diagnosis not present

## 2016-02-20 DIAGNOSIS — E039 Hypothyroidism, unspecified: Secondary | ICD-10-CM

## 2016-02-20 LAB — POC URINALSYSI DIPSTICK (AUTOMATED)
BILIRUBIN UA: NEGATIVE
Blood, UA: NEGATIVE
GLUCOSE UA: NEGATIVE
Ketones, UA: NEGATIVE
LEUKOCYTES UA: NEGATIVE
NITRITE UA: NEGATIVE
Protein, UA: NEGATIVE
Spec Grav, UA: <=1.005
Urobilinogen, UA: NEGATIVE
pH, UA: 6.5

## 2016-02-20 MED ORDER — SUMATRIPTAN SUCCINATE 100 MG PO TABS: ORAL_TABLET | ORAL | 2 refills | Status: DC

## 2016-02-20 MED ORDER — LEVOTHYROXINE SODIUM 50 MCG PO TABS: 50.0000 ug | ORAL_TABLET | Freq: Every day | ORAL | 3 refills | Status: DC

## 2016-02-20 MED ORDER — CLARITHROMYCIN 500 MG PO TABS: 500.0000 mg | ORAL_TABLET | Freq: Two times a day (BID) | ORAL | 0 refills | Status: DC

## 2016-02-20 NOTE — Progress Notes (Signed)
Subjective:    Patient ID: Diana [name], female    DOB: [birth year], 61 y.o.   MRN: [id number]  HPI 61 year old female in today for health maintenance exam and evaluation of medical issues.   She thinks she will have a hemi arthroplasty right knee by Dr. Mardelle Matte near the end of the summer.  She saw Dr. Caprice Beaver recently and will be seeing nurse practitioner in the future for care is Dr. Caprice Beaver is transitioning to another position. She remains on Pristiq.  History of migraine headaches seems to be related to weather changes. Imitrex refilled.  Has recently had some nasal congestion. No fever or chills. Had flu vaccine through employment.  History of vitamin D deficiency  No known drug allergies  Had Pneumovax immunization 2002.  History of sleep disorder. Had sleep study by Dr. Annamaria Boots in 2006 and he fell she did not qualify for sleep apnea. History of loud snoring. She had a normal sleep study. She had mild periodic limb movement with arousal. He felt she had some degree of sleep-disordered breathing and upper airway resistance.  Patient was hospitalized in 2007 with volume depletion, viral syndrome, and selective serotonin reuptake inhibitor discontinuation syndrome. She had upper endoscopy by Dr. Deatra Ina that time which was normal.  She saw Dr. Doreatha Lew prior to his retirement in 2009 and was diagnosed with an AST in 2001 at [facility] pediatric cardiology clinic in [place]. Was advised to have this followed medically. At times she saw Dr. Doreatha Lew, she had no cardiac symptoms but occasional shortness of breath which was subtle. No chest pain. Apparently she does have a small ASD felt to be a secundum ASD with left-to-right shunting by color flow Doppler. 2-D echocardiogram showed normal LV function in 2009. No segmental wall motion abnormality. Left atrium was normal. Right atrium was normal. No aortic stenosis. No mitral valve prolapse. Normal tricuspid valve. Normal pulmonic valve.  ASD was very small. She had a normal LV systolic function with impaired LV relaxation. Normal right chamber size and function. SBE prophylaxis was recommended in 2001.  She is now followed by Dr. Burt Knack, Cardiologist. Cardiac MRI in 2009 demonstrated no evidence of right-sided chamber enlargement nor was there any evidence of significant shunting or right-sided chamber enlargement. 2-D echocardiogram was done in 2016. This demonstrated no evidence of interatrial shunting. There was no evidence of right-sided cardiac chamber enlargement.  History of possible pseudotumor cerebri in 2008. She saw Dr. Erling Cruz and was thought to have papilledema versus pseudopapilledema without significant elevated opening pressures on lumbar puncture. Lumbar puncture was done in 2008 .  Long-standing history of obesity.  Long-standing history of depression and previously took Luvox.  Has been treated with Topamax in the past for migraine headaches.  History of basal cell carcinoma upper back in 2012.  In 2012 she was diagnosed with malignant melanoma left thigh treated at [facility] and had lymph node sampling. She is followed by Dr. Ubaldo Glassing. Lesion was Clark level IV.  Social history: Consumes maybe 1 alcoholic drink per month. No more than one a week. No children. Has never been pregnant. She tried fertility treatments without success. Does not smoke. Married. 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  Family history: Mother with history of migraine headaches. Father died at age 55 with history of stroke and congestive heart failure. One brother in good health. Mother died at age 33 with a:  Infarction but had history of also worse disease.     Review of Systems  Constitutional: Positive for fatigue.  Eyes: Negative.   Cardiovascular: Negative for chest pain and palpitations.  Genitourinary: Negative.     Musculoskeletal: Positive for arthralgias.  Neurological: Negative.    see above      Objective:   Physical Exam  Constitutional: She is oriented to person, place, and time. She appears well-developed and well-nourished.  HENT:  Sounds nasally congested  Eyes: Conjunctivae and EOM are normal. Pupils are equal, round, and reactive to light.  Neck: Neck supple. No JVD present. No thyromegaly present.  Cardiovascular: Normal rate, regular rhythm and normal heart sounds.   Pulmonary/Chest: She has no wheezes.  Abdominal: Soft. Bowel sounds are normal. She exhibits no distension and no mass. There is no tenderness. There is no rebound and no guarding.  Genitourinary:  Genitourinary Comments: Deferred  Musculoskeletal: She exhibits no edema.  Neurological: She is alert and oriented to person, place, and time. She has normal reflexes. No cranial nerve deficit. Coordination normal.  Skin: Skin is warm and dry. No rash noted.  Psychiatric: She has a normal mood and affect. Her behavior is normal. Thought content normal.  Vitals reviewed.         Assessment & Plan:   Hyperlipidemia-does not want to be on statin medication. She is trying to diet. She wants to return in about 3 months and follow-up with this issue before trying medication.  History of depression-stable on current treatment  History of vitamin D deficiency  History of migraine headaches  History of ASD followed by Dr. Burt Knack  Obesity  Acute URI-treat with Biaxin  Hypothyroidism-started on low-dose Synthroid January 2017 and TSH is normal  Plan: She was made a follow-up appointment in May 2018 Re: Hyperlipidemia and weight loss.

## 2016-03-15 NOTE — Patient Instructions (Addendum)
Continue same medications. Encouraged diet exercise and weight loss. Return in May 2018 for follow-up. Take Biaxin as directed for URI.

## 2016-03-26 ENCOUNTER — Ambulatory Visit: Payer: Self-pay | Admitting: Neurology

## 2016-03-30 ENCOUNTER — Encounter: Payer: Self-pay | Admitting: Neurology

## 2016-04-02 ENCOUNTER — Ambulatory Visit (INDEPENDENT_AMBULATORY_CARE_PROVIDER_SITE_OTHER): Payer: PRIVATE HEALTH INSURANCE | Admitting: Neurology

## 2016-04-02 ENCOUNTER — Encounter: Payer: Self-pay | Admitting: Neurology

## 2016-04-02 VITALS — BP 174/96 | HR 87 | Resp 20 | Wt 302.5 lb

## 2016-04-02 DIAGNOSIS — Z9989 Dependence on other enabling machines and devices: Secondary | ICD-10-CM | POA: Diagnosis not present

## 2016-04-02 DIAGNOSIS — G4733 Obstructive sleep apnea (adult) (pediatric): Secondary | ICD-10-CM | POA: Diagnosis not present

## 2016-04-02 DIAGNOSIS — E669 Obesity, unspecified: Secondary | ICD-10-CM

## 2016-04-02 DIAGNOSIS — I10 Essential (primary) hypertension: Secondary | ICD-10-CM | POA: Diagnosis not present

## 2016-04-02 NOTE — Progress Notes (Signed)
Chief Complaint  Patient presents with  . OSA on CPAP    rm 10  . Follow-up    6 month   HPI:  Diana [name] is a 61  y.o. female here as a referral from Dr. Caprice Beaver. She used to follow Dr. Erling Cruz, who signed her CPAP orders, but she was never scheduled for a formal sleep evaluation.  Diana [name] underwent a split study on 9-28 2012. She has seen me yearly since then and has an AutoSet CPAP machine. Her sleep habits have not changed. She still not taking naps, she is not a shift worker, she usually goes to bed between 10 and 11 PM and rises at 6.00 in the morning, Most of the nights she will need an alarm to wake up in the morning. She will be received fresh and restored after a nocturnal sleep of about 7-7-1/2 hours.    07-29-12 for a followup visit : The patient used the auto titrator between . Average daily usage was 7 hours and 42 minutes, which is a very good result. Residual apnea was 0.9 or. She had very minor air leaks and her 95th percentile pressure on CPAP was 10 cm the window or for this Ultracet was between 7 and 15 cm water she endorsed the Epworth sleepiness score it only took points today. I would like for her to have the machine set at 10 cm water with a peak TR function. The patient has always been compliant with the CPAP but this is in [place] at the CPAP actually has reduced her apnea to a single-digit orbital single-digit level. She's 100% compliant. She had been placed on an auto-titrator before the SPLIT study and slept well with it. She tried changing to a different sleep setting of 9 cm water, and resumed afterwards her old autoset CPAP machine.  The patient never had a tonsillectomy. During her youth on [place] used a Health and safety inspector for retrognathia. She has no TMJ. She has a deformed anterior chest wall, Her estimated nocturnal sleep time about 7 hours.  She  does not complain of nocturia, as no breakthrough snoring or witnessed apneas.  The patient's on  the cardiology history is a patent foramen ovale, she has or a body weight of 239 pounds at 5 feet 8 inches. Since the last sleep study she first gained over 60 pounds and lost already 25. College years the patient has never worked shift work again., She does not take daytime naps. The patient is using her 61 year old autotitrator machine. She received a new one after her sleep study end of 2012 but was unable to tolerate it.  Fam. history: Her father has a history of excessive daytime sleepiness loud snoring and she witnessed several times apneas , also he was never formally diagnosed. He lived to be 95.   09-11-2015 Diana [name] presents today brokenhearted as her CPAP machine has a tight. He has used a AutoSet advantage the S8 machine for over 9 years. She has been a very compliant CPAP user with 100% compliance up to early August 2017 average user time of 7 hours 11 minutes, AutoSet is allowing pressures between 10 and 15 cm water was 1 cm EPR and the residual AHI was 1.2. Air leaks were very mild 91st percentile pressure was 12 cm water. I will sign a prescription and [facility]AP machine I would like her to have an air sense machine with an  O2 sat between 8 and 15 cm water and 1 cm EPR. I will ask Graciella Belton to set the machine for her. She endorsed today the Epworth sleepiness score at 2 points, she does not endorse a high fatigue, she is not clinically depressed. She just would like to start CPAP.   Interval history on 04/02/2016, Diana [name] is here for her regular CPAP compliance visits and she has done excellent. 100% compliant 7 hours and 3 minutes average user time for the last 30 days, she's using an AutoSet between 8 and 15 cm water, and the 95th percentile pressure is 13.1 cm at night. The residual apneas is 0.6 per hour. Her husband believes that he heard some breakthrough snoring at night.   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     ROS : The patient endorsed the Epworth sleepiness score at 2 points, the fatigue severity scale at 17 points, and the geriatric depression score at 1 out of 15 points.  General: The patient is awake, alert and appears not in acute distress. The patient is well groomed. Neck circumference, 16.25  Mallompati, 3, no Retrognathia. Head: Normocephalic, atraumatic. Neck is supple. Cardiovascular:  Regular rate and rhythm , without  murmurs or carotid bruit, and without distended neck veins. Respiratory: Lungs are clear to auscultation.Skin:  Without evidence of edema, or rashTrunk: BMI is elevated .  Neurologic exam :The patient is awake and alert, oriented to place and time.   Cranial nerves:  No loss of taste or smell sensation. Pupils are equal and briskly reactive to light. Extraocular movements  in vertical and horizontal planes intact . Visual fields by finger perimetry are intact. Hearing to finger rub intact.  Facial sensation intact to fine touch. Facial motor strength is symmetric and tongue and uvula move midline. Motor: patient has  Knee arthritis , but good REM .   Assessment; Diana [name] is doing excellently with CPAP use, she achieves at least 7 hours of nocturnal sleep, she usually sleeps uninterrupted, she feels refreshed and restored in the morning endorsed neither depression symptoms, no fatigue and a resetting of her CPAP is not necessary. The patient can remain on AutoPap. I would like to see her yearly with her machine which can be WiFi loaded.   Legacie Dillingham, MD

## 2016-05-27 ENCOUNTER — Encounter: Payer: Self-pay | Admitting: Cardiovascular Disease

## 2016-05-28 ENCOUNTER — Other Ambulatory Visit: Payer: PRIVATE HEALTH INSURANCE | Admitting: Internal Medicine

## 2016-06-02 ENCOUNTER — Other Ambulatory Visit: Payer: Self-pay | Admitting: Cardiovascular Disease

## 2016-06-02 DIAGNOSIS — Z8249 Family history of ischemic heart disease and other diseases of the circulatory system: Secondary | ICD-10-CM

## 2016-06-04 ENCOUNTER — Ambulatory Visit (HOSPITAL_BASED_OUTPATIENT_CLINIC_OR_DEPARTMENT_OTHER): Payer: PRIVATE HEALTH INSURANCE

## 2016-06-04 ENCOUNTER — Other Ambulatory Visit: Payer: Self-pay

## 2016-06-04 ENCOUNTER — Ambulatory Visit (HOSPITAL_COMMUNITY)
Admission: RE | Admit: 2016-06-04 | Discharge: 2016-06-04 | Disposition: A | Discharge status: Home / Self Care | Payer: PRIVATE HEALTH INSURANCE | Source: Ambulatory Visit | Attending: Cardiology | Admitting: Cardiology

## 2016-06-04 DIAGNOSIS — Z8249 Family history of ischemic heart disease and other diseases of the circulatory system: Secondary | ICD-10-CM

## 2016-06-04 DIAGNOSIS — Q211 Atrial septal defect, unspecified: Secondary | ICD-10-CM

## 2016-06-11 ENCOUNTER — Other Ambulatory Visit: Payer: PRIVATE HEALTH INSURANCE | Admitting: Internal Medicine

## 2016-06-18 ENCOUNTER — Ambulatory Visit (INDEPENDENT_AMBULATORY_CARE_PROVIDER_SITE_OTHER): Payer: PRIVATE HEALTH INSURANCE | Admitting: Cardiovascular Disease

## 2016-06-18 ENCOUNTER — Encounter: Payer: Self-pay | Admitting: Cardiovascular Disease

## 2016-06-18 ENCOUNTER — Other Ambulatory Visit: Payer: PRIVATE HEALTH INSURANCE | Admitting: Internal Medicine

## 2016-06-18 ENCOUNTER — Telehealth: Payer: Self-pay | Admitting: *Deleted

## 2016-06-18 VITALS — BP 136/84 | HR 83 | Ht 68.5 in | Wt 299.0 lb

## 2016-06-18 DIAGNOSIS — E785 Hyperlipidemia, unspecified: Secondary | ICD-10-CM | POA: Diagnosis not present

## 2016-06-18 DIAGNOSIS — Q211 Atrial septal defect, unspecified: Secondary | ICD-10-CM

## 2016-06-18 MED ORDER — PRAVASTATIN SODIUM 40 MG PO TABS: 40.0000 mg | ORAL_TABLET | Freq: Every evening | ORAL | 3 refills | Status: DC

## 2016-06-18 MED ORDER — CO-ENZYME Q10 200 MG PO CAPS: 200.0000 mg | ORAL_CAPSULE | Freq: Every day | ORAL | Status: DC

## 2016-06-18 NOTE — Progress Notes (Signed)
Cardiology Office Note Date:  06/18/2016   ID:  Diana [name], DOB 22-Nov-1955, MRN [id number]  PCP:  Elby Showers, MD  Cardiologist:  Sherren Mocha, MD    Chief Complaint  Patient presents with  . Follow-up    ASD     History of Present Illness: Diana [name] is a 61 y.o. female who presents for  follow-up evaluation.  The patient was diagnosed with a small ASD about 15 years ago. Most of her evaluation was done at [facility]. She ultimately underwent a transesophageal echocardiogram and was told that her ASD was small and could be managed medically. She had a cardiac MRI done in 2009 and demonstrated no evidence of right-sided chamber enlargement, nor was there any evidence of significant shunting. She recently had a surveillance echo to evaluate for any right-sided cardiac enlargement and presents today for further FU.  Planning on right TKA August 2018 with Dr Mardelle Matte. No cardiac complaints. Today, she denies symptoms of palpitations, chest pain, shortness of breath, orthopnea, PND, lower extremity edema, dizziness, or syncope.  No stroke or TIA symptoms. Frustrated with her weight.   Past Medical History:  Diagnosis Date  . Anxiety    controlled  . Atrial septal defect    Small,non significant atrial septal defect with QP/QS of 1.25 by MRA.  . Cancer ([place])   . Migraine    occasionally  . Migraine headache   . Obesity   . Obesity   . Obstructive apnea    CPAP user since 2012.   . OSA on CPAP 09/01/2013  . Pseudotumor cerebri Diagnosed in March 2008-   She sees Dr Clovis Riley in this regard  . Sleep apnea   . Statin intolerance    with elevated CRP    Past Surgical History:  Procedure Laterality Date  . BUNIONECTOMY    . ORTHOPEDIC SURGERY     lumbar surgery   . pin in the fifth metatarsal    . right knee arthroscopy      Current Outpatient Prescriptions  Medication Sig Dispense Refill  . aspirin EC 81 MG tablet Take 1 tablet (81 mg  total) by mouth daily.    . cetirizine (ZYRTEC) 10 MG tablet Take 10 mg by mouth daily.      Marland Kitchen desvenlafaxine (PRISTIQ) 50 MG 24 hr tablet Take 50 mg by mouth daily.      Marland Kitchen levothyroxine (SYNTHROID, LEVOTHROID) 50 MCG tablet Take 1 tablet (50 mcg total) by mouth daily. 90 tablet 3  . Multiple Vitamin (DAILY VITAMIN PO) Take 1 tablet by mouth daily. Mixture of different vitamins    . SUMAtriptan (IMITREX) 100 MG tablet TAKE 1 TABLET (100 MG TOTAL) BY MOUTH ONCE. MAY REPEAT IN 2 HOURS IF HEADACHE PERSISTS OR RECURS. 9 tablet 2  . Co-Enzyme Q10 200 MG CAPS Take 200 mg by mouth daily.    . pravastatin (PRAVACHOL) 40 MG tablet Take 1 tablet (40 mg total) by mouth every evening. 90 tablet 3   No current facility-administered medications for this visit.     Allergies:   Amoxicillin   Social History:  The patient  reports that she has never smoked. She has never used smokeless tobacco. She reports that she drinks alcohol. She reports that she does not use drugs.   Family History:  The patient's  family history includes Alzheimer's disease in her mother; Rheumatic fever in her father.    ROS:  Please see the history of present  illness.  Otherwise, review of systems is positive for joint pains.  All other systems are reviewed and negative.    PHYSICAL EXAM: VS:  BP 136/84   Pulse 83   Ht 5' 8.5" (1.74 m)   Wt 299 lb (135.6 kg)   SpO2 97%   BMI 44.80 kg/m  , BMI Body mass index is 44.8 kg/m. GEN: Well nourished, well developed, pleasant obese woman in no acute distress  HEENT: normal  Neck: no JVD, no masses. No carotid bruits Cardiac: RRR without murmur or gallop                Respiratory:  clear to auscultation bilaterally, normal work of breathing GI: soft, nontender, nondistended, + BS MS: no deformity or atrophy  Ext: no pretibial edema, pedal pulses 2+= bilaterally Skin: warm and dry, no rash Neuro:  Strength and sensation are intact Psych: euthymic mood, full affect  EKG:  EKG  is not ordered today.  Recent Labs: 02/13/2016: ALT 17; BUN 16; Creat 0.76; Hemoglobin 13.3; Platelets 217; Potassium 4.4; Sodium 142; TSH 2.01   Lipid Panel     Component Value Date/Time   CHOL 239 (H) 02/13/2016 1115   TRIG 174 (H) 02/13/2016 1115   HDL 40 (L) 02/13/2016 1115   CHOLHDL 6.0 (H) 02/13/2016 1115   VLDL 35 (H) 02/13/2016 1115   LDLCALC 164 (H) 02/13/2016 1115      Wt Readings from Last 3 Encounters:  06/18/16 299 lb (135.6 kg)  04/02/16 (!) 302 lb 8 oz (137.2 kg)  02/20/16 283 lb (128.4 kg)     Cardiac Studies Reviewed: 06/04/2016: Left ventricle:  The cavity size was normal. Wall thickness was normal. Systolic function was normal. The estimated ejection fraction was in the range of 60% to 65%. The transmitral flow pattern was normal. The deceleration time of the early transmitral flow velocity was normal. The pulmonary vein flow pattern was normal. The tissue Doppler parameters were normal. Left ventricular diastolic function parameters were normal.  ------------------------------------------------------------------- Aortic valve:   Trileaflet; mildly calcified leaflets.  Doppler: There was no stenosis.  ------------------------------------------------------------------- Aorta:  The aorta was normal, not dilated, and non-diseased.  ------------------------------------------------------------------- Mitral valve:   Mildly thickened leaflets .  Doppler:  There was no significant regurgitation.  ------------------------------------------------------------------- Left atrium:  The atrium was mildly dilated.  ------------------------------------------------------------------- Atrial septum:  Poosible PFO If high concern for ASD or shunt patient needs bubble study an or TEE. A patent foramen ovale cannot be excluded.  ------------------------------------------------------------------- Right ventricle:  The cavity size was normal. Wall thickness  was normal. Systolic function was normal.  ------------------------------------------------------------------- Pulmonic valve:    Doppler:  There was trivial regurgitation.  ------------------------------------------------------------------- Tricuspid valve:   Doppler:  There was trivial regurgitation.   ------------------------------------------------------------------- Right atrium:  The atrium was normal in size.  ------------------------------------------------------------------- Pericardium:  The pericardium was normal in appearance.  ------------------------------------------------------------------- Systemic veins: Inferior vena cava: The vessel was normal in size. The respirophasic diameter changes were in the normal range (= 50%), consistent with normal central venous pressure.  ------------------------------------------------------------------- Post procedure conclusions Ascending Aorta:  - The aorta was normal, not dilated, and non-diseased.  ASSESSMENT AND PLAN: 1.  Secundum ASD: small defect, no hemodynamically significant shunt based on normal right heart chambers by echo. Continues on ASA. Continue to follow with periodic imaging every 2-3 years.   2. Hyperlipidemia: reviewed lipids with her today. She had recent screening demonstrating nonobstructive carotid atherosclerosis. LDL well above goal. She is previously intolerant  to crestor because of myalgias. Will try pravastatin 40 mg daily with CoQ 10 and repeat labs in 8 weeks.   3. Morbid obesity BMI > 40: counseling done.   4. Preop eval: for left TKA. She's at low risk of cardiac complications. Needs to follow aggressive DVT prevention post-operatively to prevent DVT and minimize risk of paradoxical embolism.   Current medicines are reviewed with the patient today.  The patient does not have concerns regarding medicines.  Labs/ tests ordered today include:   Orders Placed This Encounter  Procedures  .  Lipid panel  . Hepatic function panel    Disposition:   FU one year  Signed, Sherren Mocha, MD  06/18/2016 5:33 PM    [address] [address], [place], [place]  [address] Phone: [phone]; Fax: [phone]

## 2016-06-18 NOTE — Telephone Encounter (Signed)
PA for PRAVASTATIN done through covermymeds.

## 2016-06-18 NOTE — Patient Instructions (Signed)
Medication Instructions:  Your physician has recommended you make the following change in your medication:  1. START Pravastatin 40mg  take one tablet by mouth daily 2. START CoEnzyme Q10 take 200mg  by mouth daily  Labwork: Your physician recommends that you return for a FASTING LIPID and LIVER in 8 WEEKS--nothing to eat or drink after midnight, lab opens at 7:30 AM  Testing/Procedures: No new orders.   Follow-Up: Your physician wants you to follow-up in: 1 YEAR with Dr Burt Knack.  You will receive a reminder letter in the mail two months in advance. If you don't receive a letter, please call our office to schedule the follow-up appointment.   Any Other Special Instructions Will Be Listed Below (If Applicable).     If you need a refill on your cardiac medications before your next appointment, please call your pharmacy.

## 2016-06-22 NOTE — Telephone Encounter (Signed)
Pravastatin PA has been approved through [employer].  Approval good until 06/18/2017.

## 2016-08-13 ENCOUNTER — Other Ambulatory Visit: Payer: PRIVATE HEALTH INSURANCE

## 2016-09-02 NOTE — Pre-Procedure Instructions (Signed)
Diana [name]  09/02/2016      [id number] - [place], [place] - [address] [place] [address] Phone: [phone] Fax: [phone]  [place], [place]  [address] Phone: [phone] Fax: [phone]    Your procedure is scheduled on : Tuesday, August 28th   Report to [name] Admitting at 5:30 AM   Call this number if you have problems the morning of surgery:  [phone], for other questions, call [phone] Mon-Fri from 8a-4p   Remember:              4-5 days prior to surgery, STOP TAKING any Vitamins, Herbal Supplement, Anti-inflammatories.   Do not eat food or drink liquids after midnight Monday.   Take these medicines the morning of surgery with A SIP OF WATER : Xanax, Thyroid   Do not wear jewelry, make-up or nail polish.  Do not wear lotions, powders, perfumes, or deoderant.  Do not shave 48 hours prior to surgery.     Do not bring valuables to the hospital.  [facility] is not responsible for any belongings or valuables.  Contacts, dentures or bridgework may not be worn into surgery.  Leave your suitcase in the car.  After surgery it may be brought to your room.  For patients admitted to the hospital, discharge time will be determined by your treatment team.  Please read over the following fact sheets that you were given. Pain Booklet, MRSA Information and Surgical Site Infection Prevention       [place]- Preparing For Surgery  Before surgery, you can play an important role. Because skin is not sterile, your skin needs to be as free of germs as possible. You can reduce the number of germs on your skin by washing with CHG (chlorahexidine gluconate) Soap before surgery.  CHG is an antiseptic cleaner which kills germs and bonds with the skin to continue killing germs even after washing.  Please do  not use if you have an allergy to CHG or antibacterial soaps. If your skin becomes reddened/irritated stop using the CHG.  Do not shave (including legs and underarms) for at least 48 hours prior to first CHG shower. It is OK to shave your face.  Please follow these instructions carefully.   1. Shower the NIGHT BEFORE SURGERY and the MORNING OF SURGERY with CHG.   2. If you chose to wash your hair, wash your hair first as usual with your normal shampoo.  3. After you shampoo, rinse your hair and body thoroughly to remove the shampoo.  4. Use CHG as you would any other liquid soap. You can apply CHG directly to the skin and wash gently with a scrungie or a clean washcloth.   5. Apply the CHG Soap to your body ONLY FROM THE NECK DOWN.  Do not use on open wounds or open sores. Avoid contact with your eyes, ears, mouth and genitals (private parts). Wash genitals (private parts) with your normal soap.  6. Wash thoroughly, paying special attention to the area where your surgery will be performed.  7. Thoroughly rinse your body with warm water from the neck down.  8. DO NOT shower/wash with your normal soap after using and rinsing off the CHG Soap.  9. Pat yourself dry with a CLEAN TOWEL.   10. Wear CLEAN PAJAMAS  11. Place CLEAN SHEETS on your bed the night of your first shower and DO NOT SLEEP WITH PETS.    [place] [facility]: Do not apply any deodorants/lotions. Please wear clean clothes to the hospital/surgery center.

## 2016-09-03 ENCOUNTER — Encounter (HOSPITAL_COMMUNITY)
Admission: RE | Admit: 2016-09-03 | Discharge: 2016-09-03 | Disposition: A | Discharge status: Home / Self Care | Payer: PRIVATE HEALTH INSURANCE | Source: Ambulatory Visit | Attending: Orthopedic Surgery | Admitting: Orthopedic Surgery

## 2016-09-03 ENCOUNTER — Encounter (HOSPITAL_COMMUNITY): Payer: Self-pay

## 2016-09-03 DIAGNOSIS — G4733 Obstructive sleep apnea (adult) (pediatric): Secondary | ICD-10-CM | POA: Diagnosis not present

## 2016-09-03 DIAGNOSIS — Q211 Atrial septal defect: Secondary | ICD-10-CM | POA: Insufficient documentation

## 2016-09-03 DIAGNOSIS — Z01812 Encounter for preprocedural laboratory examination: Secondary | ICD-10-CM | POA: Diagnosis present

## 2016-09-03 DIAGNOSIS — Z85820 Personal history of malignant melanoma of skin: Secondary | ICD-10-CM | POA: Diagnosis not present

## 2016-09-03 DIAGNOSIS — Z01810 Encounter for preprocedural cardiovascular examination: Secondary | ICD-10-CM | POA: Insufficient documentation

## 2016-09-03 HISTORY — DX: Other specified postprocedural states: R11.2

## 2016-09-03 HISTORY — DX: Adverse effect of unspecified anesthetic, initial encounter: T41.45XA

## 2016-09-03 HISTORY — DX: Major depressive disorder, single episode, unspecified: F32.9

## 2016-09-03 HISTORY — DX: Other specified postprocedural states: Z98.890

## 2016-09-03 HISTORY — DX: Other complications of anesthesia, initial encounter: T88.59XA

## 2016-09-03 HISTORY — DX: Depression, unspecified: F32.A

## 2016-09-03 LAB — BASIC METABOLIC PANEL
ANION GAP: 7 (ref 5–15)
BUN: 17 mg/dL (ref 6–20)
CHLORIDE: 106 mmol/L (ref 101–111)
CO2: 26 mmol/L (ref 22–32)
Calcium: 8.9 mg/dL (ref 8.9–10.3)
Creatinine, Ser: 0.94 mg/dL (ref 0.44–1.00)
GFR calc non Af Amer: >60 mL/min (ref >60)
Glucose, Bld: 105 mg/dL — ABNORMAL HIGH (ref 65–99)
Potassium: 4.1 mmol/L (ref 3.5–5.1)
Sodium: 139 mmol/L (ref 135–145)

## 2016-09-03 LAB — CBC
HEMATOCRIT: 39.2 % (ref 36.0–46.0)
Hemoglobin: 13.4 g/dL (ref 12.0–15.0)
MCH: 29.3 pg (ref 26.0–34.0)
MCHC: 34.2 g/dL (ref 30.0–36.0)
MCV: 85.8 fL (ref 78.0–100.0)
Platelets: 215 10*3/uL (ref 150–400)
RBC: 4.57 MIL/uL (ref 3.87–5.11)
RDW: 12.6 % (ref 11.5–15.5)
WBC: 7 10*3/uL (ref 4.0–10.5)

## 2016-09-03 LAB — SURGICAL PCR SCREEN
MRSA, PCR: NEGATIVE
STAPHYLOCOCCUS AUREUS: POSITIVE — AB

## 2016-09-03 NOTE — Progress Notes (Signed)
PCP is Dr. Merrilee Jansky. [place]  LOV 01/2016 H/O  ASD  Sees Dr. Sherren Mocha  LOV 04/2016  Echo done 05/2016  EKG done today.  Office note inside chart.   Denies any sx H/o OSA.  Wears CPAP.  Dr. Brett Fairy handles this.

## 2016-09-04 ENCOUNTER — Other Ambulatory Visit (HOSPITAL_COMMUNITY): Payer: PRIVATE HEALTH INSURANCE

## 2016-09-04 NOTE — Progress Notes (Signed)
Anesthesia Chart Review:  Pt is a 62 female scheduled for R unicompartmental knee on 09/15/2016 with Marchia Bond, M.D.  - PCP is Tedra Senegal, MD - Cardiologist is Sherren Mocha, MD who cleared for surgery at ast office visit 06/18/16.  He notes "at low risk of cardiac complications. Needs to follow aggressive DVT prevention post-operatively to prevent DVT and minimize risk of paradoxical embolism."  PMH includes: ASD, OSA, melanoma, pseudotumor cerebri, post-op N/V. Never smoker. BMI 45.  Medications include: ASA 81 mg, armour thyroid  Preoperative labs reviewed.   EKG 09/03/16: NSR. Possible LA enlargement  Echo 06/04/16:  - Left ventricle: The cavity size was normal. Wall thickness was normal. Systolic function was normal. The estimated ejection fraction was in the range of 60% to 65%. Left ventricular diastolic function parameters were normal. - Left atrium: The atrium was mildly dilated. - Atrial septum: Possible PFO If high concern for ASD or shunt patient needs bubble study an or TEE. A patent foramen ovale cannot be excluded. - Impressions: Thinning of the mid and basal atrial septum Possible PFO - Dr. Burt Knack comments on result "Stable findings. Pt with known small ASD. This study was done for surveillance to evaluate for RA/RV enlargement - right sided chamber sizes are normal. Clinical FU as planned."  If no changes, I anticipate pt can proceed with surgery as scheduled.   Willeen Cass, FNP-BC [facility]/Anesthesiology Phone: [phone] 09/04/2016 3:55 PM

## 2016-09-14 ENCOUNTER — Other Ambulatory Visit: Payer: Self-pay | Admitting: Orthopedic Surgery

## 2016-09-14 MED ORDER — DEXTROSE 5 % IV SOLN
3.0000 g | INTRAVENOUS | Status: DC
  Filled 2016-09-14: qty 3000

## 2016-09-15 ENCOUNTER — Observation Stay (HOSPITAL_COMMUNITY)
Admission: RE | Admit: 2016-09-15 | Discharge: 2016-09-15 | Disposition: A | Discharge status: Home / Self Care | Payer: PRIVATE HEALTH INSURANCE | Source: Ambulatory Visit | Attending: Orthopedic Surgery | Admitting: Orthopedic Surgery

## 2016-09-15 ENCOUNTER — Ambulatory Visit (HOSPITAL_COMMUNITY): Payer: PRIVATE HEALTH INSURANCE | Admitting: Emergency Medicine

## 2016-09-15 ENCOUNTER — Observation Stay (HOSPITAL_COMMUNITY): Payer: PRIVATE HEALTH INSURANCE

## 2016-09-15 ENCOUNTER — Encounter (HOSPITAL_COMMUNITY): Payer: Self-pay | Admitting: *Deleted

## 2016-09-15 ENCOUNTER — Encounter (HOSPITAL_COMMUNITY)
Admission: RE | Disposition: A | Discharge status: Home / Self Care | Payer: Self-pay | Source: Ambulatory Visit | Attending: Orthopedic Surgery

## 2016-09-15 ENCOUNTER — Ambulatory Visit (HOSPITAL_COMMUNITY): Payer: PRIVATE HEALTH INSURANCE | Admitting: Anesthesiology

## 2016-09-15 DIAGNOSIS — Z881 Allergy status to other antibiotic agents status: Secondary | ICD-10-CM | POA: Insufficient documentation

## 2016-09-15 DIAGNOSIS — Z6841 Body Mass Index (BMI) 40.0 and over, adult: Secondary | ICD-10-CM | POA: Diagnosis not present

## 2016-09-15 DIAGNOSIS — F419 Anxiety disorder, unspecified: Secondary | ICD-10-CM | POA: Diagnosis not present

## 2016-09-15 DIAGNOSIS — Z880 Allergy status to penicillin: Secondary | ICD-10-CM | POA: Insufficient documentation

## 2016-09-15 DIAGNOSIS — M1711 Unilateral primary osteoarthritis, right knee: Secondary | ICD-10-CM | POA: Diagnosis present

## 2016-09-15 DIAGNOSIS — G4733 Obstructive sleep apnea (adult) (pediatric): Secondary | ICD-10-CM | POA: Insufficient documentation

## 2016-09-15 DIAGNOSIS — Z96651 Presence of right artificial knee joint: Secondary | ICD-10-CM

## 2016-09-15 DIAGNOSIS — Q211 Atrial septal defect: Secondary | ICD-10-CM | POA: Insufficient documentation

## 2016-09-15 DIAGNOSIS — Z9989 Dependence on other enabling machines and devices: Secondary | ICD-10-CM | POA: Insufficient documentation

## 2016-09-15 DIAGNOSIS — E039 Hypothyroidism, unspecified: Secondary | ICD-10-CM | POA: Insufficient documentation

## 2016-09-15 DIAGNOSIS — Z85820 Personal history of malignant melanoma of skin: Secondary | ICD-10-CM | POA: Diagnosis not present

## 2016-09-15 DIAGNOSIS — F329 Major depressive disorder, single episode, unspecified: Secondary | ICD-10-CM | POA: Diagnosis not present

## 2016-09-15 DIAGNOSIS — Z96659 Presence of unspecified artificial knee joint: Secondary | ICD-10-CM

## 2016-09-15 DIAGNOSIS — M25761 Osteophyte, right knee: Secondary | ICD-10-CM | POA: Diagnosis not present

## 2016-09-15 HISTORY — PX: PARTIAL KNEE ARTHROPLASTY: SHX2174

## 2016-09-15 SURGERY — ARTHROPLASTY, KNEE, UNICOMPARTMENTAL
Anesthesia: Monitor Anesthesia Care | Laterality: Right

## 2016-09-15 MED ORDER — ROPIVACAINE HCL 7.5 MG/ML IJ SOLN
INTRAMUSCULAR | Status: DC | PRN
  Administered 2016-09-15: 20 mL via PERINEURAL

## 2016-09-15 MED ORDER — BUPIVACAINE IN DEXTROSE 0.75-8.25 % IT SOLN
INTRATHECAL | Status: DC | PRN
  Administered 2016-09-15: 2 mL via INTRATHECAL

## 2016-09-15 MED ORDER — ALPRAZOLAM 0.5 MG PO TABS
0.5000 mg | ORAL_TABLET | Freq: Every day | ORAL | Status: DC | PRN
Start: 2016-09-15 — End: 2016-09-15

## 2016-09-15 MED ORDER — ESTRADIOL 0.1 MG/24HR TD PTWK: 0.1000 mg | MEDICATED_PATCH | TRANSDERMAL | Status: DC

## 2016-09-15 MED ORDER — METHOCARBAMOL 1000 MG/10ML IJ SOLN
500.0000 mg | Freq: Four times a day (QID) | INTRAVENOUS | Status: DC | PRN
  Filled 2016-09-15: qty 5

## 2016-09-15 MED ORDER — PROPOFOL 1000 MG/100ML IV EMUL
INTRAVENOUS | Status: AC
  Filled 2016-09-15: qty 100

## 2016-09-15 MED ORDER — KETOROLAC TROMETHAMINE 15 MG/ML IJ SOLN
INTRAMUSCULAR | Status: AC
  Administered 2016-09-15: 7.5 mg via INTRAVENOUS
  Filled 2016-09-15: qty 1

## 2016-09-15 MED ORDER — VANCOMYCIN HCL IN DEXTROSE 1-5 GM/200ML-% IV SOLN
1000.0000 mg | INTRAVENOUS | Status: AC
  Administered 2016-09-15: 1000 mg via INTRAVENOUS

## 2016-09-15 MED ORDER — MENTHOL 3 MG MT LOZG: 1.0000 | LOZENGE | OROMUCOSAL | Status: DC | PRN

## 2016-09-15 MED ORDER — FENTANYL CITRATE (PF) 100 MCG/2ML IJ SOLN
INTRAMUSCULAR | Status: DC | PRN
  Administered 2016-09-15: 50 ug via INTRAVENOUS

## 2016-09-15 MED ORDER — BISACODYL 10 MG RE SUPP: 10.0000 mg | Freq: Every day | RECTAL | Status: DC | PRN

## 2016-09-15 MED ORDER — METOCLOPRAMIDE HCL 5 MG PO TABS: 5.0000 mg | ORAL_TABLET | Freq: Three times a day (TID) | ORAL | Status: DC | PRN

## 2016-09-15 MED ORDER — DEXAMETHASONE SODIUM PHOSPHATE 10 MG/ML IJ SOLN
INTRAMUSCULAR | Status: DC | PRN
  Administered 2016-09-15: 10 mg via INTRAVENOUS

## 2016-09-15 MED ORDER — TRAMADOL HCL 50 MG PO TABS: 50.0000 mg | ORAL_TABLET | Freq: Four times a day (QID) | ORAL | 0 refills | Status: DC | PRN

## 2016-09-15 MED ORDER — LACTATED RINGERS IV SOLN
INTRAVENOUS | Status: DC | PRN
  Administered 2016-09-15 (×2): via INTRAVENOUS

## 2016-09-15 MED ORDER — PROPOFOL 10 MG/ML IV BOLUS
INTRAVENOUS | Status: AC
  Filled 2016-09-15: qty 20

## 2016-09-15 MED ORDER — PHENOL 1.4 % MT LIQD: 1.0000 | OROMUCOSAL | Status: DC | PRN

## 2016-09-15 MED ORDER — ACETAMINOPHEN 325 MG PO TABS: 650.0000 mg | ORAL_TABLET | Freq: Four times a day (QID) | ORAL | Status: DC | PRN

## 2016-09-15 MED ORDER — METOCLOPRAMIDE HCL 5 MG/ML IJ SOLN: 5.0000 mg | Freq: Three times a day (TID) | INTRAMUSCULAR | Status: DC | PRN

## 2016-09-15 MED ORDER — DIPHENHYDRAMINE HCL 50 MG/ML IJ SOLN
INTRAMUSCULAR | Status: DC | PRN
  Administered 2016-09-15: 12.5 mg via INTRAVENOUS

## 2016-09-15 MED ORDER — VENLAFAXINE HCL ER 150 MG PO CP24: 150.0000 mg | ORAL_CAPSULE | Freq: Every day | ORAL | Status: DC

## 2016-09-15 MED ORDER — SENNA-DOCUSATE SODIUM 8.6-50 MG PO TABS: 2.0000 | ORAL_TABLET | Freq: Every day | ORAL | 1 refills | Status: DC

## 2016-09-15 MED ORDER — DIPHENHYDRAMINE HCL 12.5 MG/5ML PO ELIX: 12.5000 mg | ORAL_SOLUTION | ORAL | Status: DC | PRN

## 2016-09-15 MED ORDER — RIVAROXABAN 10 MG PO TABS: 10.0000 mg | ORAL_TABLET | Freq: Every day | ORAL | Status: DC

## 2016-09-15 MED ORDER — THYROID 60 MG PO TABS: 65.0000 mg | ORAL_TABLET | Freq: Every day | ORAL | Status: DC

## 2016-09-15 MED ORDER — DEXAMETHASONE SODIUM PHOSPHATE 10 MG/ML IJ SOLN: 10.0000 mg | Freq: Once | INTRAMUSCULAR | Status: DC

## 2016-09-15 MED ORDER — RIVAROXABAN 10 MG PO TABS: 10.0000 mg | ORAL_TABLET | Freq: Every day | ORAL | 0 refills | Status: DC

## 2016-09-15 MED ORDER — POLYETHYLENE GLYCOL 3350 17 G PO PACK: 17.0000 g | PACK | Freq: Every day | ORAL | Status: DC | PRN

## 2016-09-15 MED ORDER — MIDAZOLAM HCL 2 MG/2ML IJ SOLN
INTRAMUSCULAR | Status: AC
  Filled 2016-09-15: qty 2

## 2016-09-15 MED ORDER — TRAMADOL HCL 50 MG PO TABS: 50.0000 mg | ORAL_TABLET | Freq: Four times a day (QID) | ORAL | Status: DC | PRN

## 2016-09-15 MED ORDER — HYDROMORPHONE HCL 1 MG/ML IJ SOLN: 0.5000 mg | INTRAMUSCULAR | Status: DC | PRN

## 2016-09-15 MED ORDER — OXYCODONE HCL 5 MG PO TABS: 5.0000 mg | ORAL_TABLET | ORAL | 0 refills | Status: DC | PRN

## 2016-09-15 MED ORDER — OXYCODONE HCL 5 MG PO TABS
ORAL_TABLET | ORAL | Status: AC
  Administered 2016-09-15: 10 mg via ORAL
  Filled 2016-09-15: qty 2

## 2016-09-15 MED ORDER — MAGNESIUM CITRATE PO SOLN: 1.0000 | Freq: Once | ORAL | Status: DC | PRN

## 2016-09-15 MED ORDER — ALUM & MAG HYDROXIDE-SIMETH 200-200-20 MG/5ML PO SUSP: 30.0000 mL | ORAL | Status: DC | PRN

## 2016-09-15 MED ORDER — METHOCARBAMOL 500 MG PO TABS
ORAL_TABLET | ORAL | Status: AC
  Administered 2016-09-15: 500 mg via ORAL
  Filled 2016-09-15: qty 1

## 2016-09-15 MED ORDER — PROPOFOL 500 MG/50ML IV EMUL
INTRAVENOUS | Status: DC | PRN
  Administered 2016-09-15: 75 ug/kg/min via INTRAVENOUS

## 2016-09-15 MED ORDER — ADULT MULTIVITAMIN W/MINERALS CH: 1.0000 | ORAL_TABLET | Freq: Every day | ORAL | Status: DC

## 2016-09-15 MED ORDER — VANCOMYCIN HCL IN DEXTROSE 1-5 GM/200ML-% IV SOLN
INTRAVENOUS | Status: AC
  Filled 2016-09-15: qty 200

## 2016-09-15 MED ORDER — ONDANSETRON HCL 4 MG/2ML IJ SOLN: 4.0000 mg | Freq: Four times a day (QID) | INTRAMUSCULAR | Status: DC | PRN

## 2016-09-15 MED ORDER — ONDANSETRON HCL 4 MG PO TABS: 4.0000 mg | ORAL_TABLET | Freq: Three times a day (TID) | ORAL | 0 refills | Status: DC | PRN

## 2016-09-15 MED ORDER — SENNA 8.6 MG PO TABS: 1.0000 | ORAL_TABLET | Freq: Two times a day (BID) | ORAL | Status: DC

## 2016-09-15 MED ORDER — ONDANSETRON HCL 4 MG PO TABS: 4.0000 mg | ORAL_TABLET | Freq: Four times a day (QID) | ORAL | Status: DC | PRN

## 2016-09-15 MED ORDER — METHOCARBAMOL 500 MG PO TABS
500.0000 mg | ORAL_TABLET | Freq: Four times a day (QID) | ORAL | Status: DC | PRN
Start: 2016-09-15 — End: 2016-09-15
  Administered 2016-09-15: 500 mg via ORAL

## 2016-09-15 MED ORDER — ACETAMINOPHEN 650 MG RE SUPP: 650.0000 mg | Freq: Four times a day (QID) | RECTAL | Status: DC | PRN

## 2016-09-15 MED ORDER — MIDAZOLAM HCL 5 MG/5ML IJ SOLN
INTRAMUSCULAR | Status: DC | PRN
  Administered 2016-09-15: 2 mg via INTRAVENOUS

## 2016-09-15 MED ORDER — 0.9 % SODIUM CHLORIDE (POUR BTL) OPTIME
TOPICAL | Status: DC | PRN
  Administered 2016-09-15: 1000 mL

## 2016-09-15 MED ORDER — LIDOCAINE 2% (20 MG/ML) 5 ML SYRINGE
INTRAMUSCULAR | Status: AC
  Filled 2016-09-15: qty 5

## 2016-09-15 MED ORDER — OXYCODONE HCL 5 MG PO TABS
5.0000 mg | ORAL_TABLET | ORAL | Status: DC | PRN
  Administered 2016-09-15: 10 mg via ORAL

## 2016-09-15 MED ORDER — ONDANSETRON HCL 4 MG/2ML IJ SOLN
INTRAMUSCULAR | Status: DC | PRN
  Administered 2016-09-15: 4 mg via INTRAVENOUS

## 2016-09-15 MED ORDER — KETOROLAC TROMETHAMINE 15 MG/ML IJ SOLN
7.5000 mg | Freq: Four times a day (QID) | INTRAMUSCULAR | Status: DC
  Administered 2016-09-15: 7.5 mg via INTRAVENOUS

## 2016-09-15 MED ORDER — POTASSIUM CHLORIDE IN NACL 20-0.45 MEQ/L-% IV SOLN
INTRAVENOUS | Status: DC
  Filled 2016-09-15: qty 1000

## 2016-09-15 MED ORDER — VANCOMYCIN HCL IN DEXTROSE 1-5 GM/200ML-% IV SOLN
1000.0000 mg | Freq: Two times a day (BID) | INTRAVENOUS | Status: DC
  Filled 2016-09-15: qty 200

## 2016-09-15 MED ORDER — DOCUSATE SODIUM 100 MG PO CAPS: 100.0000 mg | ORAL_CAPSULE | Freq: Two times a day (BID) | ORAL | Status: DC

## 2016-09-15 MED ORDER — BACLOFEN 10 MG PO TABS: 10.0000 mg | ORAL_TABLET | Freq: Three times a day (TID) | ORAL | 0 refills | Status: DC

## 2016-09-15 MED ORDER — FENTANYL CITRATE (PF) 250 MCG/5ML IJ SOLN
INTRAMUSCULAR | Status: AC
  Filled 2016-09-15: qty 5

## 2016-09-15 MED ORDER — SODIUM CHLORIDE 0.9 % IR SOLN
Status: DC | PRN
  Administered 2016-09-15: 3000 mL

## 2016-09-15 MED ORDER — PROGESTERONE MICRONIZED 100 MG PO CAPS
200.0000 mg | ORAL_CAPSULE | Freq: Every day | ORAL | Status: DC
  Filled 2016-09-15: qty 2

## 2016-09-15 MED ORDER — LORATADINE 10 MG PO TABS: 10.0000 mg | ORAL_TABLET | Freq: Every day | ORAL | Status: DC

## 2016-09-15 SURGICAL SUPPLY — 61 items
BANDAGE ELASTIC 6 VELCRO ST LF (GAUZE/BANDAGES/DRESSINGS) ×2 IMPLANT
BANDAGE ESMARK 6X9 LF (GAUZE/BANDAGES/DRESSINGS) ×1 IMPLANT
BNDG CMPR 9X6 STRL LF SNTH (GAUZE/BANDAGES/DRESSINGS) ×1
BNDG CMPR MED 15X6 ELC VLCR LF (GAUZE/BANDAGES/DRESSINGS) ×1
BNDG ELASTIC 6X15 VLCR STRL LF (GAUZE/BANDAGES/DRESSINGS) ×3 IMPLANT
BNDG ESMARK 6X9 LF (GAUZE/BANDAGES/DRESSINGS) ×3
BOWL SMART MIX CTS (DISPOSABLE) ×3 IMPLANT
CAP KNEE PARTIAL 2 IMPLANT
CAPT KNEE PARTIAL 2 ×3 IMPLANT
CEMENT HV SMART SET (Cement) ×3 IMPLANT
CLOSURE STERI-STRIP 1/2X4 (GAUZE/BANDAGES/DRESSINGS) ×1
CLSR STERI-STRIP ANTIMIC 1/2X4 (GAUZE/BANDAGES/DRESSINGS) ×2 IMPLANT
COVER SURGICAL LIGHT HANDLE (MISCELLANEOUS) ×3 IMPLANT
CUFF TOURNIQUET SINGLE 44IN (TOURNIQUET CUFF) ×2 IMPLANT
DRAPE EXTREMITY T 121X128X90 (DRAPE) ×2 IMPLANT
DRAPE HALF SHEET 40X57 (DRAPES) ×3 IMPLANT
DRAPE U-SHAPE 47X51 STRL (DRAPES) ×3 IMPLANT
DRSG PAD ABDOMINAL 8X10 ST (GAUZE/BANDAGES/DRESSINGS) ×2 IMPLANT
DURAPREP 26ML APPLICATOR (WOUND CARE) ×3 IMPLANT
ELECT CAUTERY BLADE 6.4 (BLADE) ×3 IMPLANT
ELECT REM PT RETURN 9FT ADLT (ELECTROSURGICAL) ×3
ELECTRODE REM PT RTRN 9FT ADLT (ELECTROSURGICAL) ×1 IMPLANT
GAUZE SPONGE 4X4 12PLY STRL (GAUZE/BANDAGES/DRESSINGS) ×3 IMPLANT
GAUZE SPONGE 4X4 12PLY STRL LF (GAUZE/BANDAGES/DRESSINGS) ×2 IMPLANT
GLOVE BIOGEL PI IND STRL 6.5 (GLOVE) IMPLANT
GLOVE BIOGEL PI INDICATOR 6.5 (GLOVE) ×4
GLOVE BIOGEL PI ORTHO PRO SZ8 (GLOVE) ×4
GLOVE ORTHO TXT STRL SZ7.5 (GLOVE) ×3 IMPLANT
GLOVE PI ORTHO PRO STRL SZ8 (GLOVE) ×2 IMPLANT
GLOVE SURG ORTHO 8.0 STRL STRW (GLOVE) ×3 IMPLANT
GLOVE SURG SS PI 6.0 STRL IVOR (GLOVE) ×2 IMPLANT
GOWN STRL REUS W/ TWL XL LVL3 (GOWN DISPOSABLE) ×1 IMPLANT
GOWN STRL REUS W/TWL 2XL LVL3 (GOWN DISPOSABLE) ×3 IMPLANT
GOWN STRL REUS W/TWL XL LVL3 (GOWN DISPOSABLE) ×3
HANDPIECE INTERPULSE COAX TIP (DISPOSABLE) ×3
HOOD PEEL AWAY FACE SHEILD DIS (HOOD) ×6 IMPLANT
IMMOBILIZER KNEE 22 (SOFTGOODS) ×2 IMPLANT
IMMOBILIZER KNEE 22 UNIV (SOFTGOODS) ×3 IMPLANT
KIT BASIN OR (CUSTOM PROCEDURE TRAY) ×3 IMPLANT
KIT ROOM TURNOVER OR (KITS) ×3 IMPLANT
MANIFOLD NEPTUNE II (INSTRUMENTS) ×3 IMPLANT
NDL HYPO 21X1.5 SAFETY (NEEDLE) IMPLANT
NEEDLE HYPO 21X1.5 SAFETY (NEEDLE) IMPLANT
NS IRRIG 1000ML POUR BTL (IV SOLUTION) ×3 IMPLANT
PACK BLADE SAW RECIP 70 3 PT (BLADE) ×2 IMPLANT
PACK TOTAL JOINT (CUSTOM PROCEDURE TRAY) ×3 IMPLANT
PAD ABD 8X10 STRL (GAUZE/BANDAGES/DRESSINGS) ×3 IMPLANT
PAD ARMBOARD 7.5X6 YLW CONV (MISCELLANEOUS) ×6 IMPLANT
PAD CAST 4YDX4 CTTN HI CHSV (CAST SUPPLIES) ×1 IMPLANT
PADDING CAST COTTON 4X4 STRL (CAST SUPPLIES) ×3
PADDING CAST COTTON 6X4 STRL (CAST SUPPLIES) ×3 IMPLANT
SET HNDPC FAN SPRY TIP SCT (DISPOSABLE) ×1 IMPLANT
SUCTION FRAZIER HANDLE 10FR (MISCELLANEOUS) ×2
SUCTION TUBE FRAZIER 10FR DISP (MISCELLANEOUS) ×1 IMPLANT
SUT VIC AB 0 CT1 27 (SUTURE) ×3
SUT VIC AB 0 CT1 27XBRD ANBCTR (SUTURE) ×1 IMPLANT
SUT VIC AB 1 CT1 27 (SUTURE) ×3
SUT VIC AB 1 CT1 27XBRD ANBCTR (SUTURE) ×1 IMPLANT
SUT VIC AB 3-0 SH 8-18 (SUTURE) ×3 IMPLANT
TOWEL OR 17X26 10 PK STRL BLUE (TOWEL DISPOSABLE) ×3 IMPLANT
TRAY CATH 16FR W/PLASTIC CATH (SET/KITS/TRAYS/PACK) ×2 IMPLANT

## 2016-09-15 NOTE — Op Note (Signed)
09/15/2016  10:03 AM  PATIENT:  Diana [name]    PRE-OPERATIVE DIAGNOSIS:  Right knee anteromedial osteoarthritis  POST-OPERATIVE DIAGNOSIS:  Same  PROCEDURE:  UNICOMPARTMENTAL RIGHT KNEE REPLACEMENT  SURGEON:  Johnny Bridge, MD  PHYSICIAN ASSISTANT: Joya Gaskins, OPA-C, present and scrubbed throughout the case, critical for completion in a timely fashion, and for retraction, instrumentation, and closure.  ANESTHESIA:   General  ESTIMATED BLOOD LOSS: 200 mL  PREOPERATIVE INDICATIONS:  Diana [name] is a  61 y.o. female with a diagnosis of right knee primary localized osteoarthritis who failed conservative measures and elected for surgical management.    The risks benefits and alternatives were discussed with the patient preoperatively including but not limited to the risks of infection, bleeding, nerve injury, cardiopulmonary complications, blood clots, the need for revision surgery, among others, and the patient was willing to proceed.  OPERATIVE IMPLANTS: Biomet Oxford mobile bearing medial compartment arthroplasty femur size small, tibia size D, bearing size 4.  OPERATIVE FINDINGS: Endstage grade 4 medial compartment osteoarthritis. No significant changes in the lateral or patellofemoral joint.  There was substantial medial hypertrophic osteophyte formation on the femoral condyle as well as the patella. The lateral compartment had a groove where the tibial spine was engaging, but there was still cartilage present throughout the lateral condyle. The patella had some medial chondral changes as well on the facet, but laterally was still in good condition and the femoral trochlea was still relatively well preserved. The medial condyle was completely eburnated, and had extensive changes. The ACL was intact.  Unique aspects of the case: Assessing the size of the femur was fairly challenging, as there was extensive medial erosion of the bone, as well as grooving, and a track linearly  of medial bone loss that made referencing the posterior aspect of the condyle very difficult. Nonetheless, I do think she was a small, because the posterior femoral cut was fairly thin, and a medium would've taken even less bone. The tibia may have had still a fair amount of osteophyte posteriorly that we could not remove, that might account for the larger size. Nonetheless, given her obesity and BMI of 44, I wanted to have good cortical support so I went with the larger tibial size. The posterior osteophyte chisel did not get much bone, although I could palpate some degree of posterior osteophyte, although it did not appear to impinge on the polyethylene in deep flexion, although deep flexion was significantly inhibited by her posterior adipose. Positioning in the legholder was also fairly challenging, and she did not drop completely to 110, and stopped approximately at 90.  I utilized the tourniquet for the beginning portion of the case, however approximately halfway through the case it was released, because it appeared to be more of a venous tourniquet and then anything else. This was released at 40 minutes.  OPERATIVE PROCEDURE: The patient was brought to the operating room placed in supine position. Spinal anesthesia was administered with a regional block. IV antibiotics were given in the form of vancomycin given her allergy to cephalosporins. The lower extremity was placed in the legholder and prepped and draped in usual sterile fashion.  Time out was performed.  The leg was elevated and exsanguinated and the tourniquet was inflated. Anteromedial incision was performed, and I took care to preserve the MCL. Parapatellar incision was carried out, and the osteophytes were excised, along with the medial meniscus and a small portion of the fat pad.  The extra medullary tibial cutting  jig was applied, using the spoon and the 46mm G-Clamp and the 2 mm shim, and I took care to protect the anterior cruciate  ligament insertion and the tibial spine. The medial collateral ligament was also protected, and I resected my proximal tibia, matching the anatomic slope.   The proximal tibial bony cut was removed in one piece, and I turned my attention to the femur. There was however a small bit of subchondral bone in a rim along the posterior medial and posterior aspect that I had to cut again with the saw.    The intramedullary femoral rod was placed using the drill, and then using the appropriate reference, I assembled the femoral jig, setting my posterior cutting block. I resected my posterior femur, used the 0 spigot for the anterior femur, and then measured my gap.   I then used the appropriate mill to match the extension gap to the flexion gap. The second milling was at a 4 and then at 5.  The gaps were then measured again with the appropriate feeler gauges. Once I had balanced flexion and extension gaps, I then completed the preparation of the femur.  I milled off the anterior aspect of the distal femur to prevent impingement. I also exposed the tibia, and selected the above-named component, and then used the cutting jig to prepare the keel slot on the tibia. I also used the awl to curette out the bone to complete the preparation of the keel. The back wall was intact.  I then placed trial components, and it was found to have excellent motion, and appropriate balance.  I then cemented the components into place, cementing the tibia first, removing all excess cement, and then cementing the femur.  All loose cement was removed.  The real polyethylene insert was applied manually, and the knee was taken through functional range of motion, and found to have excellent stability and restoration of joint motion, with excellent balance.  The wounds were irrigated copiously, and the parapatellar tissue closed with Vicryl, followed by Vicryl for the subcutaneous tissue, with routine closure with Steri-Strips and sterile  gauze.  The tourniquet was released, and the patient was awakened and extubated and returned to PACU in stable and satisfactory condition. There were no complications.

## 2016-09-15 NOTE — Progress Notes (Addendum)
Received call-back from Roger Kill in regards to pt wanting to leave same day of surgery. Received verbal confirmation that pt is fine to leave today and to place D/C order. Orders placed and pt/husband updated on plan. Will continue to monitor  Notified by Brittany-PT that pt will need a RW for home use. Paged on-call CM but did not receive call-back. Pt stated she felt safe/capable of being discharged without RW for home use tonight. Talked with Roger Kill in regards to DME, and again received verbal confirmation that he felt comfortable with pt D/C tonight without DME and to follow-up in the office and with OP-PT. Family/husband updated on plan and verbalized understanding. Will continue to monitor

## 2016-09-15 NOTE — Progress Notes (Signed)
Dr Ermalene Postin updated that spinal has worn off, pt had been inc. Of urine, and aware of bruising on right upper arm from BP cuff. Said he would be by to see pt.

## 2016-09-15 NOTE — H&P (Signed)
PREOPERATIVE H&P  Chief Complaint: OA RIGHT KNEE  HPI: Diana [name] is a 61 y.o. female who presents for preoperative history and physical with a diagnosis of OA RIGHT KNEE. Symptoms are rated as moderate to severe, and have been worsening.  This is significantly impairing activities of daily living.  She has elected for surgical management.   She has failed injections, activity modification, anti-inflammatories, and assistive devices.  Preoperative X-rays demonstrate end stage degenerative changes with osteophyte formation, loss of joint space, subchondral sclerosis.   Past Medical History:  Diagnosis Date  . Anxiety    controlled  . Atrial septal defect    Small,non significant atrial septal defect with QP/QS of 1.25 by MRA.  . Cancer ([place])    melanoma left leg 2012  . Complication of anesthesia    gets migraine when she has to be NPO  . Depression   . Migraine    occasionally  . Migraine headache   . Obesity   . Obesity   . Obstructive apnea    CPAP user since 2012.   . OSA on CPAP 09/01/2013  . PONV (postoperative nausea and vomiting)    n/v from migraine, not anesthesia  . Pseudotumor cerebri Diagnosed in March 2008-   She sees Dr Clovis Riley in this regard  . Sleep apnea   . Statin intolerance    with elevated CRP   Past Surgical History:  Procedure Laterality Date  . BACK SURGERY    . BUNIONECTOMY    . FRACTURE SURGERY     left 5th metatarsal  . ORTHOPEDIC SURGERY     lumbar surgery   . pin in the fifth metatarsal    . right knee arthroscopy     Social History   Social History  . Marital status: Married    Spouse name: Jeneen Rinks  . Number of children: 0  . Years of education: masters   Occupational History  .  Hand & Orthopaedic Specialist    [employer]   Social History Main Topics  . Smoking status: Never Smoker  . Smokeless tobacco: Never Used  . Alcohol use Yes     Comment: one glass of wine on weekends  . Drug use: No  . Sexual activity: Not  Asked   Other Topics Concern  . 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 She was referred for evaluation of what was previously been diagnosed as an ASD in 2001 at [facility]. She was advised to have it followed medically. She is marrried and lives with her spouse . She may have two glasses  of wine a week. She does not use tobacco. She sleeps with a CPAP.   Caffeine Use: 1-2 cups daily   Family History  Problem Relation Age of Onset  . Alzheimer's disease Mother   . Rheumatic fever Father    Allergies  Allergen Reactions  . Augmentin [Amoxicillin-Pot Clavulanate] Nausea And Vomiting    Has patient had a PCN reaction causing immediate rash, facial/tongue/throat swelling, SOB or lightheadedness with hypotension: No Has patient had a PCN reaction causing severe rash involving mucus membranes or skin necrosis: No Has patient had a PCN reaction that required hospitalization: No Has patient had a PCN reaction occurring within the last 10 years: #  #  #  YES  #  #  #  > 2014 If all of the above answers are "NO", then may  proceed with Cephalosporin use.    Prior to Admission medications   Medication Sig Start Date End Date Taking? Authorizing Provider  ALPRAZolam Duanne Moron) 0.5 MG tablet Take 0.5 mg by mouth daily as needed for anxiety. 07/06/16  Yes [provider]  aspirin EC 81 MG tablet Take 1 tablet (81 mg total) by mouth daily. 03/23/13  Yes Sherren Mocha, MD  cetirizine (ZYRTEC) 10 MG tablet Take 10 mg by mouth daily.     Yes [provider]  estradiol (VIVELLE-DOT) 0.075 MG/24HR Place 1 patch onto the skin 2 (two) times a week. Rotating sites 07/09/16  Yes [provider]  Multiple Vitamin (MULTI-VITAMIN DAILY) TABS Take by mouth See admin instructions. doterra multi - takes 2 tablets of 3 different multis for a total of 6 tabs   Yes [provider]  NONFORMULARY OR COMPOUNDED ITEM Place 1  application onto the skin at bedtime. Testosterone cream   Yes [provider]  OVER THE COUNTER MEDICATION Take 1 tablet by mouth 3 (three) times daily as needed (to prevent sickness). onguard   Yes [provider]  PRISTIQ 100 MG 24 hr tablet Take 100 mg by mouth at bedtime. 07/06/16  Yes [provider]  progesterone (PROMETRIUM) 100 MG capsule Take 200 mg by mouth at bedtime. 08/18/16  Yes [provider]  SUMAtriptan (IMITREX) 100 MG tablet TAKE 1 TABLET (100 MG TOTAL) BY MOUTH ONCE. MAY REPEAT IN 2 HOURS IF HEADACHE PERSISTS OR RECURS. 02/20/16  Yes Baxley, Cresenciano Lick, MD  thyroid (ARMOUR) 65 MG tablet Take 65 mg by mouth daily.   Yes [provider]  Co-Enzyme Q10 200 MG CAPS Take 200 mg by mouth daily. Patient not taking: Reported on 09/01/2016 06/18/16   Sherren Mocha, MD  levothyroxine (SYNTHROID, LEVOTHROID) 50 MCG tablet Take 1 tablet (50 mcg total) by mouth daily. Patient not taking: Reported on 09/01/2016 02/20/16   Elby Showers, MD  pravastatin (PRAVACHOL) 40 MG tablet Take 1 tablet (40 mg total) by mouth every evening. Patient not taking: Reported on 09/01/2016 06/18/16 09/16/16  Sherren Mocha, MD     Positive ROS: All other systems have been reviewed and were otherwise negative with the exception of those mentioned in the HPI and as above.  Physical Exam: General: Alert, no acute distress Cardiovascular: No pedal edema Respiratory: No cyanosis, no use of accessory musculature GI: No organomegaly, abdomen is soft and non-tender Skin: No lesions in the area of chief complaint Neurologic: Sensation intact distally Psychiatric: Patient is competent for consent with normal mood and affect Lymphatic: No axillary or cervical lymphadenopathy  MUSCULOSKELETAL: right knee AROM 0-120 with medial crepitance and painful arc.  Assessment: OA RIGHT KNEE   Plan: Plan for Procedure(s): UNICOMPARTMENTAL KNEE vs. TKA  The risks benefits and  alternatives were discussed with the patient including but not limited to the risks of nonoperative treatment, versus surgical intervention including infection, bleeding, nerve injury,  blood clots, cardiopulmonary complications, morbidity, mortality, among others, and they were willing to proceed.   Johnny Bridge, MD Cell [phone]   09/15/2016 6:30 AM

## 2016-09-15 NOTE — Evaluation (Signed)
Physical Therapy Evaluation Patient Details Name: Diana [name] MRN: [id number] DOB: 03-17-1955 Today's Date: 09/15/2016   History of Present Illness  Pt is a 61 y/o female s/p elective R unicompartmental knee replacement. PMH includes anxiety, melanoma, depression, migraine, OSA on cpapa, back usrgery, pseudotumor cerebri, obesity, and L 5th metatarsal surgery.   Clinical Impression  Pt s/p surgery above with deficits below. PTA, pt was independent with mobility and was working as a PT in a Advertising account executive clinic. Upon eval, pt limited by post op pain and weakness. Required min guard for mobility this session. Reports husband will be available to assist as needed and will need RW at home. Reports she will be getting outpatient PT at d/c. Will continue to follow acutely to maximize functional mobility independence and safety.     Follow Up Recommendations DC plan and follow up therapy as arranged by surgeon;Supervision for mobility/OOB    Equipment Recommendations  Rolling walker with 5" wheels    Recommendations for Other Services       Precautions / Restrictions Precautions Precautions: Knee Precaution Booklet Issued: Yes (comment) Precaution Comments: Verbally reviewed handout. Pt to leave tonight, so did not want to review.  Restrictions Weight Bearing Restrictions: Yes RLE Weight Bearing: Weight bearing as tolerated      Mobility  Bed Mobility               General bed mobility comments: In chair upon entry   Transfers Overall transfer level: Needs assistance Equipment used: Rolling walker (2 wheeled) Transfers: Sit to/from Stand Sit to Stand: Min guard         General transfer comment: Min guard for safety. Demonstrated safe hand placement.   Ambulation/Gait Ambulation/Gait assistance: Min guard Ambulation Distance (Feet): 200 Feet Assistive device: Rolling walker (2 wheeled) Gait Pattern/deviations: Step-through pattern;Decreased step length -  right;Decreased step length - left;Decreased weight shift to right;Antalgic Gait velocity: Decreased  Gait velocity interpretation: Below normal speed for age/gender General Gait Details: Slow, slightly antalgic gait. Verbal cues for sequencing with RW. Overall steady.   Stairs Stairs: Yes Stairs assistance: Min guard Stair Management: Step to pattern;Backwards;Forwards;With walker Number of Stairs: 2 General stair comments: Reviewed stair navigation using RW with pt and husband. Practiced guarding and stair navigation. Verbal cues for LE sequencing and placement of RW during stair management.   Wheelchair Mobility    Modified Rankin (Stroke Patients Only)       Balance Overall balance assessment: Needs assistance Sitting-balance support: No upper extremity supported;Feet supported Sitting balance-Leahy Scale: Good     Standing balance support: Bilateral upper extremity supported;No upper extremity supported;During functional activity Standing balance-Leahy Scale: Fair Standing balance comment: Able to maintain static standing without UE support                              Pertinent Vitals/Pain Pain Assessment: 0-10 Pain Score: 1  Pain Location: R knee  Pain Descriptors / Indicators: Aching;Operative site guarding Pain Intervention(s): Limited activity within patient's tolerance;Monitored during session;Repositioned    Home Living Family/patient expects to be discharged to:: Private residence Living Arrangements: Spouse/significant other Available Help at Discharge: Family;Available 24 hours/day Type of Home: House Home Access: Stairs to enter Entrance Stairs-Rails: None Entrance Stairs-Number of Steps: 3 Home Layout: Two level;Able to live on main level with bedroom/bathroom Home Equipment: Kasandra Knudsen - single point;Shower seat - built in      Prior Function Level of Independence: Independent  Comments: Works as PT at [employer]: Right    Extremity/Trunk Assessment   Upper Extremity Assessment Upper Extremity Assessment: Defer to OT evaluation    Lower Extremity Assessment Lower Extremity Assessment: RLE deficits/detail RLE Deficits / Details: N[place]bess at knee. Deificits consistent with post op pain and weakness.     Cervical / Trunk Assessment Cervical / Trunk Assessment: Normal  Communication   Communication: No difficulties  Cognition Arousal/Alertness: Awake/alert Behavior During Therapy: WFL for tasks assessed/performed Overall Cognitive Status: Within Functional Limits for tasks assessed                                        General Comments General comments (skin integrity, edema, etc.): Pt's husband present during session.     Exercises     Assessment/Plan    PT Assessment Patient needs continued PT services  PT Problem List Decreased strength;Decreased range of motion;Pain;Decreased mobility       PT Treatment Interventions DME instruction;Gait training;Stair training;Functional mobility training;Therapeutic activities;Therapeutic exercise;Neuromuscular re-education;Balance training;Patient/family education    PT Goals (Current goals can be found in the Care Plan section)  Acute Rehab PT Goals Patient Stated Goal: to go home tonight  PT Goal Formulation: With patient Time For Goal Achievement: 09/22/16 Potential to Achieve Goals: Good    Frequency 7X/week   Barriers to discharge        Co-evaluation               AM-PAC PT "6 Clicks" Daily Activity  Outcome Measure Difficulty turning over in bed (including adjusting bedclothes, sheets and blankets)?: A Little Difficulty moving from lying on back to sitting on the side of the bed? : A Little Difficulty sitting down on and standing up from a chair with arms (e.g., wheelchair, bedside commode, etc,.)?: Unable Help needed moving to and from a bed to chair (including  a wheelchair)?: A Little Help needed walking in hospital room?: A Little Help needed climbing 3-5 steps with a railing? : A Little 6 Click Score: 16    End of Session Equipment Utilized During Treatment: Gait belt;Right knee immobilizer Activity Tolerance: Patient tolerated treatment well Patient left: in chair;with call bell/phone within reach;with family/visitor present Nurse Communication: Mobility status (need for RW) PT Visit Diagnosis: Other abnormalities of gait and mobility (R26.89);Pain Pain - Right/Left: Right Pain - part of body: Knee    Time: 1710-1735 PT Time Calculation (min) (ACUTE ONLY): 25 min   Charges:   PT Evaluation $PT Eval Low Complexity: 1 Low PT Treatments $Gait Training: 8-22 mins   PT G Codes:   PT G-Codes **NOT FOR INPATIENT CLASS** Functional Assessment Tool Used: AM-PAC 6 Clicks Basic Mobility Functional Limitation: Mobility: Walking and moving around Mobility: Walking and Moving Around Current Status (Z6109): At least 40 percent but less than 60 percent impaired, limited or restricted Mobility: Walking and Moving Around Goal Status [phone]): At least 1 percent but less than 20 percent impaired, limited or restricted    Leighton Ruff, PT, DPT  Acute Rehabilitation Services  Pager: [place] 09/15/2016, 6:18 PM

## 2016-09-15 NOTE — Anesthesia Procedure Notes (Signed)
Spinal  Patient location during procedure: OR Start time: 09/15/2016 7:34 AM End time: 09/15/2016 7:40 AM Staffing Anesthesiologist: Oleta Mouse Preanesthetic Checklist Completed: patient identified, surgical consent, pre-op evaluation, timeout performed, IV checked, risks and benefits discussed and monitors and equipment checked Spinal Block Patient position: sitting Prep: ChloraPrep and site prepped and draped Patient monitoring: heart rate, cardiac monitor, continuous pulse ox and blood pressure Approach: midline Location: L3-4 Injection technique: single-shot Needle Needle type: Pencan  Needle gauge: 24 G Needle length: 10 cm Assessment Sensory level: T6

## 2016-09-15 NOTE — Anesthesia Procedure Notes (Addendum)
Anesthesia Regional Block: Adductor canal block   Pre-Anesthetic Checklist: ,, timeout performed, Correct Patient, Correct Site, Correct Laterality, Correct Procedure, Correct Position, site marked, Risks and benefits discussed,  Surgical consent,  Pre-op evaluation,  At surgeon's request and post-op pain management  Laterality: Lower and Right  Prep: chloraprep       Needles:  Injection technique: Single-shot  Needle Type: Echogenic Stimulator Needle          Additional Needles:   Procedures: ultrasound guided,,,,,,,,  Narrative:  Start time: 09/15/2016 7:16 AM End time: 09/15/2016 7:21 AM Injection made incrementally with aspirations every 5 mL.  Performed by: Personally  Anesthesiologist: Wylma Tatem  Additional Notes: H+P and labs reviewed, risks and benefits discussed with patient, procedure tolerated well without complications

## 2016-09-15 NOTE — Progress Notes (Signed)
Pt OOB in chair-tol very well. Pt wants to DC to home since she is still waiting on a bed.I called Dr Mardelle Matte per pt request & left message.

## 2016-09-15 NOTE — Progress Notes (Signed)
Pt ready for discharge. IV removed. Pt. Is alert and oriented. Pt is hemodynamically stable. AVS reviewed with pt. Capable of re verbalizing medication regimen. Discharge plan appropriate and in place. 

## 2016-09-15 NOTE — Transfer of Care (Signed)
Immediate Anesthesia Transfer of Care Note  Patient: Diana [name]  Procedure(s) Performed: Procedure(s): UNICOMPARTMENTAL KNEE (Right)  Patient Location: PACU  Anesthesia Type:Spinal  Level of Consciousness: awake  Airway & Oxygen Therapy: Patient Spontanous Breathing and Patient connected to nasal cannula oxygen  Post-op Assessment: Report given to RN and Post -op Vital signs reviewed and stable  Post vital signs: Reviewed and stable  Last Vitals:  Vitals:   09/15/16 0624  BP: 130/89  Pulse: 78  Resp: 18  Temp: 36.5 C  SpO2: 97%    Last Pain:  Vitals:   09/15/16 0624  TempSrc: Oral  PainSc:          Complications: No apparent anesthesia complications

## 2016-09-15 NOTE — Discharge Instructions (Signed)

## 2016-09-15 NOTE — Anesthesia Preprocedure Evaluation (Signed)
Anesthesia Evaluation  Patient identified by MRN, date of birth, ID band Patient awake    Reviewed: Allergy & Precautions, NPO status , Patient's Chart, lab work & pertinent test results  History of Anesthesia Complications (+) PONV and history of anesthetic complications  Airway Mallampati: III  TM Distance: >3 FB Neck ROM: Full    Dental  (+) Teeth Intact   Pulmonary sleep apnea ,    breath sounds clear to auscultation       Cardiovascular negative cardio ROS   Rhythm:Regular     Neuro/Psych  Headaches, PSYCHIATRIC DISORDERS Anxiety Depression    GI/Hepatic negative GI ROS, Neg liver ROS,   Endo/Other  Hypothyroidism Morbid obesity  Renal/GU negative Renal ROS     Musculoskeletal   Abdominal   Peds  Hematology negative hematology ROS (+)   Anesthesia Other Findings   Reproductive/Obstetrics                             Anesthesia Physical Anesthesia Plan  ASA: III  Anesthesia Plan: MAC, Spinal and Regional   Post-op Pain Management:    Induction:   PONV Risk Score and Plan: 3 and Ondansetron, Dexamethasone, Midazolam and Propofol infusion  Airway Management Planned: Nasal Cannula  Additional Equipment: None  Intra-op Plan:   Post-operative Plan:   Informed Consent: I have reviewed the patients History and Physical, chart, labs and discussed the procedure including the risks, benefits and alternatives for the proposed anesthesia with the patient or authorized representative who has indicated his/her understanding and acceptance.   Dental advisory given  Plan Discussed with: CRNA and Surgeon  Anesthesia Plan Comments:         Anesthesia Quick Evaluation

## 2016-09-16 ENCOUNTER — Encounter (HOSPITAL_COMMUNITY): Payer: Self-pay | Admitting: Orthopedic Surgery

## 2016-09-16 NOTE — Anesthesia Postprocedure Evaluation (Signed)
Anesthesia Post Note  Patient: MIRTA MALLY  Procedure(s) Performed: Procedure(s) (LRB): UNICOMPARTMENTAL KNEE (Right)     Patient location during evaluation: PACU Anesthesia Type: Regional, Spinal and MAC Level of consciousness: awake and alert Pain management: pain level controlled Vital Signs Assessment: post-procedure vital signs reviewed and stable Respiratory status: spontaneous breathing, nonlabored ventilation, respiratory function stable and patient connected to nasal cannula oxygen Cardiovascular status: stable and blood pressure returned to baseline Postop Assessment: no signs of nausea or vomiting and spinal receding Anesthetic complications: no    Last Vitals:  Vitals:   09/15/16 1600 09/15/16 1635  BP:  (!) 147/55  Pulse:  82  Resp:  18  Temp: (!) 36.1 C 36.7 C  SpO2: 99% 96%    Last Pain:  Vitals:   09/15/16 1635  TempSrc: Oral  PainSc:                  Filbert Craze

## 2016-09-16 NOTE — Discharge Summary (Signed)
Physician Discharge Summary  Patient ID: Diana [name] MRN: [id number] DOB/AGE: 01/27/1955 61 y.o.  Admit date: 09/15/2016 Discharge date: 09/15/2016  Admission Diagnoses:  Right knee primary localized osteoarthritis  Discharge Diagnoses:  Active Problems:   S/P knee replacement   Past Medical History:  Diagnosis Date  . Anxiety    controlled  . Atrial septal defect    Small,non significant atrial septal defect with QP/QS of 1.25 by MRA.  . Cancer ([facility])    melanoma left leg 2012  . Complication of anesthesia    gets migraine when she has to be NPO  . Depression   . Migraine    occasionally  . Migraine headache   . Obesity   . Obesity   . Obstructive apnea    CPAP user since 2012.   . OSA on CPAP 09/01/2013  . PONV (postoperative nausea and vomiting)    n/v from migraine, not anesthesia  . Pseudotumor cerebri Diagnosed in March 2008-   She sees Dr Clovis Riley in this regard  . Sleep apnea   . Statin intolerance    with elevated CRP    Surgeries: Procedure(s): UNICOMPARTMENTAL KNEE on 09/15/2016   Consultants (if any):   Discharged Condition: Improved  Hospital Course: Diana STUEVE is an 61 y.o. female who was admitted 09/15/2016 with a diagnosis of right knee osteoarthritis and went to the operating room on 09/15/2016 and underwent the above named procedures.    She was given perioperative antibiotics:  Anti-infectives    Start     Dose/Rate Route Frequency Ordered Stop   09/15/16 2000  vancomycin (VANCOCIN) IVPB 1000 mg/200 mL premix  Status:  Discontinued     1,000 mg 200 mL/hr over 60 Minutes Intravenous Every 12 hours 09/15/16 1616 09/15/16 2150   09/15/16 0700  ceFAZolin (ANCEF) 3 g in dextrose 5 % 50 mL IVPB  Status:  Discontinued     3 g 130 mL/hr over 30 Minutes Intravenous To [facility]Stay [facility] 09/14/16 1145 09/15/16 1615   09/15/16 0615  vancomycin (VANCOCIN) IVPB 1000 mg/200 mL premix     1,000 mg 200 mL/hr over 60 Minutes Intravenous On call to  O.R. 09/15/16 0606 09/15/16 0720   09/15/16 6295  vancomycin (VANCOCIN) 1-5 GM/200ML-% IVPB    Comments:  Scronce, Trina   : cabinet override      09/15/16 0611 09/15/16 0720    .  She was given sequential compression devices, early ambulation, and xarelto for DVT prophylaxis.  She benefited maximally from the hospital stay and there were no complications.    Recent vital signs:  Vitals:   09/15/16 1600 09/15/16 1635  BP:  (!) 147/55  Pulse:  82  Resp:  18  Temp: (!) 97 F (36.1 C) 98.1 F (36.7 C)  SpO2: 99% 96%    Recent laboratory studies:  Lab Results  Component Value Date   HGB 13.4 09/03/2016   HGB 13.3 02/13/2016   HGB 13.5 02/11/2015   Lab Results  Component Value Date   WBC 7.0 09/03/2016   PLT 215 09/03/2016   No results found for: INR Lab Results  Component Value Date   NA 139 09/03/2016   K 4.1 09/03/2016   CL 106 09/03/2016   CO2 26 09/03/2016   BUN 17 09/03/2016   CREATININE 0.94 09/03/2016   GLUCOSE 105 (H) 09/03/2016    Discharge Medications:   Allergies as of 09/15/2016      Reactions   Augmentin [amoxicillin-pot Clavulanate] Nausea And  Vomiting   Has patient had a PCN reaction causing immediate rash, facial/tongue/throat swelling, SOB or lightheadedness with hypotension: No Has patient had a PCN reaction causing severe rash involving mucus membranes or skin necrosis: No Has patient had a PCN reaction that required hospitalization: No Has patient had a PCN reaction occurring within the last 10 years: #  #  #  YES  #  #  #  > 2014 If all of the above answers are "NO", then may proceed with Cephalosporin use.      Medication List    TAKE these medications   ALPRAZolam 0.5 MG tablet Commonly known as:  XANAX Take 0.5 mg by mouth daily as needed for anxiety.   aspirin EC 81 MG tablet Take 1 tablet (81 mg total) by mouth daily.   baclofen 10 MG tablet Commonly known as:  LIORESAL Take 1 tablet (10 mg total) by mouth 3 (three) times  daily. As needed for muscle spasm   cetirizine 10 MG tablet Commonly known as:  ZYRTEC Take 10 mg by mouth daily.   Co-Enzyme Q10 200 MG Caps Take 200 mg by mouth daily.   estradiol 0.075 MG/24HR Commonly known as:  VIVELLE-DOT Place 1 patch onto the skin 2 (two) times a week. Rotating sites   levothyroxine 50 MCG tablet Commonly known as:  SYNTHROID, LEVOTHROID Take 1 tablet (50 mcg total) by mouth daily.   MULTI-VITAMIN DAILY Tabs Take by mouth See admin instructions. doterra multi - takes 2 tablets of 3 different multis for a total of 6 tabs   NONFORMULARY OR COMPOUNDED ITEM Place 1 application onto the skin at bedtime. Testosterone cream   ondansetron 4 MG tablet Commonly known as:  ZOFRAN Take 1 tablet (4 mg total) by mouth every 8 (eight) hours as needed for nausea or vomiting.   OVER THE COUNTER MEDICATION Take 1 tablet by mouth 3 (three) times daily as needed (to prevent sickness). onguard   oxyCODONE 5 MG immediate release tablet Commonly known as:  ROXICODONE Take 1-2 tablets (5-10 mg total) by mouth every 4 (four) hours as needed for severe pain.   pravastatin 40 MG tablet Commonly known as:  PRAVACHOL Take 1 tablet (40 mg total) by mouth every evening.   PRISTIQ 100 MG 24 hr tablet Generic drug:  desvenlafaxine Take 100 mg by mouth at bedtime.   progesterone 100 MG capsule Commonly known as:  PROMETRIUM Take 200 mg by mouth at bedtime.   rivaroxaban 10 MG Tabs tablet Commonly known as:  XARELTO Take 1 tablet (10 mg total) by mouth daily.   sennosides-docusate sodium 8.6-50 MG tablet Commonly known as:  SENOKOT-S Take 2 tablets by mouth daily.   SUMAtriptan 100 MG tablet Commonly known as:  IMITREX TAKE 1 TABLET (100 MG TOTAL) BY MOUTH ONCE. MAY REPEAT IN 2 HOURS IF HEADACHE PERSISTS OR RECURS.   thyroid 65 MG tablet Commonly known as:  ARMOUR Take 65 mg by mouth daily.   traMADol 50 MG tablet Commonly known as:  ULTRAM Take 1 tablet (50 mg  total) by mouth every 6 (six) hours as needed.            Discharge Care Instructions        Start     Ordered   09/15/16 0000  ondansetron (ZOFRAN) 4 MG tablet  Every 8 hours PRN     09/15/16 1021   09/15/16 0000  baclofen (LIORESAL) 10 MG tablet  3 times daily  09/15/16 1021   09/15/16 0000  sennosides-docusate sodium (SENOKOT-S) 8.6-50 MG tablet  Daily     09/15/16 1021   09/15/16 0000  rivaroxaban (XARELTO) 10 MG TABS tablet  Daily     09/15/16 1021   09/15/16 0000  Weight bearing as tolerated     09/15/16 1021   09/15/16 0000  oxyCODONE (ROXICODONE) 5 MG immediate release tablet  Every 4 hours PRN     09/15/16 1021   09/15/16 0000  traMADol (ULTRAM) 50 MG tablet  Every 6 hours PRN     09/15/16 1021      Diagnostic Studies: Dg Knee Right Port  Result Date: 09/15/2016 CLINICAL DATA:  Right knee replacement. EXAM: PORTABLE RIGHT KNEE - 1-2 VIEW COMPARISON:  No recent prior. FINDINGS: Right hemiarthroplasty. Hardware intact. Anatomic alignment. No acute bony abnormality . Tiny rounded density noted over the suprapatellar space may represent a tiny loose body or foreign body . Postsurgical changes in the soft tissues. IMPRESSION: Right hemiarthroplasty.  Anatomic alignment. Electronically Signed   By: Marcello Moores  Register   On: 09/15/2016 11:06    Disposition: 01-Home or Self Care  Discharge Instructions    Weight bearing as tolerated    Complete by:  As directed       Follow-up Information    Marchia Bond, MD. Schedule an appointment as soon as possible for a visit in 2 weeks.   Specialty:  Orthopedic Surgery Contact information: [place] [address] [phone]            Signed: Johnny Bridge 09/16/2016, 7:50 AM

## 2016-11-04 ENCOUNTER — Other Ambulatory Visit: Payer: Self-pay | Admitting: Internal Medicine

## 2016-11-04 DIAGNOSIS — Z1231 Encounter for screening mammogram for malignant neoplasm of breast: Secondary | ICD-10-CM

## 2016-11-25 ENCOUNTER — Ambulatory Visit
Admission: RE | Admit: 2016-11-25 | Discharge: 2016-11-25 | Disposition: A | Discharge status: Home / Self Care | Payer: PRIVATE HEALTH INSURANCE | Source: Ambulatory Visit | Attending: Internal Medicine | Admitting: Internal Medicine

## 2016-11-25 DIAGNOSIS — Z1231 Encounter for screening mammogram for malignant neoplasm of breast: Secondary | ICD-10-CM

## 2016-12-29 ENCOUNTER — Other Ambulatory Visit: Payer: Self-pay | Admitting: Internal Medicine

## 2017-01-14 ENCOUNTER — Encounter: Payer: Self-pay | Admitting: Internal Medicine

## 2017-01-14 ENCOUNTER — Ambulatory Visit (INDEPENDENT_AMBULATORY_CARE_PROVIDER_SITE_OTHER): Payer: PRIVATE HEALTH INSURANCE | Admitting: Internal Medicine

## 2017-01-14 VITALS — Ht 68.5 in

## 2017-01-14 DIAGNOSIS — J22 Unspecified acute lower respiratory infection: Secondary | ICD-10-CM | POA: Diagnosis not present

## 2017-01-14 MED ORDER — CLARITHROMYCIN 500 MG PO TABS: 500.0000 mg | ORAL_TABLET | Freq: Two times a day (BID) | ORAL | 0 refills | Status: DC

## 2017-01-14 MED ORDER — PREDNISONE 10 MG PO TABS: ORAL_TABLET | ORAL | 0 refills | Status: DC

## 2017-01-14 MED ORDER — HYDROCODONE-HOMATROPINE 5-1.5 MG/5ML PO SYRP: 5.0000 mL | ORAL_SOLUTION | Freq: Three times a day (TID) | ORAL | 0 refills | Status: DC | PRN

## 2017-01-14 NOTE — Progress Notes (Signed)
   Subjective:    Patient ID: [name], female    DOB: 1955-07-18, 61 y.o.   MRN: [id number]  HPI  61 year old Female here with a cough that she is developed just recently.  Tends to get bronchitis.  Is recovering well from a right unicompartmental knee replacement in August.  Health maintenance exam is due in February.  Had flu vaccine in October.  No cough or shaking chills.    Review of Systems see above     Objective:   Physical Exam Skin warm and dry.  Nodes none.  Pharynx is clear.  TMs clear with a little bit of serous fluid.  Neck is supple without adenopathy.  Chest clear to auscultation without rales or wheezing.       Assessment & Plan:  Acute bronchitis  History of bronchospasm  Plan: Prednisone to take a tapering course going from 60 mg to 0 mg over 7 days.  Biaxin 500 mg twice daily for 10 days.  Rest and drink plenty of fluids.  Hycodan 1 teaspoon p.o. every 8 hours as needed cough.

## 2017-01-18 NOTE — Patient Instructions (Signed)
Take prednisone and tapering course.  Take Hycodan as needed for cough.  Biaxin 500 mg twice daily for 10 days.  Rest and drink plenty of fluids.

## 2017-02-05 ENCOUNTER — Other Ambulatory Visit: Payer: Self-pay | Admitting: Internal Medicine

## 2017-02-05 DIAGNOSIS — E039 Hypothyroidism, unspecified: Secondary | ICD-10-CM

## 2017-02-05 DIAGNOSIS — Z0000 Encounter for general adult medical examination without abnormal findings: Secondary | ICD-10-CM

## 2017-02-05 DIAGNOSIS — I10 Essential (primary) hypertension: Secondary | ICD-10-CM

## 2017-02-05 DIAGNOSIS — E785 Hyperlipidemia, unspecified: Secondary | ICD-10-CM

## 2017-02-05 DIAGNOSIS — E559 Vitamin D deficiency, unspecified: Secondary | ICD-10-CM

## 2017-02-26 ENCOUNTER — Other Ambulatory Visit: Payer: Self-pay | Admitting: Internal Medicine

## 2017-03-11 ENCOUNTER — Other Ambulatory Visit: Payer: PRIVATE HEALTH INSURANCE | Admitting: Internal Medicine

## 2017-03-11 DIAGNOSIS — E785 Hyperlipidemia, unspecified: Secondary | ICD-10-CM

## 2017-03-11 DIAGNOSIS — E559 Vitamin D deficiency, unspecified: Secondary | ICD-10-CM

## 2017-03-11 DIAGNOSIS — E7439 Other disorders of intestinal carbohydrate absorption: Secondary | ICD-10-CM

## 2017-03-11 DIAGNOSIS — I10 Essential (primary) hypertension: Secondary | ICD-10-CM

## 2017-03-11 DIAGNOSIS — E039 Hypothyroidism, unspecified: Secondary | ICD-10-CM

## 2017-03-11 DIAGNOSIS — Z0000 Encounter for general adult medical examination without abnormal findings: Secondary | ICD-10-CM

## 2017-03-11 NOTE — Addendum Note (Signed)
Addended by: Mady Haagensen on: 03/11/2017 11:02 AM   Modules accepted: Orders

## 2017-03-12 LAB — CBC WITH DIFFERENTIAL/PLATELET
BASOS PCT: 1.1 %
Basophils Absolute: 52 cells/uL (ref 0–200)
EOS ABS: 301 {cells}/uL (ref 15–500)
Eosinophils Relative: 6.4 %
HEMATOCRIT: 40.3 % (ref 35.0–45.0)
Hemoglobin: 13.6 g/dL (ref 11.7–15.5)
LYMPHS ABS: 1913 {cells}/uL (ref 850–3900)
MCH: 28.6 pg (ref 27.0–33.0)
MCHC: 33.7 g/dL (ref 32.0–36.0)
MCV: 84.7 fL (ref 80.0–100.0)
MPV: 11.6 fL (ref 7.5–12.5)
Monocytes Relative: 7.5 %
Neutro Abs: 2082 cells/uL (ref 1500–7800)
Neutrophils Relative %: 44.3 %
Platelets: 205 10*3/uL (ref 140–400)
RBC: 4.76 10*6/uL (ref 3.80–5.10)
RDW: 13.1 % (ref 11.0–15.0)
Total Lymphocyte: 40.7 %
WBC: 4.7 10*3/uL (ref 3.8–10.8)
WBCMIX: 353 {cells}/uL (ref 200–950)

## 2017-03-12 LAB — LIPID PANEL
Cholesterol: 209 mg/dL — ABNORMAL HIGH (ref <200)
HDL: 38 mg/dL — ABNORMAL LOW (ref >50)
LDL Cholesterol (Calc): 141 mg/dL (calc) — ABNORMAL HIGH
NON-HDL CHOLESTEROL (CALC): 171 mg/dL — AB (ref <130)
Total CHOL/HDL Ratio: 5.5 (calc) — ABNORMAL HIGH (ref <5.0)
Triglycerides: 165 mg/dL — ABNORMAL HIGH (ref <150)

## 2017-03-12 LAB — COMPLETE METABOLIC PANEL WITH GFR
AG Ratio: 1.6 (calc) (ref 1.0–2.5)
ALBUMIN MSPROF: 4.2 g/dL (ref 3.6–5.1)
ALKALINE PHOSPHATASE (APISO): 72 U/L (ref 33–130)
ALT: 25 U/L (ref 6–29)
AST: 24 U/L (ref 10–35)
BUN: 17 mg/dL (ref 7–25)
CALCIUM: 9.2 mg/dL (ref 8.6–10.4)
CO2: 26 mmol/L (ref 20–32)
CREATININE: 0.85 mg/dL (ref 0.50–0.99)
Chloride: 106 mmol/L (ref 98–110)
GFR, EST NON AFRICAN AMERICAN: 73 mL/min/{1.73_m2} (ref >=60)
GFR, Est African American: 85 mL/min/{1.73_m2} (ref >=60)
GLUCOSE: 103 mg/dL — AB (ref 65–99)
Globulin: 2.6 g/dL (calc) (ref 1.9–3.7)
Potassium: 4.1 mmol/L (ref 3.5–5.3)
Sodium: 140 mmol/L (ref 135–146)
Total Bilirubin: 0.6 mg/dL (ref 0.2–1.2)
Total Protein: 6.8 g/dL (ref 6.1–8.1)

## 2017-03-12 LAB — HEMOGLOBIN A1C
EAG (MMOL/L): 6.2 (calc)
Hgb A1c MFr Bld: 5.5 % of total Hgb (ref <5.7)
Mean Plasma Glucose: 111 (calc)

## 2017-03-12 LAB — VITAMIN D 25 HYDROXY (VIT D DEFICIENCY, FRACTURES): Vit D, 25-Hydroxy: 35 ng/mL (ref 30–100)

## 2017-03-12 LAB — TSH: TSH: 1.65 mIU/L (ref 0.40–4.50)

## 2017-03-18 ENCOUNTER — Encounter: Payer: Self-pay | Admitting: Internal Medicine

## 2017-03-18 ENCOUNTER — Ambulatory Visit (INDEPENDENT_AMBULATORY_CARE_PROVIDER_SITE_OTHER): Payer: PRIVATE HEALTH INSURANCE | Admitting: Internal Medicine

## 2017-03-18 VITALS — BP 110/80 | HR 88 | Ht 68.0 in | Wt 266.0 lb

## 2017-03-18 DIAGNOSIS — Z0000 Encounter for general adult medical examination without abnormal findings: Secondary | ICD-10-CM | POA: Diagnosis not present

## 2017-03-18 DIAGNOSIS — E039 Hypothyroidism, unspecified: Secondary | ICD-10-CM

## 2017-03-18 DIAGNOSIS — Z8669 Personal history of other diseases of the nervous system and sense organs: Secondary | ICD-10-CM

## 2017-03-18 DIAGNOSIS — Z6841 Body Mass Index (BMI) 40.0 and over, adult: Secondary | ICD-10-CM

## 2017-03-18 DIAGNOSIS — E782 Mixed hyperlipidemia: Secondary | ICD-10-CM | POA: Diagnosis not present

## 2017-03-18 DIAGNOSIS — Z8659 Personal history of other mental and behavioral disorders: Secondary | ICD-10-CM

## 2017-03-18 DIAGNOSIS — Q211 Atrial septal defect, unspecified: Secondary | ICD-10-CM

## 2017-03-18 DIAGNOSIS — Z85820 Personal history of malignant melanoma of skin: Secondary | ICD-10-CM

## 2017-03-18 NOTE — Progress Notes (Signed)
Subjective:    Patient ID: Diana [name], female    DOB: Nov 21, 1955, 62 y.o.   MRN: [id number]  HPI 62 year old Female in today for health maintenance exam and evaluation of medical issues.  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  She had right knee arthroplasty by Dr. Mardelle Matte in August and is done well recovering.  Seen in December for respiratory infection.  She has lost weight.  In March 2018 she weighed 302 pounds.  Now weighs 266 pounds.  She was congratulated on weight loss of 36 pounds. History of ASD followed by Dr. Burt Knack.  Had 2D echocardiogram in May 2018 showing small secundum ASD no significant shunt.  Continue aspirin.  Continue imaging every 2-3 years.  History of sleep apnea.  History of impaired glucose tolerance but now has normal hemoglobin A1c.  History of vitamin D deficiency last year but now level is normal.  History of hyperlipidemia but this has improved with diet.  A year ago total cholesterol was 239 and is now 39.  Triglycerides are 174 and are now 165.  LDL was 164 and is now 141.  She is on pravastatin because she was intolerant of Crestor.  In 2012 was diagnosed with malignant melanoma left thigh treated at [facility] and had lymph node sampling.  She is followed by Dr. Ubaldo Glassing.  Lesion was Clark level IV.  History of basal cell carcinoma upper back in 2012.  History of possible pseudotumor cerebri in 2008.  She saw Dr. 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  She had upper endoscopy by Dr. Deatra Ina which was normal.  Social history: Married.  No children.  Non-smoker.  Rarely consumes alcohol maybe once weekly.  Family history: Mother with history of migraine headaches.  Father died at age 27 with history of stroke and  congestive heart failure.  One brother in good health.  Mother died at age 24 with colonic infarction but had a history of Alzheimer's disease.  History of migraine headaches related to weather changes.  Takes Imitrex.  No known drug allergies.  History of depression treated with Pristiq.  Social history married.  No children.  Does not smoke.  Social alcohol consumption. Review of Systems     Objective:   Physical Exam  Constitutional: She is oriented to person, place, and time. She appears well-developed and well-nourished.  HENT:  Head: Normocephalic and atraumatic.  Neck: No JVD present. No thyromegaly present.  Cardiovascular: Normal rate, regular rhythm, normal heart sounds and intact distal pulses.  No murmur heard. Pulmonary/Chest: Effort normal. No respiratory distress. She has no wheezes. She has no rales.  Breasts deferred to GYN  Abdominal: Soft. Bowel sounds are normal. She exhibits no distension and no mass. There is no tenderness. There is no rebound and no guarding.  Genitourinary:  Genitourinary Comments: Deferred to GYN  Musculoskeletal: She exhibits no edema.  Neurological: She is alert and oriented to person, place, and time. She has normal reflexes. No cranial nerve deficit. Coordination normal.  Skin: Skin is warm and dry.  Psychiatric: She has a normal mood and affect. Her behavior is normal. Judgment and thought content normal.  Vitals reviewed.         Assessment & Plan:  BMI is 40.45.  I am very pleased with her weight  loss.  Continue diet and exercise efforts and follow-up in 6 months or as needed.  Hyperlipidemia-continue statin therapy per Dr. Burt Knack  History of ASD  History of depression-stable on SSRI  History of headaches  Hypothyroidism treated with low-dose Synthroid since January 2017  Plan: Follow-up in 6 months with lipid panel liver functions and TSH as well as weight check.

## 2017-03-18 NOTE — Patient Instructions (Signed)
It was a pleasure to see you today.  Congratulations on weight loss.  Continue diet and exercise and follow-up in 6 months

## 2017-04-06 ENCOUNTER — Encounter: Payer: Self-pay | Admitting: Neurology

## 2017-04-08 ENCOUNTER — Encounter: Payer: Self-pay | Admitting: Neurology

## 2017-04-08 ENCOUNTER — Ambulatory Visit: Payer: PRIVATE HEALTH INSURANCE | Admitting: Neurology

## 2017-04-08 VITALS — BP 134/88 | HR 76 | Ht 69.0 in | Wt 238.0 lb

## 2017-04-08 DIAGNOSIS — G4733 Obstructive sleep apnea (adult) (pediatric): Secondary | ICD-10-CM | POA: Diagnosis not present

## 2017-04-08 DIAGNOSIS — Z9989 Dependence on other enabling machines and devices: Secondary | ICD-10-CM | POA: Diagnosis not present

## 2017-04-08 DIAGNOSIS — R634 Abnormal weight loss: Secondary | ICD-10-CM | POA: Diagnosis not present

## 2017-04-08 NOTE — Progress Notes (Signed)
SLEEP MEDICINE CLINIC   Provider:  Larey Seat, [place] D  Primary Care Physician:  Elby Showers, MD   Referring Provider: Elby Showers, MD    Chief Complaint  Patient presents with  . Follow-up    pt alone, rm 11. CPAP is working well. [facility]    HPI:  CANDID BOVEY is a 62 y.o. female , seen here as in a referral/ revisit  from Dr. Renold Genta for    Chief Complaint  Patient presents with  . Follow-up    pt alone, rm 11. CPAP is working well. D[facility]   HPI: JAEDEN WESTBAY is a 62  y.o. female seen here at [facility] as a referral from Dr. Caprice Beaver. She used to follow Dr. Erling Cruz, who signed her CPAP orders, but she was never scheduled for a formal sleep evaluation.  Mrs. Rzasa underwent a split study on 9-28 2012. She has seen me yearly since then and has an AutoSet CPAP machine. Her sleep habits have not changed. She still not taking naps, she is not a shift worker, she usually goes to bed between 10 and 11 PM and rises at 6.00 in the morning, Most of the nights she will need an alarm to wake up in the morning. She will be received fresh and restored after a nocturnal sleep of about 7-7-1/2 hours.    07-29-12 for a follow up  visit : The patient used the auto titrator between . Average daily usage was 7 hours and 42 minutes, which is a very good result. Residual apnea was 0.9 or. She had very minor air leaks and her 95th percentile pressure on CPAP was 10 cm the window or for this Ultracet was between 7 and 15 cm ater she endorsed the Epworth sleepiness score it only took points today. I would like for her to have the machine set at 10 cm water with a peak TR function. The patient has always been compliant with the CPAP but this is in [place] port at the CPAP actually has reduced her apnea to a single-digit orbital single-digit level. She's 100% compliant. She had been placed on an auto-titrator before the SPLIT study and slept well with it. She tried changing to a different sleep  setting of 9 cm water, and resumed afterwards her old autoset CPAP machine.  The patient never had a tonsillectomy. During her youth on [place] used a Health and safety inspector for retrognathia. She has no TMJ. She has a deformed anterior chest wall, Her estimated nocturnal sleep time about 7 hours.  She  does not complain of nocturia, as no breakthrough snoring or witnessed apneas.  The patient's on the cardiology history is a patent foramen ovale, she has or a body weight of 239 pounds at 5 feet 8 inches. Since the last sleep study she first gained over 60 pounds and lost already 25. College years the patient has never worked shift work again., She does not take daytime naps. The patient is using her 62 year old autotitrator machine. She received a new one after her sleep study end of 2012 but was unable to tolerate it.  Fam. history: Her father has a history of excessive daytime sleepiness loud snoring and she witnessed several times apneas , also he was never formally diagnosed. He lived to be 95.   09-11-2015 Mrs. Bill Salinas presents today brokenhearted as her CPAP machine has a tight. He has used a AutoSet advantage the S8 machine for over 9 years.  She has been a very compliant CPAP user with 100% compliance up to early August 2017 average user time of 7 hours 11 minutes, AutoSet is allowing pressures between 10 and 15 cm water was 1 cm EPR and the residual AHI was 1.2. Air leaks were very mild 91st percentile pressure was 12 cm water. I will sign a prescription and [facility] to [facility] new CPAP machine I would like her to have an air sense machine with an O2 sat between 8 and 15 cm water and 1 cm EPR. I will ask Graciella Belton to set the machine for her. She endorsed today the Epworth sleepiness score at 2 points, she does not endorse a high fatigue, she is not clinically depressed. She just would like to start CPAP.   Interval history on 04/02/2016, Mrs. Bill Salinas is here for her regular CPAP  compliance visits and she has done excellent. 100% compliant 7 hours and 3 minutes average user time for the last 30 days, she's using an AutoSet between 8 and 15 cm water, and the 95th percentile pressure is 13.1 cm at night. The residual apneas is 0.6 per hour. Her husband believes that he heard some breakthrough snoring at night. 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   Interval history from 08 April 2017, I have the pleasure of meeting today with shasha buchbinder, a 62 year old Caucasian CPAP user ,with a 93% compliance for the last 30 days.  Average use of time 6 hours and 55 minutes on CPAP nightly, the machine is an AutoSet between 8 and 15 cmH2O pressure was 1 cm expiratory pressure relief.  Residual AHI is 0.7, 95th percentile pressure is 10.8, there are no central apneas emerging, there is no high air leak.  The patient has benefited from CPAP therapy as her Epworth sleepiness score is endorsed at only 2 points and her fatigue severity at 10 points. She has noted how bad she felt when the power failed and she was without CPAP. She has a second, older CPAP machine at her second residence -     ROS : The patient endorsed the Epworth sleepiness score at 2 points, the fatigue severity scale at 17 points, and the geriatric depression score at 1 out of 15 points.  General: The patient is awake, alert and appears not in acute distress. The patient is well groomed. Neck circumference, 16.25  [place], 3, no Retrognathia. Head: Normocephalic, atraumatic. Neck is supple. Cardiovascular:  Regular rate and rhythm , without  murmurs or carotid bruit, and without distended neck veins. Respiratory: Lungs are clear to auscultation.Skin:  Without evidence of edema, or rashTrunk: BMI is elevated .  Neurologic exam :The patient is awake and alert, oriented to place and time.   Cranial nerves: No loss of taste or smell sensation. Vision stable. Pupils are equal and briskly reactive to light.  Extraocular movements  in vertical and horizontal planes intact . Visual fields by finger perimetry are intact. Hearing to finger rub intact.  Facial sensation intact to fine touch.  Facial motor strength is symmetric and tongue and uvula move midline. Motor: patient has knee arthritis , but good ROM . Status post partial knee replacement-  Right side.    Gait- intact.    Assessment; Mrs. Bill Salinas is doing excellently with CPAP use, she achieves at least 7 hours of nocturnal sleep, she usually sleeps uninterrupted, she feels refreshed and restored in the morning endorsed neither depression symptoms, no fatigue  and a resetting of her CPAP is not necessary. The patient can remain on AutoPap. I would like to see her yearly with her machine which can be WiFi loaded.     Social History   Socioeconomic History  . Marital status: Married    Spouse name: Jeneen Rinks  . Number of children: 0  . Years of education: masters  . Highest education level: Not on file  Occupational History    Employer: [employer]: [employer]  . Financial resource strain: Not on file  . Food insecurity:    Worry: Not on file    Inability: Not on file  . Transportation needs:    Medical: Not on file    Non-medical: Not on file  Tobacco Use  . Smoking status: Never Smoker  . Smokeless tobacco: Never Used  Substance and Sexual Activity  . Alcohol use: Yes    Comment: one glass of wine on weekends  . Drug use: No  . Sexual activity: Not on file  Lifestyle  . Physical activity:    Days per week: Not on file    Minutes per session: Not on file  . Stress: Not on file  Relationships  . Social connections:    Talks on phone: Not on file    Gets together: Not on file    Attends religious service: Not on file    Active member of club or organization: Not on file    Attends meetings of clubs or organizations: Not on file    Relationship status: Not on file  . Intimate  partner violence:    Fear of current or ex partner: Not on file    Emotionally abused: Not on file    Physically abused: Not on file    Forced sexual activity: Not on file  Other Topics Concern  . 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 She was referred for evaluation of what was previously been diagnosed as an ASD in 2001 at [facility]. She was advised to have it followed medically. She is marrried and lives with her spouse . She may have two glasses  of wine a week. She does not use tobacco. She sleeps with a CPAP.   Caffeine Use: 1-2 cups daily    Family History  Problem Relation Age of Onset  . Alzheimer's disease Mother   . Rheumatic fever Father     Past Medical History:  Diagnosis Date  . Anxiety    controlled  . Atrial septal defect    Small,non significant atrial septal defect with QP/QS of 1.25 by MRA.  . Cancer ([place])    melanoma left leg 2012  . Complication of anesthesia    gets migraine when she has to be NPO  . Depression   . Migraine    occasionally  . Migraine headache   . Obesity   . Obesity   . Obstructive apnea    CPAP user since 2012.   . OSA on CPAP 09/01/2013  . PONV (postoperative nausea and vomiting)    n/v from migraine, not anesthesia  . Pseudotumor cerebri Diagnosed in March 2008-   She sees Dr Clovis Riley in this regard  . Sleep apnea   . Statin intolerance    with elevated CRP    Past Surgical History:  Procedure Laterality Date  . BACK SURGERY    .  BUNIONECTOMY    . FRACTURE SURGERY     left 5th metatarsal  . ORTHOPEDIC SURGERY     lumbar surgery   . PARTIAL KNEE ARTHROPLASTY Right 09/15/2016   Procedure: UNICOMPARTMENTAL KNEE;  Surgeon: Marchia Bond, MD;  Location: [place];  Service: Orthopedics;  Laterality: Right;  . pin in the fifth metatarsal    . right knee arthroscopy      Current Outpatient Medications  Medication Sig Dispense Refill  . ALPRAZolam (XANAX)  0.5 MG tablet Take 0.5 mg by mouth daily as needed for anxiety.  0  . aspirin EC 81 MG tablet Take 1 tablet (81 mg total) by mouth daily.    . cetirizine (ZYRTEC) 10 MG tablet Take 10 mg by mouth daily.      Marland Kitchen Co-Enzyme Q10 200 MG CAPS Take 200 mg by mouth daily.    Marland Kitchen levothyroxine (SYNTHROID, LEVOTHROID) 50 MCG tablet TAKE 1 TABLET BY MOUTH DAILY. 90 tablet 1  . Multiple Vitamin (MULTI-VITAMIN DAILY) TABS Take by mouth See admin instructions. doterra multi - takes 2 tablets of 3 different multis for a total of 6 tabs    . OVER THE COUNTER MEDICATION Take 1 tablet by mouth 3 (three) times daily as needed (to prevent sickness). onguard    . SUMAtriptan (IMITREX) 100 MG tablet TAKE 1 TABLET BY MOUTH ONCE. MAY REPEAT IN 2 HOURS IF HEADACHE PERSISTS OR RECURS. MAX 2 TABLETS IN 24 HOURS 9 tablet 2   No current facility-administered medications for this visit.     Allergies as of 04/08/2017 - Review Complete 04/08/2017  Allergen Reaction Noted  . Augmentin [amoxicillin-pot clavulanate] Nausea And Vomiting 09/01/2016    Vitals: BP 134/88   Pulse 76   Ht 5\' 9"  (1.753 m)   Wt 238 lb (108 kg)   BMI 35.15 kg/m  Last Weight:  Wt Readings from Last 1 Encounters:  04/08/17 238 lb (108 kg)   VEH:MCNO mass index is 35.15 kg/m.     Last Height:   Ht Readings from Last 1 Encounters:  04/08/17 5\' 9"  (1.753 m)    Physical exam: General: The patient is awake, alert and appears not in acute distress. The patient is well groomed.   Assessment:  After physical and neurologic examination, review of laboratory studies,  Personal review of imaging studies, reports of other /same  Imaging studies, results of polysomnography and / or neurophysiology testing and pre-existing records as far as provided in visit., my assessment is    The patient was advised of the nature of the diagnosed disorder , the treatment options and the  risks for general health when OSA remains untreated.   OSA on CPAP, obesity -  lost 45 pounds since last visit  Will need auto PAP.    Larey Seat, MD 07/27/6281, 6:62 PM  Certified in Neurology by ABPN Certified in [place] by [facility] [address] [place], [place] [address]

## 2017-05-06 ENCOUNTER — Encounter: Payer: Self-pay | Admitting: Internal Medicine

## 2017-05-06 ENCOUNTER — Ambulatory Visit: Payer: PRIVATE HEALTH INSURANCE | Admitting: Internal Medicine

## 2017-05-06 VITALS — BP 130/90 | HR 88 | Temp 98.2°F | Ht 69.0 in | Wt 238.0 lb

## 2017-05-06 DIAGNOSIS — J0100 Acute maxillary sinusitis, unspecified: Secondary | ICD-10-CM

## 2017-05-06 MED ORDER — CLARITHROMYCIN 500 MG PO TABS: 500.0000 mg | ORAL_TABLET | Freq: Two times a day (BID) | ORAL | 0 refills | Status: DC

## 2017-05-06 MED ORDER — HYDROCODONE-HOMATROPINE 5-1.5 MG/5ML PO SYRP: 5.0000 mL | ORAL_SOLUTION | Freq: Three times a day (TID) | ORAL | 0 refills | Status: DC | PRN

## 2017-05-06 MED ORDER — PREDNISONE 10 MG PO TABS: ORAL_TABLET | ORAL | 0 refills | Status: DC

## 2017-05-06 NOTE — Progress Notes (Signed)
   Subjective:    Patient ID: Diana [name], female    DOB: Jun 19, 1955, 62 y.o.   MRN: [id number]  HPI 62 year old Female in with hoarseness and nasal congestion.  Leaving soon to go on vacation.  No fever or shaking chills.  Has some malaise and fatigue.    Review of Systems see above     Objective:   Physical Exam She sounds hoarse when she speaks.  Pharynx is clear.  TMs are clear.  Neck is supple without adenopathy.  Chest clear to auscultation.       Assessment & Plan:  Acute maxillary sinusitis  Plan: Biaxin 500 mg twice daily for 10 days.  Hycodan 1 teaspoon p.o. every 8 hours as needed cough.  Prednisone 10 mg #21 going from 60 mg to 0 mg over 7 days.  Rest and drink plenty of fluids

## 2017-05-15 NOTE — Patient Instructions (Signed)
Take prednisone and tapering course as directed.  Take Hycodan as needed for cough.  Biaxin 500 mg twice daily for 10 days.  Rest and drink plenty of fluids.

## 2017-06-17 ENCOUNTER — Encounter: Payer: Self-pay | Admitting: Cardiovascular Disease

## 2017-06-17 ENCOUNTER — Ambulatory Visit: Payer: PRIVATE HEALTH INSURANCE | Admitting: Cardiovascular Disease

## 2017-06-17 VITALS — BP 120/90 | HR 83 | Ht 69.0 in | Wt 256.8 lb

## 2017-06-17 DIAGNOSIS — Q211 Atrial septal defect, unspecified: Secondary | ICD-10-CM

## 2017-06-17 DIAGNOSIS — E782 Mixed hyperlipidemia: Secondary | ICD-10-CM | POA: Diagnosis not present

## 2017-06-17 NOTE — Patient Instructions (Signed)

## 2017-06-17 NOTE — Progress Notes (Signed)
Cardiology Office Note Date:  06/17/2017   ID:  DEAYSIA GRIGORYAN, DOB [birth year], MRN [id number]  PCP:  Elby Showers, MD  Cardiologist:  Sherren Mocha, MD    Chief Complaint  Patient presents with  . Hyperlipidemia     History of Present Illness: Diana [name] is a 62 y.o. female who presents for follow-up evaluation.  The patient has a small secundum ASD that has been followed with serial imaging.  Last echo 1 year ago demonstrated normal RV/RA size.  She has done well with conservative management and is asymptomatic.  The patient is here alone today.  She underwent total knee replacement last year and has done really well with this.  She is able to be more active and she has lost a good bit of weight.  She denies chest pain, shortness of breath, or heart palpitations.  She was prescribed pravastatin last year for treatment of hyperlipidemia.  This caused insomnia and she quit taking it.  Her symptoms resolved within a few days.  She continues to work on diet and lifestyle modification for treatment of her hyperlipidemia.  Past Medical History:  Diagnosis Date  . Anxiety    controlled  . Atrial septal defect    Small,non significant atrial septal defect with QP/QS of 1.25 by MRA.  . Cancer ([place])    melanoma left leg 2012  . Complication of anesthesia    gets migraine when she has to be NPO  . Depression   . Migraine    occasionally  . Migraine headache   . Obesity   . Obesity   . Obstructive apnea    CPAP user since 2012.   . OSA on CPAP 09/01/2013  . PONV (postoperative nausea and vomiting)    n/v from migraine, not anesthesia  . Pseudotumor cerebri Diagnosed in March 2008-   She sees Dr Clovis Riley in this regard  . Sleep apnea   . Statin intolerance    with elevated CRP    Past Surgical History:  Procedure Laterality Date  . BACK SURGERY    . BUNIONECTOMY    . FRACTURE SURGERY     left 5th metatarsal  . ORTHOPEDIC SURGERY     lumbar surgery   . PARTIAL KNEE  ARTHROPLASTY Right 09/15/2016   Procedure: UNICOMPARTMENTAL KNEE;  Surgeon: Marchia Bond, MD;  Location: [place];  Service: Orthopedics;  Laterality: Right;  . pin in the fifth metatarsal    . right knee arthroscopy      Current Outpatient Medications  Medication Sig Dispense Refill  . ALPRAZolam (XANAX) 0.5 MG tablet Take 0.5 mg by mouth daily as needed for anxiety.  0  . aspirin EC 81 MG tablet Take 1 tablet (81 mg total) by mouth daily.    . cetirizine (ZYRTEC) 10 MG tablet Take 10 mg by mouth daily.      Marland Kitchen levothyroxine (SYNTHROID, LEVOTHROID) 50 MCG tablet TAKE 1 TABLET BY MOUTH DAILY. 90 tablet 1  . Multiple Vitamin (MULTI-VITAMIN DAILY) TABS Take by mouth See admin instructions. doterra multi - takes 2 tablets of 3 different multis for a total of 6 tabs    . OVER THE COUNTER MEDICATION Take 1 tablet by mouth 3 (three) times daily as needed (to prevent sickness). onguard    . SUMAtriptan (IMITREX) 100 MG tablet TAKE 1 TABLET BY MOUTH ONCE. MAY REPEAT IN 2 HOURS IF HEADACHE PERSISTS OR RECURS. MAX 2 TABLETS IN 24 HOURS 9 tablet 2   No  current facility-administered medications for this visit.     Allergies:   Augmentin [amoxicillin-pot clavulanate]   Social History:  The patient  reports that she has never smoked. She has never used smokeless tobacco. She reports that she drinks alcohol. She reports that she does not use drugs.   Family History:  The patient's family history includes Alzheimer's disease in her mother; Rheumatic fever in her father.    ROS:  Please see the history of present illness.  All other systems are reviewed and negative.    PHYSICAL EXAM: VS:  BP 120/90   Pulse 83   Ht 5\' 9"  (1.753 m)   Wt 256 lb 12.8 oz (116.5 kg)   SpO2 94%   BMI 37.92 kg/m  , BMI Body mass index is 37.92 kg/m. GEN: Well nourished, well developed, in no acute distress  HEENT: normal  Neck: no JVD, no masses. No carotid bruits Cardiac: RRR without murmur or gallop                  Respiratory:  clear to auscultation bilaterally, normal work of breathing GI: soft, nontender, nondistended, + BS MS: no deformity or atrophy  Ext: no pretibial edema, pedal pulses 2+= bilaterally Skin: warm and dry, no rash Neuro:  Strength and sensation are intact Psych: euthymic mood, full affect  EKG:  EKG is ordered today. The ekg ordered today shows Normal sinus rhythm 83 bpm, within normal limits.  Recent Labs: 03/11/2017: ALT 25; BUN 17; Creat 0.85; Hemoglobin 13.6; Platelets 205; Potassium 4.1; Sodium 140; TSH 1.65   Lipid Panel     Component Value Date/Time   CHOL 209 (H) 03/11/2017 1059   TRIG 165 (H) 03/11/2017 1059   HDL 38 (L) 03/11/2017 1059   CHOLHDL 5.5 (H) 03/11/2017 1059   VLDL 35 (H) 02/13/2016 1115   LDLCALC 141 (H) 03/11/2017 1059      Wt Readings from Last 3 Encounters:  06/17/17 256 lb 12.8 oz (116.5 kg)  05/06/17 238 lb (108 kg)  04/08/17 238 lb (108 kg)    ASSESSMENT AND PLAN: 1.  Secundum ASD: Not hemodynamically significant based on normal right-sided cardiac chambers by echo assessment.  Small defect noted.  Appears to be asymptomatic. Plan clinical FU in one year, consider repeat echo at 3 year intervals.   2.  Hyperlipidemia: Statin intolerant.  Discussed primary prevention data.  She will continue to work with diet and lifestyle modification.  Current medicines are reviewed with the patient today.  The patient does not have concerns regarding medicines.  Labs/ tests ordered today include:   Orders Placed This Encounter  Procedures  . EKG 12-Lead    Disposition:   FU 1 year  Signed, Sherren Mocha, MD  06/17/2017 2:14 PM    [facility] [place], [place], [place]  [address] Phone: [phone]; Fax: [phone]

## 2017-09-14 ENCOUNTER — Other Ambulatory Visit: Payer: Self-pay | Admitting: Internal Medicine

## 2017-09-21 ENCOUNTER — Other Ambulatory Visit: Payer: Self-pay | Admitting: Internal Medicine

## 2017-09-21 DIAGNOSIS — E039 Hypothyroidism, unspecified: Secondary | ICD-10-CM

## 2017-09-21 DIAGNOSIS — E785 Hyperlipidemia, unspecified: Secondary | ICD-10-CM

## 2017-09-23 ENCOUNTER — Other Ambulatory Visit: Payer: PRIVATE HEALTH INSURANCE | Admitting: Internal Medicine

## 2017-09-30 ENCOUNTER — Ambulatory Visit: Payer: PRIVATE HEALTH INSURANCE | Admitting: Internal Medicine

## 2017-10-21 ENCOUNTER — Ambulatory Visit: Payer: No Typology Code available for payment source | Admitting: Psychology

## 2017-10-21 DIAGNOSIS — F411 Generalized anxiety disorder: Secondary | ICD-10-CM | POA: Diagnosis not present

## 2017-11-11 ENCOUNTER — Ambulatory Visit: Payer: No Typology Code available for payment source | Admitting: Psychology

## 2017-11-11 ENCOUNTER — Other Ambulatory Visit: Payer: PRIVATE HEALTH INSURANCE | Admitting: Internal Medicine

## 2017-11-11 DIAGNOSIS — E039 Hypothyroidism, unspecified: Secondary | ICD-10-CM

## 2017-11-11 DIAGNOSIS — E785 Hyperlipidemia, unspecified: Secondary | ICD-10-CM

## 2017-11-11 DIAGNOSIS — F411 Generalized anxiety disorder: Secondary | ICD-10-CM

## 2017-11-12 LAB — LIPID PANEL
CHOL/HDL RATIO: 5 (calc) — AB (ref <5.0)
CHOLESTEROL: 225 mg/dL — AB (ref <200)
HDL: 45 mg/dL — AB (ref >50)
LDL CHOLESTEROL (CALC): 153 mg/dL — AB
Non-HDL Cholesterol (Calc): 180 mg/dL (calc) — ABNORMAL HIGH (ref <130)
Triglycerides: 141 mg/dL (ref <150)

## 2017-11-12 LAB — HEPATIC FUNCTION PANEL
AG RATIO: 1.6 (calc) (ref 1.0–2.5)
ALKALINE PHOSPHATASE (APISO): 72 U/L (ref 33–130)
ALT: 25 U/L (ref 6–29)
AST: 28 U/L (ref 10–35)
Albumin: 4.1 g/dL (ref 3.6–5.1)
BILIRUBIN TOTAL: 0.3 mg/dL (ref 0.2–1.2)
Bilirubin, Direct: 0 mg/dL (ref 0.0–0.2)
Globulin: 2.5 g/dL (calc) (ref 1.9–3.7)
Indirect Bilirubin: 0.3 mg/dL (calc) (ref 0.2–1.2)
Total Protein: 6.6 g/dL (ref 6.1–8.1)

## 2017-11-12 LAB — TSH: TSH: 3.23 mIU/L (ref 0.40–4.50)

## 2017-11-15 ENCOUNTER — Other Ambulatory Visit: Payer: PRIVATE HEALTH INSURANCE | Admitting: Internal Medicine

## 2017-11-18 ENCOUNTER — Encounter: Payer: Self-pay | Admitting: Internal Medicine

## 2017-11-18 ENCOUNTER — Ambulatory Visit: Payer: PRIVATE HEALTH INSURANCE | Admitting: Internal Medicine

## 2017-11-18 VITALS — BP 130/90 | HR 80 | Temp 98.1°F | Ht 69.0 in | Wt 270.0 lb

## 2017-11-18 DIAGNOSIS — J22 Unspecified acute lower respiratory infection: Secondary | ICD-10-CM | POA: Diagnosis not present

## 2017-11-18 DIAGNOSIS — E782 Mixed hyperlipidemia: Secondary | ICD-10-CM | POA: Diagnosis not present

## 2017-11-18 DIAGNOSIS — E039 Hypothyroidism, unspecified: Secondary | ICD-10-CM | POA: Diagnosis not present

## 2017-11-18 DIAGNOSIS — Z6839 Body mass index (BMI) 39.0-39.9, adult: Secondary | ICD-10-CM

## 2017-11-18 MED ORDER — HYDROCODONE-HOMATROPINE 5-1.5 MG/5ML PO SYRP: 5.0000 mL | ORAL_SOLUTION | Freq: Three times a day (TID) | ORAL | 0 refills | Status: DC | PRN

## 2017-11-18 MED ORDER — CLARITHROMYCIN 500 MG PO TABS: 500.0000 mg | ORAL_TABLET | Freq: Two times a day (BID) | ORAL | 0 refills | Status: DC

## 2017-11-18 MED ORDER — LEVOTHYROXINE SODIUM 50 MCG PO TABS: 50.0000 ug | ORAL_TABLET | Freq: Every day | ORAL | 1 refills | Status: DC

## 2017-11-18 MED ORDER — CLARITHROMYCIN 500 MG PO TABS: 500.0000 mg | ORAL_TABLET | Freq: Two times a day (BID) | ORAL | 1 refills | Status: DC

## 2017-11-18 MED ORDER — SUMATRIPTAN SUCCINATE 100 MG PO TABS: ORAL_TABLET | ORAL | 2 refills | Status: DC

## 2017-11-18 NOTE — Progress Notes (Signed)
   Subjective:    Patient ID: Diana [name], female    DOB: 1955/09/26, 62 y.o.   MRN: [id number]  HPI 62 year old Female with hypothyroidism and hyperlipidemia. May need referral to lipid clinic. Says she cannot tolerate Crestor. Promises to work harder on diet. Wants another 6 month trial of diet and exercise.   May have missed some doses of thyroid replacement med recently. TSH elevated but still WNL.  Has developed lower respiratory infection with cough and discolored sputum. No fever or chills.    Review of Systems no other new isues except going to [place] in November and inquiring about what medication she may need to take with her     Objective:   Physical Exam  Constitutional: She appears well-developed and well-nourished.  HENT:  Right Ear: External ear normal.  Left Ear: External ear normal.  Mouth/Throat: Oropharynx is clear and moist.  Eyes: Right eye exhibits no discharge. Left eye exhibits no discharge.  Neck: Neck supple. No JVD present. No thyromegaly present.  Cardiovascular: Normal rate, regular rhythm and normal heart sounds.  Pulmonary/Chest: Effort normal and breath sounds normal. No stridor. No respiratory distress. She has no wheezes.  Sounds hoarse when she speaks  Lymphadenopathy:    She has no cervical adenopathy.  Skin: Skin is warm and dry.  Vitals reviewed.         Assessment & Plan:  Hyperlipidemia-patient will try to improve with diet and exercise regimen and follow-up in 6 months  Hypothyroidism- patient needs to take thyroid replacement regularly  Acute lower respiratory infection-Biaxin 500 mg twice daily for 10 days.  Hycodan 1 teaspoon p.o. every 6-8 hours as needed cough.  One refill on Biaxin to take with her to [place]  25 minutes spent with patient

## 2017-11-18 NOTE — Patient Instructions (Signed)
Take thyroid medication regularly.  Prescribed Biaxin 500 mg twice daily for 10 days and Hycodan 1 teaspoon p.o. every 8 hours as needed cough for lower respiratory infection.  1 refill by accident to take with you to [place] in November.  Please work on diet exercise and weight loss.  We will follow-up in 6 months regarding mixed hyperlipidemia and if not improved will need to consider referring you to lipid clinic.

## 2017-11-25 ENCOUNTER — Ambulatory Visit: Payer: No Typology Code available for payment source | Admitting: Psychology

## 2017-11-25 DIAGNOSIS — F411 Generalized anxiety disorder: Secondary | ICD-10-CM

## 2017-12-02 ENCOUNTER — Ambulatory Visit: Payer: No Typology Code available for payment source | Admitting: Psychology

## 2018-01-06 ENCOUNTER — Ambulatory Visit: Payer: No Typology Code available for payment source | Admitting: Psychology

## 2018-01-06 DIAGNOSIS — F411 Generalized anxiety disorder: Secondary | ICD-10-CM | POA: Diagnosis not present

## 2018-02-10 ENCOUNTER — Ambulatory Visit: Payer: PRIVATE HEALTH INSURANCE | Admitting: Psychology

## 2018-03-10 ENCOUNTER — Ambulatory Visit: Payer: No Typology Code available for payment source | Admitting: Psychology

## 2018-03-10 DIAGNOSIS — F411 Generalized anxiety disorder: Secondary | ICD-10-CM | POA: Diagnosis not present

## 2018-03-18 ENCOUNTER — Other Ambulatory Visit: Payer: Self-pay | Admitting: Orthopedic Surgery

## 2018-03-21 ENCOUNTER — Telehealth: Payer: Self-pay | Admitting: *Deleted

## 2018-03-21 ENCOUNTER — Encounter (HOSPITAL_BASED_OUTPATIENT_CLINIC_OR_DEPARTMENT_OTHER): Payer: Self-pay | Admitting: *Deleted

## 2018-03-21 ENCOUNTER — Other Ambulatory Visit: Payer: Self-pay

## 2018-03-21 NOTE — Telephone Encounter (Signed)
This is fine. Ok to hold aspirin 5-7 days as needed. thanks

## 2018-03-21 NOTE — Telephone Encounter (Signed)
   [facility] Pre-operative Risk Assessment    Request for surgical clearance:  1. What type of surgery is being performed? EXCISION OF CYST. DEBRIDEMENT OF DISTAL INTERPHALANGEAL JOINT LEFT MIDDLE FINGER   2. When is this surgery scheduled? 03/22/18   3. What type of clearance is required (medical clearance vs. Pharmacy clearance to hold med vs. Both)? MEDICAL  4. Are there any medications that need to be held prior to surgery and how long?ASA   5. Practice name and name of physician performing surgery? [facility]; DR. Fredna Dow    6. What is your office phone number [phone]    7.   What is your office fax number [phone]  8.   Anesthesia type (None, local, MAC, general) ? IV REGIONAL FOREARM BLOCK   Melena Hayes 03/21/2018, 8:36 AM  _________________________________________________________________   (provider comments below)

## 2018-03-21 NOTE — Telephone Encounter (Signed)
Dr. Burt Knack, can you comment on holding aspirin for finger surgery. Pt with small secundum ASD.  Please route response back to P CV DIV PREOP  Thanks, Gae Bon

## 2018-03-21 NOTE — Telephone Encounter (Signed)
   Primary Cardiologist: No primary care provider on file.  Chart reviewed as part of pre-operative protocol coverage. Patient was contacted 03/21/2018 in reference to pre-operative risk assessment for pending surgery as outlined below.  Diana [name] was last seen on 06/17/2017 by cooper.  Since that day, Diana [name] has done well with no new cardiac complaints. She says that her surgery with be with a regional block, no general anesthesia. It is scheduled for tomorrow and she has already been holding aspirin.  Per Dr. Burt Knack, it is OK to hold aspirin for 5-7 days as needed.   Therefore, based on ACC/AHA guidelines, the patient would be at acceptable risk for the planned procedure without further cardiovascular testing.   I will route this recommendation to the requesting party via Epic fax function and remove from pre-op pool.  Please call with questions.  Daune Perch, NP 03/21/2018, 3:28 PM

## 2018-03-22 ENCOUNTER — Encounter (HOSPITAL_BASED_OUTPATIENT_CLINIC_OR_DEPARTMENT_OTHER)
Admission: RE | Disposition: A | Discharge status: Home / Self Care | Payer: Self-pay | Source: Home / Self Care | Attending: Orthopedic Surgery

## 2018-03-22 ENCOUNTER — Ambulatory Visit (HOSPITAL_BASED_OUTPATIENT_CLINIC_OR_DEPARTMENT_OTHER): Payer: PRIVATE HEALTH INSURANCE | Admitting: Certified Registered"

## 2018-03-22 ENCOUNTER — Other Ambulatory Visit: Payer: Self-pay

## 2018-03-22 ENCOUNTER — Ambulatory Visit (HOSPITAL_BASED_OUTPATIENT_CLINIC_OR_DEPARTMENT_OTHER)
Admission: RE | Admit: 2018-03-22 | Discharge: 2018-03-22 | Disposition: A | Discharge status: Home / Self Care | Payer: PRIVATE HEALTH INSURANCE | Attending: Orthopedic Surgery | Admitting: Orthopedic Surgery

## 2018-03-22 ENCOUNTER — Encounter (HOSPITAL_BASED_OUTPATIENT_CLINIC_OR_DEPARTMENT_OTHER): Payer: Self-pay

## 2018-03-22 DIAGNOSIS — Z820 Family history of epilepsy and other diseases of the nervous system: Secondary | ICD-10-CM | POA: Diagnosis not present

## 2018-03-22 DIAGNOSIS — E039 Hypothyroidism, unspecified: Secondary | ICD-10-CM | POA: Insufficient documentation

## 2018-03-22 DIAGNOSIS — Q211 Atrial septal defect: Secondary | ICD-10-CM | POA: Diagnosis not present

## 2018-03-22 DIAGNOSIS — Z888 Allergy status to other drugs, medicaments and biological substances status: Secondary | ICD-10-CM | POA: Insufficient documentation

## 2018-03-22 DIAGNOSIS — G43909 Migraine, unspecified, not intractable, without status migrainosus: Secondary | ICD-10-CM | POA: Insufficient documentation

## 2018-03-22 DIAGNOSIS — Z881 Allergy status to other antibiotic agents status: Secondary | ICD-10-CM | POA: Diagnosis not present

## 2018-03-22 DIAGNOSIS — E669 Obesity, unspecified: Secondary | ICD-10-CM | POA: Diagnosis not present

## 2018-03-22 DIAGNOSIS — G4733 Obstructive sleep apnea (adult) (pediatric): Secondary | ICD-10-CM | POA: Insufficient documentation

## 2018-03-22 DIAGNOSIS — M19042 Primary osteoarthritis, left hand: Secondary | ICD-10-CM | POA: Insufficient documentation

## 2018-03-22 DIAGNOSIS — Z6841 Body Mass Index (BMI) 40.0 and over, adult: Secondary | ICD-10-CM | POA: Diagnosis not present

## 2018-03-22 DIAGNOSIS — Z85820 Personal history of malignant melanoma of skin: Secondary | ICD-10-CM | POA: Diagnosis not present

## 2018-03-22 DIAGNOSIS — F419 Anxiety disorder, unspecified: Secondary | ICD-10-CM | POA: Diagnosis not present

## 2018-03-22 DIAGNOSIS — Z880 Allergy status to penicillin: Secondary | ICD-10-CM | POA: Insufficient documentation

## 2018-03-22 DIAGNOSIS — M25842 Other specified joint disorders, left hand: Secondary | ICD-10-CM | POA: Diagnosis not present

## 2018-03-22 DIAGNOSIS — F329 Major depressive disorder, single episode, unspecified: Secondary | ICD-10-CM | POA: Insufficient documentation

## 2018-03-22 DIAGNOSIS — Z96651 Presence of right artificial knee joint: Secondary | ICD-10-CM | POA: Insufficient documentation

## 2018-03-22 DIAGNOSIS — Z8489 Family history of other specified conditions: Secondary | ICD-10-CM | POA: Diagnosis not present

## 2018-03-22 DIAGNOSIS — M25742 Osteophyte, left hand: Secondary | ICD-10-CM | POA: Diagnosis not present

## 2018-03-22 HISTORY — DX: Hypothyroidism, unspecified: E03.9

## 2018-03-22 HISTORY — PX: MASS EXCISION: SHX2000

## 2018-03-22 SURGERY — EXCISION MASS
Anesthesia: Regional | Site: Finger | Laterality: Left

## 2018-03-22 MED ORDER — SCOPOLAMINE 1 MG/3DAYS TD PT72: 1.0000 | MEDICATED_PATCH | Freq: Once | TRANSDERMAL | Status: DC | PRN

## 2018-03-22 MED ORDER — MIDAZOLAM HCL 2 MG/2ML IJ SOLN
1.0000 mg | INTRAMUSCULAR | Status: DC | PRN
  Administered 2018-03-22: 1 mg via INTRAVENOUS

## 2018-03-22 MED ORDER — VANCOMYCIN HCL IN DEXTROSE 1-5 GM/200ML-% IV SOLN
1000.0000 mg | INTRAVENOUS | Status: AC
  Administered 2018-03-22 (×2): 1000 mg via INTRAVENOUS

## 2018-03-22 MED ORDER — MIDAZOLAM HCL 2 MG/2ML IJ SOLN
INTRAMUSCULAR | Status: AC
  Filled 2018-03-22: qty 2

## 2018-03-22 MED ORDER — FENTANYL CITRATE (PF) 100 MCG/2ML IJ SOLN
INTRAMUSCULAR | Status: AC
  Filled 2018-03-22: qty 2

## 2018-03-22 MED ORDER — ONDANSETRON HCL 4 MG/2ML IJ SOLN
INTRAMUSCULAR | Status: AC
  Filled 2018-03-22: qty 2

## 2018-03-22 MED ORDER — BUPIVACAINE HCL (PF) 0.25 % IJ SOLN
INTRAMUSCULAR | Status: DC | PRN
  Administered 2018-03-22: 9 mL

## 2018-03-22 MED ORDER — ONDANSETRON HCL 4 MG/2ML IJ SOLN
INTRAMUSCULAR | Status: DC | PRN
  Administered 2018-03-22: 4 mg via INTRAVENOUS

## 2018-03-22 MED ORDER — FENTANYL CITRATE (PF) 100 MCG/2ML IJ SOLN
50.0000 ug | INTRAMUSCULAR | Status: DC | PRN
  Administered 2018-03-22 (×2): 50 ug via INTRAVENOUS

## 2018-03-22 MED ORDER — OXYCODONE HCL 5 MG PO TABS: 5.0000 mg | ORAL_TABLET | Freq: Once | ORAL | Status: DC | PRN

## 2018-03-22 MED ORDER — LIDOCAINE HCL (PF) 0.5 % IJ SOLN
INTRAMUSCULAR | Status: DC | PRN
  Administered 2018-03-22: 40 mL via INTRAVENOUS

## 2018-03-22 MED ORDER — OXYCODONE HCL 5 MG/5ML PO SOLN: 5.0000 mg | Freq: Once | ORAL | Status: DC | PRN

## 2018-03-22 MED ORDER — PROPOFOL 500 MG/50ML IV EMUL
INTRAVENOUS | Status: DC | PRN
  Administered 2018-03-22: 50 ug/kg/min via INTRAVENOUS

## 2018-03-22 MED ORDER — FENTANYL CITRATE (PF) 100 MCG/2ML IJ SOLN: 25.0000 ug | INTRAMUSCULAR | Status: DC | PRN

## 2018-03-22 MED ORDER — BUPIVACAINE HCL (PF) 0.25 % IJ SOLN
INTRAMUSCULAR | Status: AC
  Filled 2018-03-22: qty 90

## 2018-03-22 MED ORDER — CHLORHEXIDINE GLUCONATE 4 % EX LIQD: 60.0000 mL | Freq: Once | CUTANEOUS | Status: DC

## 2018-03-22 MED ORDER — ONDANSETRON HCL 4 MG/2ML IJ SOLN: 4.0000 mg | Freq: Once | INTRAMUSCULAR | Status: DC | PRN

## 2018-03-22 MED ORDER — VANCOMYCIN HCL IN DEXTROSE 1-5 GM/200ML-% IV SOLN
INTRAVENOUS | Status: AC
  Filled 2018-03-22: qty 200

## 2018-03-22 MED ORDER — LACTATED RINGERS IV SOLN
INTRAVENOUS | Status: DC
  Administered 2018-03-22: 08:00:00 via INTRAVENOUS

## 2018-03-22 SURGICAL SUPPLY — 48 items
BLADE SURG 15 STRL LF DISP TIS (BLADE) ×1 IMPLANT
BLADE SURG 15 STRL SS (BLADE) ×3
BNDG CMPR 9X4 STRL LF SNTH (GAUZE/BANDAGES/DRESSINGS) ×1
BNDG COHESIVE 1X5 TAN STRL LF (GAUZE/BANDAGES/DRESSINGS) ×2 IMPLANT
BNDG COHESIVE 2X5 TAN STRL LF (GAUZE/BANDAGES/DRESSINGS) IMPLANT
BNDG COHESIVE 3X5 TAN STRL LF (GAUZE/BANDAGES/DRESSINGS) IMPLANT
BNDG ESMARK 4X9 LF (GAUZE/BANDAGES/DRESSINGS) ×2 IMPLANT
BNDG GAUZE ELAST 4 BULKY (GAUZE/BANDAGES/DRESSINGS) IMPLANT
CHLORAPREP W/TINT 26ML (MISCELLANEOUS) ×3 IMPLANT
CORD BIPOLAR FORCEPS 12FT (ELECTRODE) ×1 IMPLANT
COVER BACK TABLE 60X90IN (DRAPES) ×3 IMPLANT
COVER MAYO STAND STRL (DRAPES) ×3 IMPLANT
COVER WAND RF STERILE (DRAPES) IMPLANT
CUFF TOURNIQUET SINGLE 18IN (TOURNIQUET CUFF) ×2 IMPLANT
DECANTER SPIKE VIAL GLASS SM (MISCELLANEOUS) IMPLANT
DRAIN PENROSE 1/2X12 LTX STRL (WOUND CARE) IMPLANT
DRAPE EXTREMITY T 121X128X90 (DISPOSABLE) ×3 IMPLANT
DRAPE SURG 17X23 STRL (DRAPES) ×3 IMPLANT
GAUZE SPONGE 4X4 12PLY STRL (GAUZE/BANDAGES/DRESSINGS) ×3 IMPLANT
GAUZE XEROFORM 1X8 LF (GAUZE/BANDAGES/DRESSINGS) ×3 IMPLANT
GLOVE BIO SURGEON STRL SZ 6.5 (GLOVE) ×1 IMPLANT
GLOVE BIO SURGEONS STRL SZ 6.5 (GLOVE) ×1
GLOVE BIOGEL PI IND STRL 7.0 (GLOVE) IMPLANT
GLOVE BIOGEL PI IND STRL 8.5 (GLOVE) ×1 IMPLANT
GLOVE BIOGEL PI INDICATOR 7.0 (GLOVE) ×4
GLOVE BIOGEL PI INDICATOR 8.5 (GLOVE) ×2
GLOVE SURG ORTHO 8.0 STRL STRW (GLOVE) ×3 IMPLANT
GOWN STRL REUS W/ TWL LRG LVL3 (GOWN DISPOSABLE) ×1 IMPLANT
GOWN STRL REUS W/TWL LRG LVL3 (GOWN DISPOSABLE) ×3
GOWN STRL REUS W/TWL XL LVL3 (GOWN DISPOSABLE) ×3 IMPLANT
NDL PRECISIONGLIDE 27X1.5 (NEEDLE) ×1 IMPLANT
NDL SAFETY ECLIPSE 18X1.5 (NEEDLE) IMPLANT
NEEDLE HYPO 18GX1.5 SHARP (NEEDLE)
NEEDLE PRECISIONGLIDE 27X1.5 (NEEDLE) ×3 IMPLANT
NS IRRIG 1000ML POUR BTL (IV SOLUTION) ×3 IMPLANT
PACK BASIN DAY SURGERY FS (CUSTOM PROCEDURE TRAY) ×3 IMPLANT
PAD CAST 3X4 CTTN HI CHSV (CAST SUPPLIES) IMPLANT
PADDING CAST COTTON 3X4 STRL (CAST SUPPLIES)
SPLINT FINGER 3.25 BULB 911905 (SOFTGOODS) ×2 IMPLANT
SPLINT PLASTER CAST XFAST 3X15 (CAST SUPPLIES) IMPLANT
SPLINT PLASTER XTRA FASTSET 3X (CAST SUPPLIES)
STOCKINETTE 4X48 STRL (DRAPES) ×3 IMPLANT
SUT ETHILON 4 0 PS 2 18 (SUTURE) ×3 IMPLANT
SUT VIC AB 4-0 P2 18 (SUTURE) IMPLANT
SYR BULB 3OZ (MISCELLANEOUS) ×3 IMPLANT
SYR CONTROL 10ML LL (SYRINGE) ×3 IMPLANT
TOWEL GREEN STERILE FF (TOWEL DISPOSABLE) ×3 IMPLANT
UNDERPAD 30X30 (UNDERPADS AND DIAPERS) ×3 IMPLANT

## 2018-03-22 NOTE — Op Note (Signed)
NAME: Diana [name] MEDICAL RECORD NO: [id number] DATE OF BIRTH: [birth year] FACILITY: [facility] LOCATION: [facility] PHYSICIAN: Wynonia Sours, MD   OPERATIVE REPORT   DATE OF PROCEDURE: 03/22/18    PREOPERATIVE DIAGNOSIS:   Mucoid cyst with degenerative arthritis distal phalangeal joint left middle finger   POSTOPERATIVE DIAGNOSIS:   Same   PROCEDURE:   Excision mucoid cyst debridement distal phalangeal joint osteophytes and synovectomy   SURGEON: Daryll Brod, M.D.   ASSISTANT: none   ANESTHESIA:  Bier block with sedation and Local   INTRAVENOUS FLUIDS:  Per anesthesia flow sheet.   ESTIMATED BLOOD LOSS:  Minimal.   COMPLICATIONS:  None.   SPECIMENS:   Osteophytes cyst and synovium   TOURNIQUET TIME:    Total Tourniquet Time Documented: Forearm (Right) - 23 minutes Total: Forearm (Right) - 23 minutes    DISPOSITION:  Stable to PACU.   INDICATIONS: Patient is a 63 year old female with a history of a recent enlargement of the distal phalangeal joint with cystic formation left middle finger.  The skin is becoming translucent.  X-rays reveal mild degenerative changes.  She has recommended to undergo excision of the cyst with debridement distal phalangeal joint.  Pre-peri-and postoperative course been discussed along with risks and complications.  She is aware there is no guarantee to the surgery the possibility of infection recurrence injury to arteries nerves tendons incomplete relief of symptoms and dystrophy.  In the preoperative area the patient is seen the extremity is marked by both patient and surgeon antibiotic given.  OPERATIVE COURSE: She was brought to the operating room where form based IV regional anesthetic was carried out without difficulty under the direction of the anesthesia department.  She was prepped using ChloraPrep in a supine position with a left arm free.  A three-minute dry time was allowed timeout taken to confirm patient procedure.  A  metacarpal block was given quarter percent bupivacaine without epinephrine  using 8 cc.  A curvilinear incision was made over the dorsal aspect of the distal phalangeal joint left middle finger and then laterally.  This carried down through subcutaneous tissue.  The cyst was immediately encountered.  With blunt sharp dissection this was dissected free.  The joint was then opened just volar to the extensor tendon.  A synovectomy was performed with hemostatic rondure.  Osteophytes were removed with the hemostatic rondure and house curette.  Specimen was sent to pathology.  No further lesions were noted.  The wound was copious irrigated with saline.  The skin was closed erupted 4-0 nylon sutures.  A sterile compressive dressing and splint to the finger was applied.  Inflation of the tourniquet all fingers immediately pink.  She was taken to the recovery room for observation in satisfactory condition.  She will be discharged home to return hand center [facility] in 1 week she will take Tylenol ibuprofen and has tramadol at home.   Daryll Brod, MD Electronically signed, 03/22/18

## 2018-03-22 NOTE — Transfer of Care (Signed)
Immediate Anesthesia Transfer of Care Note  Patient: Diana [name]  Procedure(s) Performed: EXCISION CYST DEBRIDEMENT DISTAL INTERPHALANGEAL LEFT MIDDLE FINGER (Left Finger)  Patient Location: PACU  Anesthesia Type:MAC and Bier block  Level of Consciousness: awake, alert  and oriented  Airway & Oxygen Therapy: Patient Spontanous Breathing  Post-op Assessment: Report given to RN and Post -op Vital signs reviewed and stable  Post vital signs: Reviewed and stable  Last Vitals:  Vitals Value Taken Time  BP    Temp    Pulse    Resp    SpO2      Last Pain:  Vitals:   03/22/18 0734  TempSrc: Oral  PainSc: 0-No pain      Patients Stated Pain Goal: 3 (45/[id number]/56 [id number])  Complications: No apparent anesthesia complications

## 2018-03-22 NOTE — H&P (Signed)
Diana [name] is an 63 y.o. female.   Chief Complaint: mass left middle finger GYJ:EHUDJ is a 63 year old right-hand-dominant female seen with a complaint of a mass at the distal phalangeal joint lateral aspect left middle finger which is been present for the past 2 to 3 weeks. She recalls no history of injury. She is not having any pain. She does have a history of arthritis history of diabetes or gout. She does have history of thyroid problems. She has no history of injury. History is positive arthritis negative for the remainder. Not taken anything for this. Is gradually enlarged. Nothing seems to make it better or worse.    Past Medical History:  Diagnosis Date  . Anxiety    controlled  . Atrial septal defect    Small,non significant atrial septal defect with QP/QS of 1.25 by MRA.  . Cancer ([facility])    melanoma left leg 2012  . Complication of anesthesia    gets migraine when she has to be NPO  . Depression   . Hypothyroidism   . Migraine    occasionally  . Migraine headache   . Obesity   . Obstructive apnea    CPAP user since 2012.   Marland Kitchen PONV (postoperative nausea and vomiting)    n/v from migraine, not anesthesia  . Pseudotumor cerebri Diagnosed in March 2008-   She sees Dr Clovis Riley in this regard  . Statin intolerance    with elevated CRP    Past Surgical History:  Procedure Laterality Date  . BACK SURGERY    . BUNIONECTOMY    . FRACTURE SURGERY     left 5th metatarsal  . ORTHOPEDIC SURGERY     lumbar surgery   . PARTIAL KNEE ARTHROPLASTY Right 09/15/2016   Procedure: UNICOMPARTMENTAL KNEE;  Surgeon: Marchia Bond, MD;  Location: [facility];  Service: Orthopedics;  Laterality: Right;  . pin in the fifth metatarsal    . right knee arthroscopy      Family History  Problem Relation Age of Onset  . Alzheimer's disease Mother   . Rheumatic fever Father    Social History:  reports that she has never smoked. She has never used smokeless tobacco. She reports current alcohol  use. She reports that she does not use drugs.  Allergies:  Allergies  Allergen Reactions  . Augmentin [Amoxicillin-Pot Clavulanate] Nausea And Vomiting    Has patient had a PCN reaction causing immediate rash, facial/tongue/throat swelling, SOB or lightheadedness with hypotension: No Has patient had a PCN reaction causing severe rash involving mucus membranes or skin necrosis: No Has patient had a PCN reaction that required hospitalization: No Has patient had a PCN reaction occurring within the last 10 years: #  #  #  YES  #  #  #  > 2014 If all of the above answers are "NO", then may proceed with Cephalosporin use.     No medications prior to admission.    No results found for this or any previous visit (from the past 48 hour(s)).  No results found.   Pertinent items are noted in HPI.  Height 5\' 9"  (1.753 m), weight 122.5 kg.  General appearance: alert, cooperative and appears stated age Head: Normocephalic, without obvious abnormality Neck: no JVD Resp: clear to auscultation bilaterally Cardio: regular rate and rhythm, S1, S2 normal, no murmur, click, rub or gallop GI: soft, non-tender; bowel sounds normal; no masses,  no organomegaly Extremities: mass left middle finger Pulses: 2+ and symmetric Skin: Skin color,  texture, turgor normal. No rashes or lesions Neurologic: Grossly normal Incision/Wound: na  Assessment/Plan Assessment:  Mucoid cyst, joint  Osteoarthritis of finger of left hand    Plan: She would like to have this excised. Pre-peri-and postoperative course are discussed along with risk and complications. She is aware there is no guarantee to the surgery the possibility of infection recurrence injury to arteries nerves tendons complete relief symptoms dystrophy possibility of recurrence. This to be scheduled as an outpatient under regional anesthesia for excision mucoid cyst debridement distal phalangeal joint left middle finger.    Daryll Brod 03/22/2018,  5:18 AM

## 2018-03-22 NOTE — Anesthesia Procedure Notes (Signed)
Anesthesia Regional Block: Bier block (IV Regional)   Pre-Anesthetic Checklist: ,, timeout performed, Correct Patient, Correct Site, Correct Laterality, Correct Procedure, Correct Position, site marked, Risks and benefits discussed,  Surgical consent,  Pre-op evaluation,  At surgeon's request and post-op pain management  Laterality: Left  Prep: alcohol swabs        Procedures:,,,,,, Esmarch exsanguination,, #20gu IV placed  Narrative:  CRNA: Verita Lamb, CRNA

## 2018-03-22 NOTE — Brief Op Note (Signed)
03/22/2018  8:53 AM  PATIENT:  Diana [name]  63 y.o. female  PRE-OPERATIVE DIAGNOSIS:  MUCOID CYST LEFT MIDDLE FINGER DEGENERATIVE JOINT DISEASE  POST-OPERATIVE DIAGNOSIS:  MUCOID CYST LEFT MIDDLE FINGER DEGENERATIVE JOINT DISEASE  PROCEDURE:  Procedure(s) with comments: EXCISION CYST DEBRIDEMENT DISTAL INTERPHALANGEAL LEFT MIDDLE FINGER (Left) - FAB  SURGEON:  Surgeon(s) and Role:    * Daryll Brod, MD - Primary  PHYSICIAN ASSISTANT:   ASSISTANTS: none   ANESTHESIA:   local, regional and IV sedation  EBL:  26ml  BLOOD ADMINISTERED:none  DRAINS: none   LOCAL MEDICATIONS USED:  BUPIVICAINE   SPECIMEN:  Excision  DISPOSITION OF SPECIMEN:  PATHOLOGY  COUNTS:  YES  TOURNIQUET:   Total Tourniquet Time Documented: Forearm (Right) - 23 minutes Total: Forearm (Right) - 23 minutes   DICTATION: .Viviann Spare Dictation  PLAN OF CARE: Discharge to home after PACU  PATIENT DISPOSITION:  PACU - hemodynamically stable.

## 2018-03-22 NOTE — Discharge Instructions (Addendum)

## 2018-03-22 NOTE — Anesthesia Postprocedure Evaluation (Signed)
Anesthesia Post Note  Patient: Diana [name]  Procedure(s) Performed: EXCISION CYST DEBRIDEMENT DISTAL INTERPHALANGEAL LEFT MIDDLE FINGER (Left Finger)     Patient location during evaluation: PACU Anesthesia Type: Bier Block Level of consciousness: awake and alert Pain management: pain level controlled Vital Signs Assessment: post-procedure vital signs reviewed and stable Respiratory status: spontaneous breathing, nonlabored ventilation and respiratory function stable Cardiovascular status: blood pressure returned to baseline and stable Postop Assessment: no apparent nausea or vomiting Anesthetic complications: no    Last Vitals:  Vitals:   03/22/18 0915 03/22/18 0930  BP: 140/66 107/71  Pulse: 85 78  Resp:    Temp:  36.8 C  SpO2: 97% 96%    Last Pain:  Vitals:   03/22/18 0930  TempSrc:   PainSc: 0-No pain                 Lidia Collum

## 2018-03-22 NOTE — Anesthesia Preprocedure Evaluation (Addendum)
Anesthesia Evaluation  Patient identified by MRN, date of birth, ID band Patient awake    Reviewed: Allergy & Precautions, NPO status , Patient's Chart, lab work & pertinent test results  History of Anesthesia Complications Negative for: history of anesthetic complications  Airway Mallampati: IV  TM Distance: >3 FB Neck ROM: Full  Mouth opening: Limited Mouth Opening  Dental no notable dental hx. (+) Teeth Intact   Pulmonary sleep apnea and Continuous Positive Airway Pressure Ventilation ,    Pulmonary exam normal        Cardiovascular Normal cardiovascular exam+ Valvular Problems/Murmurs (ASD)      Neuro/Psych  Headaches, PSYCHIATRIC DISORDERS Anxiety Depression    GI/Hepatic negative GI ROS, Neg liver ROS,   Endo/Other  Hypothyroidism Morbid obesity  Renal/GU negative Renal ROS  negative genitourinary   Musculoskeletal negative musculoskeletal ROS (+)   Abdominal   Peds  Hematology negative hematology ROS (+)   Anesthesia Other Findings 63 yo F for excision of mucoid cyst on left finger - anx/dep, OSA on CPAP, migraines, pseudotumor cerebri, ASD, hypothyroid, BMI 43 - TTE 06/04/16: EF 60-65%, possible PFO - Cleared at acceptable risk by Daune Perch, NP (Cardiology)  Reproductive/Obstetrics                            Anesthesia Physical Anesthesia Plan  ASA: III  Anesthesia Plan: Bier Block and Bier Block-LIDOCAINE ONLY   Post-op Pain Management:    Induction:   PONV Risk Score and Plan: 2 and Ondansetron, Treatment may vary due to age or medical condition and Propofol infusion  Airway Management Planned: Nasal Cannula and Simple Face Mask  Additional Equipment: None  Intra-op Plan:   Post-operative Plan:   Informed Consent: I have reviewed the patients History and Physical, chart, labs and discussed the procedure including the risks, benefits and alternatives for the  proposed anesthesia with the patient or authorized representative who has indicated his/her understanding and acceptance.       Plan Discussed with:   Anesthesia Plan Comments:        Anesthesia Quick Evaluation

## 2018-03-23 ENCOUNTER — Encounter (HOSPITAL_BASED_OUTPATIENT_CLINIC_OR_DEPARTMENT_OTHER): Payer: Self-pay | Admitting: Orthopedic Surgery

## 2018-03-24 ENCOUNTER — Other Ambulatory Visit: Payer: Self-pay | Admitting: Internal Medicine

## 2018-03-24 DIAGNOSIS — Z1231 Encounter for screening mammogram for malignant neoplasm of breast: Secondary | ICD-10-CM

## 2018-04-13 ENCOUNTER — Telehealth: Payer: Self-pay | Admitting: Neurology

## 2018-04-13 NOTE — Telephone Encounter (Signed)
Called the patient to inform them that our office has placed new protocols in place for our office visits. Due to the virus pandemic our office is reducing our number of office visits in order to minimize the risk to our patients and healthcare providers. Advised that our office is now providing the capability to offer the patients phone visits at this time if she would like. Pt states that she is absolutely stable and has been compliant with her machine and is getting supplies through [facility]. Patient was scheduled for June 11 at 10:30 with check in of 10 am

## 2018-04-14 ENCOUNTER — Ambulatory Visit: Payer: PRIVATE HEALTH INSURANCE | Admitting: Neurology

## 2018-04-15 ENCOUNTER — Telehealth: Payer: Self-pay | Admitting: Internal Medicine

## 2018-04-15 NOTE — Telephone Encounter (Signed)
Diana [name] called back and said to leave CPE in May, because it will be later than that before they can do surgery.

## 2018-04-15 NOTE — Telephone Encounter (Signed)
LVM for Diana [name] to call back and reschedule CPE sooner, so that we can get EKG and clear her for partial L knee replacement.

## 2018-04-19 ENCOUNTER — Telehealth: Payer: Self-pay

## 2018-04-19 NOTE — Telephone Encounter (Signed)
   [facility] Pre-operative Risk Assessment    Request for surgical clearance:  1. What type of surgery is being performed? Left Partial Knee Replacement   2. When is this surgery scheduled? Pending clearance   3. What type of clearance is required (medical clearance vs. Pharmacy clearance to hold med vs. Both)? Both  4. Are there any medications that need to be held prior to surgery and how long? Aspirin   5. Practice name and name of physician performing surgery? [facility], Dr. Marchia Bond   6. What is your office phone number [phone] [address]    7.   What is your office fax number [phone] Attn: Sherri  8.   Anesthesia type (None, local, MAC, general) ? Anesthesia choice   Michaelyn Barter 04/19/2018, 5:29 PM  _________________________________________________________________

## 2018-04-20 NOTE — Telephone Encounter (Signed)
   Primary Cardiologist: Sherren Mocha, MD  Chart reviewed as part of pre-operative protocol coverage. Patient was contacted 04/20/2018 in reference to pre-operative risk assessment for pending surgery as outlined below.  Diana [name] was last seen on 05/21/17 by Dr. Burt Knack.  Since that day, Diana [name] has done well with her known small secundum ASD.  She continues to be asymptomatic.     She could hold her Asprin for 5 days prior to procedure though with COVID 19 unsure when that will be.  I will attempt to have pt seen in June this year.  But she is low risk for surgery.    Therefore, based on ACC/AHA guidelines, the patient would be at acceptable risk for the planned procedure without further cardiovascular testing.   I will route this recommendation to the requesting party via Epic fax function and remove from pre-op pool.  Please call with questions.  Cecilie Kicks, NP 04/20/2018, 11:42 AM

## 2018-04-21 ENCOUNTER — Ambulatory Visit: Payer: Self-pay

## 2018-05-05 ENCOUNTER — Ambulatory Visit: Payer: PRIVATE HEALTH INSURANCE | Admitting: Psychology

## 2018-05-26 ENCOUNTER — Other Ambulatory Visit: Payer: PRIVATE HEALTH INSURANCE | Admitting: Internal Medicine

## 2018-05-26 ENCOUNTER — Other Ambulatory Visit: Payer: Self-pay

## 2018-05-26 DIAGNOSIS — Z85820 Personal history of malignant melanoma of skin: Secondary | ICD-10-CM

## 2018-05-26 DIAGNOSIS — Q211 Atrial septal defect, unspecified: Secondary | ICD-10-CM

## 2018-05-26 DIAGNOSIS — Z0000 Encounter for general adult medical examination without abnormal findings: Secondary | ICD-10-CM

## 2018-05-26 DIAGNOSIS — E039 Hypothyroidism, unspecified: Secondary | ICD-10-CM

## 2018-05-26 DIAGNOSIS — E782 Mixed hyperlipidemia: Secondary | ICD-10-CM

## 2018-05-26 DIAGNOSIS — Z8659 Personal history of other mental and behavioral disorders: Secondary | ICD-10-CM

## 2018-05-26 LAB — CBC WITH DIFFERENTIAL/PLATELET
Absolute Monocytes: 372 cells/uL (ref 200–950)
Basophils Absolute: 37 cells/uL (ref 0–200)
Basophils Relative: 0.6 %
Eosinophils Absolute: 322 cells/uL (ref 15–500)
Eosinophils Relative: 5.2 %
HCT: 41.5 % (ref 35.0–45.0)
Hemoglobin: 14.1 g/dL (ref 11.7–15.5)
Lymphs Abs: 2145 cells/uL (ref 850–3900)
MCH: 29.6 pg (ref 27.0–33.0)
MCHC: 34 g/dL (ref 32.0–36.0)
MCV: 87 fL (ref 80.0–100.0)
MPV: 10.1 fL (ref 7.5–12.5)
Monocytes Relative: 6 %
Neutro Abs: 3323 cells/uL (ref 1500–7800)
Neutrophils Relative %: 53.6 %
Platelets: 243 10*3/uL (ref 140–400)
RBC: 4.77 10*6/uL (ref 3.80–5.10)
RDW: 12.2 % (ref 11.0–15.0)
Total Lymphocyte: 34.6 %
WBC: 6.2 10*3/uL (ref 3.8–10.8)

## 2018-05-26 LAB — COMPLETE METABOLIC PANEL WITH GFR
AG Ratio: 1.6 (calc) (ref 1.0–2.5)
ALT: 23 U/L (ref 6–29)
AST: 23 U/L (ref 10–35)
Albumin: 4.4 g/dL (ref 3.6–5.1)
Alkaline phosphatase (APISO): 79 U/L (ref 37–153)
BUN: 21 mg/dL (ref 7–25)
CO2: 28 mmol/L (ref 20–32)
Calcium: 9.2 mg/dL (ref 8.6–10.4)
Chloride: 103 mmol/L (ref 98–110)
Creat: 0.93 mg/dL (ref 0.50–0.99)
GFR, Est African American: 75 mL/min/{1.73_m2} (ref >=60)
GFR, Est Non African American: 65 mL/min/{1.73_m2} (ref >=60)
Globulin: 2.8 g/dL (calc) (ref 1.9–3.7)
Glucose, Bld: 106 mg/dL — ABNORMAL HIGH (ref 65–99)
Potassium: 4 mmol/L (ref 3.5–5.3)
Sodium: 140 mmol/L (ref 135–146)
Total Bilirubin: 0.7 mg/dL (ref 0.2–1.2)
Total Protein: 7.2 g/dL (ref 6.1–8.1)

## 2018-05-26 LAB — LIPID PANEL
Cholesterol: 253 mg/dL — ABNORMAL HIGH (ref <200)
HDL: 40 mg/dL — ABNORMAL LOW (ref >=50)
LDL Cholesterol (Calc): 175 mg/dL (calc) — ABNORMAL HIGH
Non-HDL Cholesterol (Calc): 213 mg/dL (calc) — ABNORMAL HIGH (ref <130)
Total CHOL/HDL Ratio: 6.3 (calc) — ABNORMAL HIGH (ref <5.0)
Triglycerides: 223 mg/dL — ABNORMAL HIGH (ref <150)

## 2018-05-26 LAB — TSH: TSH: 3.02 mIU/L (ref 0.40–4.50)

## 2018-05-31 ENCOUNTER — Other Ambulatory Visit: Payer: Self-pay | Admitting: Internal Medicine

## 2018-06-02 ENCOUNTER — Telehealth: Payer: Self-pay | Admitting: Internal Medicine

## 2018-06-02 ENCOUNTER — Other Ambulatory Visit: Payer: Self-pay

## 2018-06-02 ENCOUNTER — Encounter: Payer: Self-pay | Admitting: Internal Medicine

## 2018-06-02 ENCOUNTER — Ambulatory Visit (INDEPENDENT_AMBULATORY_CARE_PROVIDER_SITE_OTHER): Payer: PRIVATE HEALTH INSURANCE | Admitting: Internal Medicine

## 2018-06-02 VITALS — BP 140/90 | HR 94 | Temp 98.3°F | Ht 69.0 in | Wt 265.0 lb

## 2018-06-02 DIAGNOSIS — Z0000 Encounter for general adult medical examination without abnormal findings: Secondary | ICD-10-CM | POA: Diagnosis not present

## 2018-06-02 DIAGNOSIS — Z8669 Personal history of other diseases of the nervous system and sense organs: Secondary | ICD-10-CM

## 2018-06-02 DIAGNOSIS — Z8659 Personal history of other mental and behavioral disorders: Secondary | ICD-10-CM | POA: Diagnosis not present

## 2018-06-02 DIAGNOSIS — Q211 Atrial septal defect, unspecified: Secondary | ICD-10-CM

## 2018-06-02 DIAGNOSIS — R7302 Impaired glucose tolerance (oral): Secondary | ICD-10-CM

## 2018-06-02 DIAGNOSIS — I10 Essential (primary) hypertension: Secondary | ICD-10-CM

## 2018-06-02 DIAGNOSIS — E039 Hypothyroidism, unspecified: Secondary | ICD-10-CM

## 2018-06-02 DIAGNOSIS — E782 Mixed hyperlipidemia: Secondary | ICD-10-CM

## 2018-06-02 LAB — POCT URINALYSIS DIPSTICK
Appearance: NEGATIVE
Bilirubin, UA: NEGATIVE
Blood, UA: NEGATIVE
Glucose, UA: NEGATIVE
Ketones, UA: NEGATIVE
Leukocytes, UA: NEGATIVE
Nitrite, UA: NEGATIVE
Odor: NEGATIVE
Protein, UA: NEGATIVE
Spec Grav, UA: 1.01 (ref 1.010–1.025)
Urobilinogen, UA: 0.2 E.U./dL
pH, UA: 6.5 (ref 5.0–8.0)

## 2018-06-02 NOTE — Progress Notes (Signed)
Subjective:    Patient ID: Diana [name], female    DOB: [birth year], 63 y.o.   MRN: [id number]  HPI 63 year old Female in today for health maintenance exam and evaluation of medical issues.  Patient is having issues with her  shoulder and has considerable pain.  She has seen Dr. Mardelle Matte and is anticipating having to have  shoulder arthroplasty in the near future.  She has contacted Dr. Burt Knack regarding her small secundum ASD and is asymptomatic.  They recommend she hold aspirin for 5 days before procedure.  Cardiology nurse practitioner has reviewed chart and feels she is at low risk for surgery.  However patient remains overweight.  Hemoglobin A1c 5.3%. She continues with significant mixed hyperlipidemia.  Total cholesterol is 253, HDL 40, triglycerides 220.  LDL cholesterol is 175.  This is increased from October 2019.  She always has had a low HDL cholesterol.  She does not want to be on statin therapy.  She might consider the cardiology lipid clinic for consultation.  Fasting glucose was 106.  TSH was normal.  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  She has enjoyed her time off.  In March 2018 she weighed 302 pounds and now weighs 265 pounds.  History of vitamin D deficiency.  In 2012 was diagnosed with malignant melanoma (treated at [facility] and had lymph node sampling.  She is followed by Dr. Ubaldo Glassing.  Lesion was Clark level IV.  History of sleep apnea.  Has had impaired glucose tolerance but now has normal hemoglobin A1c.  Has tried pravastatin.  She was intolerant of Crestor.  History of hypothyroidism on thyroid replacement therapy  History of anxiety depression managed with Pristiq and Xanax.  History of migraine headaches treated with Imitrex.  History of allergic rhinitis treated with Zyrtec.  Social history: Married.  No children.  Rarely consumes alcohol.  Maybe  once weekly.  Non-smoker.  Family history: Mother with history of migraine headaches.  Father died at age 60 with history of stroke and congestive heart failure.  One brother in good health.  Mother died at age 47 with colonic infarction but had history of Alzheimer's disease.  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  She had upper endoscopy by Dr. Alben Spittle which was normal.  History of possible pseudotumor cerebri in 2008.  She saw Dr. Erling Cruz and was thought to have papilledema versus pseudopapilledema with significant elevated opening pressures a lumbar puncture in 2008.  Had hemiarthroplasty right knee by Dr. Mardelle Matte in 2018.  History of vitamin D deficiency.  No known drug allergies.  Had Pneumovax immunization 2002.  Had sleep study by Dr. Annamaria Boots in 2006 but he felt she did not qualify for sleep apnea.  She has history of mild snoring.  She had a normal sleep study but some mild periodic limb movement with arousal.  They felt she had some degree of disordered breathing and upper airway resistance.    Review of Systems right shoulder pain     Objective:   Physical Exam Vital signs reviewed.  BMI 39.13.  Blood pressure 140/ Skin warm and dry.  Nodes none.  TMs and pharynx are clear.  Neck is supple without JVD thyromegaly or carotid bruits.  Chest clear to auscultation.  Cardiac exam regular rate and rhythm normal S1 and S2.  Abdomen soft nondistended without hepatosplenomegaly  masses or tenderness.  GYN exam deferred to GYN physician.  No lower extremity edema.  Neuro no gross focal deficits.  She has decreased range of motion right upper extremity due to pain from right shoulder derangement       Assessment & Plan:   shoulder derangement-scheduled for  shoulder surgery by Dr. Mardelle Matte in the near future  History of ASD followed by Dr. Burt Knack  Hyperlipidemia- does not tolerate Crestor.  Currently not taking  pravastatin.  Family history of stroke and heart failure in her father.  Can be referred  to lipid clinic at Dr. Antionette Char office  History of migraine headaches treated with Imitrex.  History of depression and anxiety.  BMI 39.13.  May benefit from Dr. Migdalia Dk weight loss clinic.  Hypothyroidism-TSH stable with thyroid replacement.  History of snoring but does not qualify for diagnosis of sleep apnea  Plan: Would like to see her again in 6 months for follow-up of these medical issues.

## 2018-06-02 NOTE — Telephone Encounter (Signed)
Pt CB and scheduled

## 2018-06-02 NOTE — Patient Instructions (Signed)
Referral to lipid clinic. Hgb AIC drawn.  Recheck in 6 months, Is ti have left knee arthroplasty this summer.

## 2018-06-02 NOTE — Telephone Encounter (Signed)
LVM to CB and schedule 6 month recheck, lipid, A1C

## 2018-06-03 LAB — HEMOGLOBIN A1C
Hgb A1c MFr Bld: 5.3 % of total Hgb (ref <5.7)
Mean Plasma Glucose: 105 (calc)
eAG (mmol/L): 5.8 (calc)

## 2018-06-09 ENCOUNTER — Ambulatory Visit: Payer: Self-pay

## 2018-06-19 ENCOUNTER — Encounter: Payer: Self-pay | Admitting: Internal Medicine

## 2018-06-22 ENCOUNTER — Telehealth: Payer: Self-pay | Admitting: Cardiovascular Disease

## 2018-06-22 ENCOUNTER — Other Ambulatory Visit: Payer: Self-pay | Admitting: Orthopedic Surgery

## 2018-06-22 NOTE — Telephone Encounter (Signed)
Patient is stating she is having surgery on 07/26/18. Patient states pre-op clearance was given to her already by [place], but she was told to call prior to her surgery to see if she needed to be seen in the office.  She did get a check up by her PCP.

## 2018-06-22 NOTE — Telephone Encounter (Signed)
See preop clearance 04/19/18. I will route to both preop as well as Dr. Antionette Char nurse Gerome Sam RN for further follow up.

## 2018-06-23 NOTE — Telephone Encounter (Signed)
I called her today, she is doing well no change from 04/20/2018.  I don't think she needs to be seen pre op in the current COVID pandemic office visit restrictions. I will send formal clearance again Dr Mardelle Matte.   Kerin Ransom PA-C 06/23/2018 2:36 PM

## 2018-06-23 NOTE — Telephone Encounter (Signed)
   Primary Cardiologist: Sherren Mocha, MD  Chart reviewed and patient contacted by phone today as part of pre-operative protocol coverage. She has had no issues since we last contacted her April- no chest pain, no unusual dyspnea. Given past medical history and time since last visit, based on ACC/AHA guidelines, Diana [name] would be at acceptable risk for the planned procedure without further cardiovascular testing.   I will route this recommendation to the requesting party via Epic fax function and remove from pre-op pool.  Please call with questions.  Kerin Ransom, PA-C 06/23/2018, 2:39 PM

## 2018-06-30 ENCOUNTER — Telehealth: Payer: Self-pay

## 2018-06-30 ENCOUNTER — Ambulatory Visit: Payer: Self-pay | Admitting: Family Medicine

## 2018-06-30 NOTE — Telephone Encounter (Signed)
Unable to get in contact with the patient to convert their office appt with Amy on 07/04/2018 into a mychart video visit. I left a voicemail asking the patient to return my call. Office number was provided.    If patient calls back please convert their office visit into a mychart video visit.

## 2018-06-30 NOTE — Telephone Encounter (Signed)
Pt gives consent for mychart video and to file insurance, she states it needs to be at a later time. She states may leave detailed message

## 2018-06-30 NOTE — Telephone Encounter (Signed)
Noted. Thank you. Link has been sent via [employer]

## 2018-07-04 ENCOUNTER — Telehealth (INDEPENDENT_AMBULATORY_CARE_PROVIDER_SITE_OTHER): Payer: PRIVATE HEALTH INSURANCE | Admitting: Family Medicine

## 2018-07-04 ENCOUNTER — Encounter: Payer: Self-pay | Admitting: Family Medicine

## 2018-07-04 DIAGNOSIS — G4733 Obstructive sleep apnea (adult) (pediatric): Secondary | ICD-10-CM | POA: Diagnosis not present

## 2018-07-04 DIAGNOSIS — Z9989 Dependence on other enabling machines and devices: Secondary | ICD-10-CM

## 2018-07-04 NOTE — Progress Notes (Signed)
PATIENT: Diana [name] DOB: 1955-12-24  REASON FOR VISIT: follow up HISTORY FROM: patient  Virtual Visit via Telephone Note  I connected with Diana [name] on 07/04/18 at 11:00 AM EDT by telephone and verified that I am speaking with the correct person using two identifiers.   I discussed the limitations, risks, security and privacy concerns of performing an evaluation and management service by telephone and the availability of in person appointments. I also discussed with the patient that there may be a patient responsible charge related to this service. The patient expressed understanding and agreed to proceed.   History of Present Illness:  07/04/18 Diana [name] is a 63 y.o. female here today for follow up of OSA on CPAP.  She is doing very well with CPAP therapy.  She is using her machine every night.  She states that she cannot sleep without it.  She is in need of new supplies.  06/04/2018 - 07/03/2018 07/03/2018 Usage days 30/30 days (100%) >= 4 hours 30 days (100%) < 4 hours 0 days (0%) Usage hours 231 hours 31 minutes Average usage (total days) 7 hours 43 minutes Average usage (days used) 7 hours 43 minutes Median usage (days used) 7 hours 43 minutes Total used hours (value since last reset - 07/03/2018) 7,298 hours AirSense 10 AutoSet Serial number [id number] Mode AutoSet Min Pressure 8 cmH2O Max Pressure 15 cmH2O EPR Fulltime EPR level 1 Response Standard Therapy Pressure - cmH2O Median: 9.0 95th percentile: 11.4 Maximum: 12.9 Leaks - L/min Median: 1.0 95th percentile: 13.2 Maximum: 21.4 Events per hour AI: 1.3 HI: 0.0 AHI: 1.3 Apnea Index Central: 0.1 Obstructive: 1.2 Unknown: 0.0 RERA Index 0.0 Cheyne-Stokes respiration (average duration per night) 0 minutes (0%)    History (copied from Dr Edwena Felty note on 04/08/2017)  HPI: Diana [name] is a 63  y.o. female seen here at [facility] as a referral from Dr. Caprice Beaver. She used to follow Dr. Erling Cruz, who  signed her CPAP orders, but she was never scheduled for a formal sleep evaluation.  Mrs. Moman underwent a split study on 9-28 2012. She has seen me yearly since then and has an AutoSet CPAP machine. Her sleep habits have not changed. She still not taking naps, she is not a shift worker, she usually goes to bed between 10 and 11 PM and rises at 6.00 in the morning, Most of the nights she will need an alarm to wake up in the morning. She will be received fresh and restored after a nocturnal sleep of about 7-7-1/2 hours.   07-29-12 for a follow up  visit : The patient used the auto titrator between . Average daily usage was 7 hours and 42 minutes, which is a very good result. Residual apnea was 0.9 or. She had very minor air leaks and her 95th percentile pressure on CPAP was 10 cm the window or for this Ultracet was between 7 and 15 cm ater she endorsed the Epworth sleepiness score it only took points today. I would like for her to have the machine set at 10 cm water with a peak TR function. The patient has always been compliant with the CPAP but this is in [place] port at the CPAP actually has reduced her apnea to a single-digit orbital single-digit level. She's 100% compliant. She had been placed on an auto-titrator before the SPLIT study and slept well with it. She tried changing to a different sleep setting of 9 cm water, and resumed afterwards her  old autoset CPAP machine.  The patient never had a tonsillectomy. During her youth on [place] used a Health and safety inspector for retrognathia. She has no TMJ. She has a deformed anterior chest wall, Her estimated nocturnal sleep time about 7 hours.  She  does not complain of nocturia, as no breakthrough snoring or witnessed apneas.  The patient's on the cardiology history is a patent foramen ovale, she has or a body weight of 239 pounds at 5 feet 8 inches. Since the last sleep study she first gained over 60 pounds and lost already 25. College years the patient  has never worked shift work again., She does not take daytime naps. The patient is using her 63 year old autotitrator machine. She received a new one after her sleep study end of 2012 but was unable to tolerate it.  Fam. history: Her father has a history of excessive daytime sleepiness loud snoring and she witnessed several times apneas , also he was never formally diagnosed. He lived to be 95.   09-11-2015 Diana [name] presents today brokenhearted as her CPAP machine has a tight. He has used a AutoSet advantage the S8 machine for over 9 years. She has been a very compliant CPAP user with 100% compliance up to early August 2017 average user time of 7 hours 11 minutes, AutoSet is allowing pressures between 10 and 15 cm water was 1 cm EPR and the residual AHI was 1.2. Air leaks were very mild 91st percentile pressure was 12 cm water. I will sign a prescription and forwarded to [facility] for a new CPAP machine I would like her to have an air sense machine with an O2 sat between 8 and 15 cm water and 1 cm EPR. I will ask Graciella Belton to set the machine for her. She endorsed today the Epworth sleepiness score at 2 points, she does not endorse a high fatigue, she is not clinically depressed. She just would like to start CPAP.   Interval history on 04/02/2016, Diana [name] is here for her regular CPAP compliance visits and she has done excellent. 100% compliant 7 hours and 3 minutes average user time for the last 30 days, she's using an AutoSet between 8 and 15 cm water, and the 95th percentile pressure is 13.1 cm at night. The residual apneas is 0.6 per hour. Her husband believes that he heard some breakthrough snoring at night. 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   Interval history from 08 April 2017, I have the pleasure of meeting today with Diana [name], a 63 year old Caucasian CPAP user ,with a 93% compliance for the last 30 days.  Average use of time 6 hours and 55  minutes on CPAP nightly, the machine is an AutoSet between 8 and 15 cmH2O pressure was 1 cm expiratory pressure relief.  Residual AHI is 0.7, 95th percentile pressure is 10.8, there are no central apneas emerging, there is no high air leak.  The patient has benefited from CPAP therapy as her Epworth sleepiness score is endorsed at only 2 points and her fatigue severity at 10 points. She has noted how bad she felt when the power failed and she was without CPAP. She has a second, older CPAP machine at her second residence -   Observations/Objective:  Generalized: Well developed, in no acute distress  Mentation: Alert oriented to time, place, history taking. Follows all commands speech and language fluent   Assessment and Plan:  63 y.o. year old  female  has a past medical history of Anxiety, Atrial septal defect, Cancer ([place]), Complication of anesthesia, Depression, Hypothyroidism, Migraine, Migraine headache, Obesity, Obstructive apnea, PONV (postoperative nausea and vomiting), Pseudotumor cerebri (Diagnosed in March 2008-), and Statin intolerance. here with    ICD-10-CM   1. OSA on CPAP  G47.33 For home use only DME continuous positive airway pressure (CPAP)   Z99.89    She is doing very well with CPAP therapy at home.  Download report reveals excellent compliance.  She was encouraged to continue using her CPAP every night for greater than 4 hours each night.  I will place an order for supplies today.  She was advised to follow-up with [place] in 1 year.  She verbalizes understanding and agreement with this plan.  Appointment has been scheduled.  Orders Placed This Encounter  Procedures  . For home use only DME continuous positive airway pressure (CPAP)    Supplies please. Patient interested in new tubing    Order Specific Question:   Length of Need    Answer:   Lifetime    Order Specific Question:   Patient has OSA or probable OSA    Answer:   Yes    Order Specific Question:   Is the patient  currently using CPAP in the home    Answer:   Yes    Order Specific Question:   Settings    Answer:   Other see comments    Order Specific Question:   CPAP supplies needed    Answer:   Mask, headgear, cushions, filters, heated tubing and water chamber    No orders of the defined types were placed in this encounter.    Follow Up Instructions:  I discussed the assessment and treatment plan with the patient. The patient was provided an opportunity to ask questions and all were answered. The patient agreed with the plan and demonstrated an understanding of the instructions.   The patient was advised to call back or seek an in-person evaluation if the symptoms worsen or if the condition fails to improve as anticipated.  I provided 25 minutes of non-face-to-face time during this encounter.  Patient is located at her place of residence during teleconference.  Video visit converted to tele-visit due to technological issues.  Provider is located at her place of residence.  Maryelizabeth Kaufmann, CMA helped to facilitate visit.   Debbora Presto, NP

## 2018-07-08 ENCOUNTER — Telehealth: Payer: Self-pay | Admitting: Internal Medicine

## 2018-07-08 NOTE — Telephone Encounter (Signed)
Returning prereg- completed.

## 2018-07-08 NOTE — Telephone Encounter (Signed)
Follow up ° ° °Patient is returning your call. Please call. ° ° ° °

## 2018-07-08 NOTE — Telephone Encounter (Signed)
Smartphone/consent/ my chart/ pre reg completed °

## 2018-07-15 ENCOUNTER — Other Ambulatory Visit: Payer: Self-pay

## 2018-07-15 ENCOUNTER — Telehealth (INDEPENDENT_AMBULATORY_CARE_PROVIDER_SITE_OTHER): Payer: PRIVATE HEALTH INSURANCE | Admitting: Internal Medicine

## 2018-07-15 ENCOUNTER — Encounter: Payer: Self-pay | Admitting: Internal Medicine

## 2018-07-15 VITALS — BP 111/82 | HR 85 | Ht 69.0 in | Wt 285.0 lb

## 2018-07-15 DIAGNOSIS — E782 Mixed hyperlipidemia: Secondary | ICD-10-CM | POA: Diagnosis not present

## 2018-07-15 DIAGNOSIS — G4733 Obstructive sleep apnea (adult) (pediatric): Secondary | ICD-10-CM

## 2018-07-15 DIAGNOSIS — Z9989 Dependence on other enabling machines and devices: Secondary | ICD-10-CM

## 2018-07-15 DIAGNOSIS — Z789 Other specified health status: Secondary | ICD-10-CM

## 2018-07-15 DIAGNOSIS — Q211 Atrial septal defect, unspecified: Secondary | ICD-10-CM

## 2018-07-15 NOTE — Patient Instructions (Signed)
Medication Instructions:  Your physician recommends that you continue on your current medications as directed. Please refer to the Current Medication list given to you today.  If you need a refill on your cardiac medications before your next appointment, please call your pharmacy.   Lab work: NONE needed If you have labs (blood work) drawn today and your tests are completely normal, you will receive your results only by: Marland Kitchen MyChart Message (if you have MyChart) OR . A paper copy in the mail If you have any lab test that is abnormal or we need to change your treatment, we will call you to review the results.  Testing/Procedures: Dr. Debara Pickett has ordered a CT coronary calcium score. This test is done at [address]. This is $150 out of pocket.   Coronary CalciumScan A coronary calcium scan is an imaging test used to look for deposits of calcium and other fatty materials (plaques) in the inner lining of the blood vessels of the heart (coronary arteries). These deposits of calcium and plaques can partly clog and narrow the coronary arteries without producing any symptoms or warning signs. This puts a person at risk for a heart attack. This test can detect these deposits before symptoms develop. Tell a health care provider about:  Any allergies you have.  All medicines you are taking, including vitamins, herbs, eye drops, creams, and over-the-counter medicines.  Any problems you or family members have had with anesthetic medicines.  Any blood disorders you have.  Any surgeries you have had.  Any medical conditions you have.  Whether you are pregnant or may be pregnant. What are the risks? Generally, this is a safe procedure. However, problems may occur, including:  Harm to a pregnant woman and her unborn baby. This test involves the use of radiation. Radiation exposure can be dangerous to a pregnant woman and her unborn baby. If you are pregnant, you generally should  not have this procedure done.  Slight increase in the risk of cancer. This is because of the radiation involved in the test. What happens before the procedure? No preparation is needed for this procedure. What happens during the procedure?  You will undress and remove any jewelry around your neck or chest.  You will put on a hospital gown.  Sticky electrodes will be placed on your chest. The electrodes will be connected to an electrocardiogram (ECG) machine to record a tracing of the electrical activity of your heart.  A CT scanner will take pictures of your heart. During this time, you will be asked to lie still and hold your breath for 2-3 seconds while a picture of your heart is being taken. The procedure may vary among health care providers and hospitals. What happens after the procedure?  You can get dressed.  You can return to your normal activities.  It is up to you to get the results of your test. Ask your health care provider, or the department that is doing the test, when your results will be ready. Summary  A coronary calcium scan is an imaging test used to look for deposits of calcium and other fatty materials (plaques) in the inner lining of the blood vessels of the heart (coronary arteries).  Generally, this is a safe procedure. Tell your health care provider if you are pregnant or may be pregnant.  No preparation is needed for this procedure.  A CT scanner will take pictures of your heart.  You can return to your normal  activities after the scan is done. This information is not intended to replace advice given to you by your health care provider. Make sure you discuss any questions you have with your health care provider. Document Released: 07/04/2007 Document Revised: 11/25/2015 Document Reviewed: 11/25/2015 Elsevier Interactive Patient Education  2017 Elsevier Inc.    Follow-Up: Dr. Debara Pickett recommends that you schedule a follow up visit with him the in the  [place] in 4 months.

## 2018-07-15 NOTE — Progress Notes (Signed)
Virtual Visit via Video Note   This visit type was conducted due to national recommendations for restrictions regarding the COVID-19 Pandemic (e.g. social distancing) in an effort to limit this patient's exposure and mitigate transmission in our community.  Due to her co-morbid illnesses, this patient is at least at moderate risk for complications without adequate follow up.  This format is felt to be most appropriate for this patient at this time.  All issues noted in this document were discussed and addressed.  A limited physical exam was performed with this format.  Please refer to the patient's chart for her consent to telehealth for [facility].   Evaluation Performed:  Doxy.me video visit  Date:  07/15/2018   ID:  Diana [name], DOB 1955-02-20, MRN [id number]  Patient Location:  [place] [address]  Provider location:   [address], [address], [place] [address]  PCP:  Elby Showers, MD  Cardiologist:  Sherren Mocha, MD Electrophysiologist:  None   Chief Complaint:  No complaints  History of Present Illness:    Diana [name] is a 63 y.o. female who presents via audio/video conferencing for a telehealth visit today.  Diana [name] was seen today for lipid evaluation at the request of Dr. Renold Genta.  She is a pleasant 63 year old female who was remotely followed by Dr. Doreatha Lew and then Dr. Burt Knack for a small secundum atrial septal defect.  She has not had this repaired.  Other medical problems include morbid obesity, obstructive sleep apnea on CPAP and hypothyroidism.  She denies any significant history of hypertension, diabetes and no real significant coronary disease history.  Both of her parents she has had lived into their 1s.  He does have a history of dyslipidemia which is in her assessment primarily diet driven.  She said many years ago did her cholesterol was much lower about 150 for total cholesterol however it subsequently has risen with  significant weight gain.  She is also having problems ambulating and has an upcoming knee surgery scheduled.  She hopes to work on additional weight loss and dietary changes after that.  She says she is under a lot of stress and is a "stress eater".  She is trying a number of commercial diets.  Profile a month ago showed total cholesterol 253, HDL 40, triglycerides 223 and LDL of 175.  This represents mixed dyslipidemia and typical pattern for metabolic syndrome.  This is also an atherogenic lipid profile.  She has previously taken rosuvastatin which caused significant myalgias and recently was on pravastatin which she said caused sleep disturbance and was not tolerated.  She does not have any known coronary disease history.  She did have a vascular screen profile done a couple of years ago which showed some mild right internal carotid plaque.   The patient does not have symptoms concerning for COVID-19 infection (fever, chills, cough, or new SHORTNESS OF BREATH).    Prior CV studies:   The following studies were reviewed today:  Chart review Lab work Vascular screen  PMHx:  Past Medical History:  Diagnosis Date   Anxiety    controlled   Atrial septal defect    Small,non significant atrial septal defect with QP/QS of 1.25 by MRA.   Cancer [facility])    melanoma left leg [id number]   Complication of anesthesia    gets migraine when she has to be NPO   Depression    Hypothyroidism    Migraine  occasionally   Migraine headache    Obesity    Obstructive apnea    CPAP user since 2012.    PONV (postoperative nausea and vomiting)    n/v from migraine, not anesthesia   Pseudotumor cerebri Diagnosed in March 2008-   She sees Dr Clovis Riley in this regard   Statin intolerance    with elevated CRP    Past Surgical History:  Procedure Laterality Date   BACK SURGERY     BUNIONECTOMY     FRACTURE SURGERY     left 5th metatarsal   MASS EXCISION Left 03/22/2018   Procedure: EXCISION  CYST DEBRIDEMENT DISTAL INTERPHALANGEAL LEFT MIDDLE FINGER;  Surgeon: Daryll Brod, MD;  Location: [place];  Service: Orthopedics;  Laterality: Left;  FAB   ORTHOPEDIC SURGERY     lumbar surgery    PARTIAL KNEE ARTHROPLASTY Right 09/15/2016   Procedure: UNICOMPARTMENTAL KNEE;  Surgeon: Marchia Bond, MD;  Location: [place];  Service: Orthopedics;  Laterality: Right;   pin in the fifth metatarsal     right knee arthroscopy      FAMHx:  Family History  Problem Relation Age of Onset   Alzheimer's disease Mother    Rheumatic fever Father     SOCHx:   reports that she has never smoked. She has never used smokeless tobacco. She reports current alcohol use. She reports that she does not use drugs.  ALLERGIES:  Allergies  Allergen Reactions   Augmentin [Amoxicillin-Pot Clavulanate] Nausea And Vomiting    Has patient had a PCN reaction causing immediate rash, facial/tongue/throat swelling, SOB or lightheadedness with hypotension: No Has patient had a PCN reaction causing severe rash involving mucus membranes or skin necrosis: No Has patient had a PCN reaction that required hospitalization: No Has patient had a PCN reaction occurring within the last 10 years: #  #  #  YES  #  #  #  > 2014 If all of the above answers are "NO", then may proceed with Cephalosporin use.     MEDS:  Current Meds  Medication Sig   ALPRAZolam (XANAX) 0.5 MG tablet Take 0.5 mg by mouth daily as needed for anxiety.   aspirin EC 81 MG tablet Take 1 tablet (81 mg total) by mouth daily.   cetirizine (ZYRTEC) 10 MG tablet Take 10 mg by mouth daily.     levothyroxine (SYNTHROID, LEVOTHROID) 50 MCG tablet Take 1 tablet (50 mcg total) by mouth daily.   Multiple Vitamin (MULTI-VITAMIN DAILY) TABS Take by mouth See admin instructions. doterra multi - takes 2 tablets of 3 different multis for a total of 6 tabs   OVER THE COUNTER MEDICATION Take 1 tablet by mouth 3 (three) times daily as needed  (to prevent sickness). onguard   PRISTIQ 100 MG 24 hr tablet Take 100 mg by mouth daily.   SUMAtriptan (IMITREX) 100 MG tablet TAKE 1 TABLET BY MOUTH ONCE. MAY REPEAT IN 2 HOURS IF HEADACHE PERSISTS OR RECURS. MAX 2 TABLETS IN 24 HOURS     ROS: Pertinent items noted in HPI and remainder of comprehensive ROS otherwise negative.  Labs/Other Tests and Data Reviewed:    Recent Labs: 05/26/2018: ALT 23; BUN 21; Creat 0.93; Hemoglobin 14.1; Platelets 243; Potassium 4.0; Sodium 140; TSH 3.02   Recent Lipid Panel Lab Results  Component Value Date/Time   CHOL 253 (H) 05/26/2018 09:25 AM   TRIG 223 (H) 05/26/2018 09:25 AM   HDL 40 (L) 05/26/2018 09:25 AM   CHOLHDL 6.3 (  H) 05/26/2018 09:25 AM   LDLCALC 175 (H) 05/26/2018 09:25 AM    Wt Readings from Last 3 Encounters:  07/15/18 285 lb (129.3 kg)  06/02/18 265 lb (120.2 kg)  03/22/18 297 lb 9.9 oz (135 kg)     Exam:    Vital Signs:  BP 111/82    Pulse 85    Ht 5\' 9"  (1.753 m)    Wt 285 lb (129.3 kg)    BMI 42.09 kg/m    General appearance: alert, no distress and morbidly obese Lungs: No audible respiratory difficulty Abdomen: Morbidly obese Extremities: extremities normal, atraumatic, no cyanosis or edema Skin: Skin color, texture, turgor normal. No rashes or lesions Neurologic: Mental status: Alert, oriented, thought content appropriate Psych: Pleasant  ASSESSMENT & PLAN:    1. Mixed dyslipidemia 2. Statin intolerance 3. Morbid obesity 4. Mild carotid artery disease  Diana [name] has a mixed dyslipidemia with high LDL cholesterol.  I think this is primarily diet driven.  She has been eating more by her own account recently with weight gain and has been more sedentary due to a knee injury.  She will need significant dietary changes which we can help her with or possibly referral to Dr. Leafy Ro in the weight loss clinic.  She is scheduled for upcoming knee surgery.  I like to better understand her coronary risk and would recommend  a coronary artery calcium score.  If this is significantly elevated then aggressive therapy would be recommended, possibly PCSK9 inhibitor.  If it is 0 or low then most likely we could try an alternative statin, possibly ezetimibe, and would recommend aggressive lifestyle modification and weight loss.  Plan follow-up with me after that.  Thanks again for the kind referral.  COVID-19 Education: The signs and symptoms of COVID-19 were discussed with the patient and how to seek care for testing (follow up with PCP or arrange E-visit).  The importance of social distancing was discussed today.  Patient Risk:   After full review of this patients clinical status, I feel that they are at least moderate risk at this time.  Time:   Today, I have spent 25 minutes with the patient with telehealth technology discussing dyslipidemia, weight loss, dietary approaches to lipid management, carotid artery disease, statin intolerance.     Medication Adjustments/Labs and Tests Ordered: Current medicines are reviewed at length with the patient today.  Concerns regarding medicines are outlined above.   Tests Ordered: Orders Placed This Encounter  Procedures   CT CARDIAC SCORING    Medication Changes: No orders of the defined types were placed in this encounter.   Disposition:  in 4 month(s)  Pixie Casino, MD, [facility], [place] Director of the [facility]  Cardiovascular Risk Reduction Clinic Diplomate of the American Board of Clinical Lipidology Attending Cardiologist  Direct Dial: [phone]   Fax: [phone]  Website:  [email or web address]  Pixie Casino, MD  07/15/2018 9:18 AM

## 2018-07-20 ENCOUNTER — Ambulatory Visit
Admission: RE | Admit: 2018-07-20 | Discharge: 2018-07-20 | Disposition: A | Discharge status: Home / Self Care | Payer: PRIVATE HEALTH INSURANCE | Source: Ambulatory Visit | Attending: Internal Medicine | Admitting: Internal Medicine

## 2018-07-20 ENCOUNTER — Other Ambulatory Visit: Payer: Self-pay

## 2018-07-20 DIAGNOSIS — Z1231 Encounter for screening mammogram for malignant neoplasm of breast: Secondary | ICD-10-CM

## 2018-07-21 NOTE — Patient Instructions (Addendum)
YOU NEED TO HAVE A COVID 19 TEST ON__Friday 7/10 __7?11___ @____9 :15___, THIS TEST MUST BE DONE BEFORE SURGERY, COME TO [place] ENTRANCE. ONCE YOUR COVID TEST IS COMPLETED, PLEASE BEGIN THE QUARANTINE INSTRUCTIONS AS OUTLINED IN YOUR HANDOUT.                [name]    Your procedure is scheduled on: 08-02-2018   Report to [facility] Main  Entrance  Report to admitting at 8:00 AM   BRING CPAP [place]   Call this number if you have problems the morning of surgery [phone]    Remember:. BRUSH YOUR TEETH MORNING OF SURGERY AND RINSE YOUR MOUTH OUT, NO CHEWING GUM CANDY OR MINTS.   NO SOLID FOOD AFTER MIDNIGHT THE NIGHT PRIOR TO SURGERY.  NOTHING BY MOUTH EXCEPT CLEAR LIQUIDS UNTIL 7:30 AM . PLEASE FINISH ENSURE DRINK PER SURGEON ORDER 3 HOURS PRIOR TO SCHEDULED SURGERY TIME WHICH NEEDS TO BE COMPLETED AT 7:30 AM.  CLEAR LIQUID DIET   Foods Allowed                                                                     Foods Excluded  Coffee and tea, regular and decaf                             liquids that you cannot  Plain Jell-O in any flavor                                             see through such as: Fruit ices (not with fruit pulp)                                     milk, soups, orange juice  Iced Popsicles                                    All solid food Carbonated beverages, regular and diet                                    Cranberry, grape and apple juices Sports drinks like Gatorade Lightly seasoned clear broth or consume(fat free) Sugar, honey syrup  Sample Menu Breakfast                                Lunch                                     Supper Cranberry juice                    Beef broth  Chicken broth Jell-O                                     Grape juice                           Apple juice Coffee or tea                        Jell-O                                       Popsicle                                                Coffee or tea                        Coffee or tea  _____________________________________________________________________     Take these medicines the morning of surgery with A SIP OF WATER:  Synthroid, zyrtec, Pristiq                                You may not have any metal on your body including hair pins and              piercings             Do not wear jewelry, make-up, lotions, powders or perfumes, deodorant             Do not wear nail polish.  Do not shave  48 hours prior to surgery.             Do not bring valuables to the hospital. [place].  Contacts, dentures or bridgework may not be worn into surgery.      _____________________________________________________________________             [facility] - Preparing for Surgery Before surgery, you can play an important role.  Because skin is not sterile, your skin needs to be as free of germs as possible.  You can reduce the number of germs on your skin by washing with CHG (chlorahexidine gluconate) soap before surgery.  CHG is an antiseptic cleaner which kills germs and bonds with the skin to continue killing germs even after washing. Please DO NOT use if you have an allergy to CHG or antibacterial soaps.  If your skin becomes reddened/irritated stop using the CHG and inform your nurse when you arrive at Short Stay. Do not shave (including legs and underarms) for at least 48 hours prior to the first CHG shower.  You may shave your face/neck. Please follow these instructions carefully:  1.  Shower with CHG Soap the night before surgery and the  morning of Surgery.  2.  If you choose to wash your hair, wash your hair first as usual with your  normal  shampoo.  3.  After you shampoo, rinse your hair and body thoroughly to remove the  shampoo.                                     Marland Kitchen  Use CHG as you would any other liquid  soap.  You can apply chg directly  to the skin and wash                       Gently with a scrungie or clean washcloth.  5.  Apply the CHG Soap to your body ONLY FROM THE NECK DOWN.   Do not use on face/ open                           Wound or open sores. Avoid contact with eyes, ears mouth and genitals (private parts).                       Wash face,  Genitals (private parts) with your normal soap.             6.  Wash thoroughly, paying special attention to the area where your surgery  will be performed.  7.  Thoroughly rinse your body with warm water from the neck down.  8.  DO NOT shower/wash with your normal soap after using and rinsing off  the CHG Soap.                9.  Pat yourself dry with a clean towel.            10.  Wear clean pajamas.            11.  Place clean sheets on your bed the night of your first shower and do not  sleep with pets. Day of Surgery : Do not apply any lotions/deodorants the morning of surgery.  Please wear clean clothes to the hospital/surgery center.  FAILURE TO FOLLOW THESE INSTRUCTIONS MAY RESULT IN THE CANCELLATION OF YOUR SURGERY PATIENT SIGNATURE_________________________________  NURSE SIGNATURE__________________________________  ________________________________________________________________________   Adam Phenix  An incentive spirometer is a tool that can help keep your lungs clear and active. This tool measures how well you are filling your lungs with each breath. Taking long deep breaths may help reverse or decrease the chance of developing breathing (pulmonary) problems (especially infection) following:  A long period of time when you are unable to move or be active. BEFORE THE PROCEDURE   If the spirometer includes an indicator to show your best effort, your nurse or respiratory therapist will set it to a desired goal.  If possible, sit up straight or lean slightly forward. Try not to slouch.  Hold the incentive spirometer  in an upright position. INSTRUCTIONS FOR USE  1. Sit on the edge of your bed if possible, or sit up as far as you can in bed or on a chair. 2. Hold the incentive spirometer in an upright position. 3. Breathe out normally. 4. Place the mouthpiece in your mouth and seal your lips tightly around it. 5. Breathe in slowly and as deeply as possible, raising the piston or the ball toward the top of the column. 6. Hold your breath for 3-5 seconds or for as long as possible. Allow the piston or ball to fall to the bottom of the column. 7. Remove the mouthpiece from your mouth and breathe out normally. 8. Rest for a few seconds and repeat Steps 1 through 7 at least 10 times every 1-2 hours when you are awake. Take your time and take a few normal breaths between deep breaths. 9. The spirometer may include an indicator to show your  best effort. Use the indicator as a goal to work toward during each repetition. 10. After each set of 10 deep breaths, practice coughing to be sure your lungs are clear. If you have an incision (the cut made at the time of surgery), support your incision when coughing by placing a pillow or rolled up towels firmly against it. Once you are able to get out of bed, walk around indoors and cough well. You may stop using the incentive spirometer when instructed by your caregiver.  RISKS AND COMPLICATIONS  Take your time so you do not get dizzy or light-headed.  If you are in pain, you may need to take or ask for pain medication before doing incentive spirometry. It is harder to take a deep breath if you are having pain. AFTER USE  Rest and breathe slowly and easily.  It can be helpful to keep track of a log of your progress. Your caregiver can provide you with a simple table to help with this. If you are using the spirometer at home, follow these instructions: [place] IF:   You are having difficultly using the spirometer.  You have trouble using the spirometer as  often as instructed.  Your pain medication is not giving enough relief while using the spirometer.  You develop fever of 100.5 F (38.1 C) or higher. SEEK IMMEDIATE MEDICAL CARE IF:   You cough up bloody sputum that had not been present before.  You develop fever of 102 F (38.9 C) or greater.  You develop worsening pain at or near the incision site. MAKE SURE YOU:   Understand these instructions.  Will watch your condition.  Will get help right away if you are not doing well or get worse. Document Released: 05/18/2006 Document Revised: 03/30/2011 Document Reviewed: 07/19/2006 [facility] Patient Information 2014 [place], [place].   ________________________________________________________________________

## 2018-07-25 ENCOUNTER — Other Ambulatory Visit: Payer: Self-pay

## 2018-07-25 ENCOUNTER — Encounter (HOSPITAL_COMMUNITY): Payer: Self-pay

## 2018-07-25 ENCOUNTER — Encounter (HOSPITAL_COMMUNITY)
Admission: RE | Admit: 2018-07-25 | Discharge: 2018-07-25 | Disposition: A | Discharge status: Home / Self Care | Payer: PRIVATE HEALTH INSURANCE | Source: Ambulatory Visit | Attending: Orthopedic Surgery | Admitting: Orthopedic Surgery

## 2018-07-25 ENCOUNTER — Telehealth: Payer: Self-pay | Admitting: Cardiovascular Disease

## 2018-07-25 DIAGNOSIS — I10 Essential (primary) hypertension: Secondary | ICD-10-CM | POA: Insufficient documentation

## 2018-07-25 DIAGNOSIS — Z79890 Hormone replacement therapy (postmenopausal): Secondary | ICD-10-CM | POA: Insufficient documentation

## 2018-07-25 DIAGNOSIS — Z79899 Other long term (current) drug therapy: Secondary | ICD-10-CM | POA: Diagnosis not present

## 2018-07-25 DIAGNOSIS — F329 Major depressive disorder, single episode, unspecified: Secondary | ICD-10-CM | POA: Diagnosis not present

## 2018-07-25 DIAGNOSIS — M1712 Unilateral primary osteoarthritis, left knee: Secondary | ICD-10-CM | POA: Diagnosis not present

## 2018-07-25 DIAGNOSIS — Q211 Atrial septal defect: Secondary | ICD-10-CM | POA: Diagnosis not present

## 2018-07-25 DIAGNOSIS — Z85820 Personal history of malignant melanoma of skin: Secondary | ICD-10-CM | POA: Diagnosis not present

## 2018-07-25 DIAGNOSIS — Z6841 Body Mass Index (BMI) 40.0 and over, adult: Secondary | ICD-10-CM | POA: Diagnosis not present

## 2018-07-25 DIAGNOSIS — Z7982 Long term (current) use of aspirin: Secondary | ICD-10-CM | POA: Diagnosis not present

## 2018-07-25 DIAGNOSIS — F419 Anxiety disorder, unspecified: Secondary | ICD-10-CM | POA: Insufficient documentation

## 2018-07-25 DIAGNOSIS — G4733 Obstructive sleep apnea (adult) (pediatric): Secondary | ICD-10-CM | POA: Insufficient documentation

## 2018-07-25 DIAGNOSIS — E669 Obesity, unspecified: Secondary | ICD-10-CM | POA: Diagnosis not present

## 2018-07-25 DIAGNOSIS — E039 Hypothyroidism, unspecified: Secondary | ICD-10-CM | POA: Diagnosis not present

## 2018-07-25 DIAGNOSIS — Z01812 Encounter for preprocedural laboratory examination: Secondary | ICD-10-CM | POA: Diagnosis present

## 2018-07-25 HISTORY — DX: Essential (primary) hypertension: I10

## 2018-07-25 LAB — SURGICAL PCR SCREEN
MRSA, PCR: NEGATIVE
Staphylococcus aureus: NEGATIVE

## 2018-07-25 LAB — BASIC METABOLIC PANEL
Anion gap: 9 (ref 5–15)
BUN: 18 mg/dL (ref 8–23)
CO2: 25 mmol/L (ref 22–32)
Calcium: 9 mg/dL (ref 8.9–10.3)
Chloride: 105 mmol/L (ref 98–111)
Creatinine, Ser: 0.88 mg/dL (ref 0.44–1.00)
GFR calc Af Amer: >60 mL/min (ref >60)
GFR calc non Af Amer: >60 mL/min (ref >60)
Glucose, Bld: 107 mg/dL — ABNORMAL HIGH (ref 70–99)
Potassium: 4 mmol/L (ref 3.5–5.1)
Sodium: 139 mmol/L (ref 135–145)

## 2018-07-25 LAB — CBC
HCT: 41.5 % (ref 36.0–46.0)
Hemoglobin: 13.6 g/dL (ref 12.0–15.0)
MCH: 29.1 pg (ref 26.0–34.0)
MCHC: 32.8 g/dL (ref 30.0–36.0)
MCV: 88.9 fL (ref 80.0–100.0)
Platelets: 221 10*3/uL (ref 150–400)
RBC: 4.67 MIL/uL (ref 3.87–5.11)
RDW: 12.3 % (ref 11.5–15.5)
WBC: 6.9 10*3/uL (ref 4.0–10.5)
nRBC: 0 % (ref 0.0–0.2)

## 2018-07-25 NOTE — Progress Notes (Signed)
Konrad Felix PA  Patient denies having high blood pressure and is reluctant to take meds . She states that she has white coat syndrome.   Her diostolic is always high 36R-44R.BP at PAT was 155/93 at the end of the visit.  She has stopped her ASA 07/25/18  per Dr. Burt [facility] at [facility]. Her PCP is Dr. Tedra Senegal ECHO is in Epic and Labs are WNL with glucose slightly elevated

## 2018-07-25 NOTE — Telephone Encounter (Signed)
New message   Patient would like an order to set up an echo before her f/u visit. Please call.

## 2018-07-26 ENCOUNTER — Telehealth: Payer: Self-pay | Admitting: Internal Medicine

## 2018-07-26 NOTE — Progress Notes (Signed)
Anesthesia Chart Review   Case: [id number] Date/Time: 08/02/18 1018   Procedure: UNICOMPARTMENTAL KNEE (Left )   Anesthesia type: Choice   Pre-op diagnosis: djd left knee   Location: [place] / [facility]S   Surgeon: Marchia Bond, MD      DISCUSSION:62 y.o. never smoker with h/o PONV, OSA on CPAP, depression, hypothyroidism, HTN, small secundum atrial septal defect (asymptomatic), left knee DJD scheduled for above procedure 08/02/2018 with Dr. Marchia Bond.    Clearance received from PCP, Dr. Tedra Senegal, 06/02/2018 which states pt is optimized for surgery from a medical standpoint only.  "Referred 06/02/2018 to lipid clinic, has mixed hyperlipidemia and impaired glucose tolerance."  Cardiac clearance per telephone encounter 06/23/2018.  Per Kerin Ransom, PA-C, "She has had no issues since we last contacted her April- no chest pain, no unusual dyspnea. Given past medical history and time since last visit, based on ACC/AHA guidelines, Diana [name] would be at acceptable risk for the planned procedure without further cardiovascular testing."  Anticipate pt can proceed with planned procedure barring acute status change.   VS: BP (!) 155/92   Pulse 96   Temp 37.5 C (Oral)   Resp 18   Ht 5\' 9"  (1.753 m)   Wt 129.4 kg   SpO2 99%   BMI 42.14 kg/m   PROVIDERS: Elby Showers, MD is PCP   Sherren Mocha, MD is Cardiologist  LABS: Labs reviewed: Acceptable for surgery. (all labs ordered are listed, but only abnormal results are displayed)  Labs Reviewed  BASIC METABOLIC PANEL - Abnormal; Notable for the following components:      Result Value   Glucose, Bld 107 (*)    All other components within normal limits  SURGICAL PCR SCREEN  CBC     IMAGES:   EKG: 06/17/17 Rate 83 bpm Normal Sinus rhythm   CV: Echo 06/04/16 Study Conclusions  - Left ventricle: The cavity size was normal. Wall thickness was   normal. Systolic function was normal. The estimated ejection   fraction  was in the range of 60% to 65%. Left ventricular   diastolic function parameters were normal. - Left atrium: The atrium was mildly dilated. - Atrial septum: Poosible PFO If high concern for ASD or shunt   patient needs bubble study an or TEE. A patent foramen ovale   cannot be excluded. - Impressions: Thinning of the mid and basal atrial septum Possible   PFO  Impressions:  - Thinning of the mid and basal atrial septum Possible PFO Past Medical History:  Diagnosis Date  . Anxiety    controlled  . Atrial septal defect    Small,non significant atrial septal defect with QP/QS of 1.25 by MRA.  . Cancer ([place])    melanoma left leg 2012  . Complication of anesthesia    gets migraine when she has to be NPO  . Depression   . Hypertension    Pt doesn't take meds and denies having BP issues however her diostolic in alway in high 80s -90s  . Hypothyroidism   . Migraine    occasionally  . Migraine headache   . Obesity   . Obstructive apnea    CPAP user since 2012.   Marland Kitchen PONV (postoperative nausea and vomiting)    n/v from migraine, not anesthesia  . Pseudotumor cerebri Diagnosed in March 2008-   She sees Dr Clovis Riley in this regard  . Statin intolerance    with elevated CRP    Past Surgical  History:  Procedure Laterality Date  . BACK SURGERY    . BUNIONECTOMY    . FRACTURE SURGERY     left 5th metatarsal  . MASS EXCISION Left 03/22/2018   Procedure: EXCISION CYST DEBRIDEMENT DISTAL INTERPHALANGEAL LEFT MIDDLE FINGER;  Surgeon: Daryll Brod, MD;  Location: [place] [place];  Service: Orthopedics;  Laterality: Left;  FAB  . ORTHOPEDIC SURGERY     lumbar surgery   . PARTIAL KNEE ARTHROPLASTY Right 09/15/2016   Procedure: UNICOMPARTMENTAL KNEE;  Surgeon: Marchia Bond, MD;  Location: [place];  Service: Orthopedics;  Laterality: Right;  . pin in the fifth metatarsal    . right knee arthroscopy      MEDICATIONS: . ALPRAZolam (XANAX) 0.5 MG tablet  . aspirin EC 81 MG tablet  .  cetirizine (ZYRTEC) 10 MG tablet  . levothyroxine (SYNTHROID, LEVOTHROID) 50 MCG tablet  . Multiple Vitamin (MULTI-VITAMIN DAILY) TABS  . PRISTIQ 100 MG 24 hr tablet  . SUMAtriptan (IMITREX) 100 MG tablet   No current facility-administered medications for this encounter.     Maia Plan [facility] Pre-Surgical Testing [phone] 07/28/18  12:07 PM

## 2018-07-26 NOTE — Telephone Encounter (Signed)
New message:    Janett Billow said Dr Debara Pickett had order a CT Calcium Scoring. She wants to know if pt needs this before her surgery?

## 2018-07-26 NOTE — Telephone Encounter (Signed)
Spoke with Janett Billow from [place] who reports patient has clearance from Dr. Burt Knack for knee surgery on 7/14 but Dr. Lysbeth Penner 6/26 lipid clinic note states "She is scheduled for upcoming knee surgery.  I like to better understand her coronary risk and would recommend a coronary artery calcium score." She is calling to see if Dr. Debara Pickett wanted her to have calcium score prior to surgery or it the test was ordered purely to assess CV risk in terms of cholesterol management. Her test is scheduled for 7/24  Routed to MD to advise

## 2018-07-27 NOTE — Telephone Encounter (Signed)
Scheduled the patient for follow-up in October. She understands her echo will be done every 3 years. She was grateful for assistance.

## 2018-07-27 NOTE — Telephone Encounter (Signed)
Calcium score just to assess risk - not preop evaluation. If cleared by Burt Knack for surgery, I'm ok with this.  Dr. Lemmie Evens

## 2018-07-28 NOTE — Anesthesia Preprocedure Evaluation (Addendum)
Anesthesia Evaluation  Patient identified by MRN, date of birth, ID band Patient awake    Reviewed: Allergy & Precautions, NPO status , Patient's Chart, lab work & pertinent test results  History of Anesthesia Complications (+) PONV  Airway Mallampati: II  TM Distance: >3 FB Neck ROM: Full    Dental no notable dental hx.    Pulmonary sleep apnea ,    Pulmonary exam normal breath sounds clear to auscultation       Cardiovascular hypertension, Pt. on medications negative cardio ROS Normal cardiovascular exam Rhythm:Regular Rate:Normal     Neuro/Psych  Headaches, Anxiety Depression negative psych ROS   GI/Hepatic negative GI ROS, Neg liver ROS,   Endo/Other  Hypothyroidism   Renal/GU negative Renal ROS  negative genitourinary   Musculoskeletal negative musculoskeletal ROS (+)   Abdominal (+) + obese,   Peds negative pediatric ROS (+)  Hematology negative hematology ROS (+)   Anesthesia Other Findings   Reproductive/Obstetrics negative OB ROS                            Anesthesia Physical Anesthesia Plan  ASA: II  Anesthesia Plan: Spinal   Post-op Pain Management:  Regional for Post-op pain   Induction: Intravenous  PONV Risk Score and Plan: 3 and Ondansetron, Dexamethasone, Midazolam and Treatment may vary due to age or medical condition  Airway Management Planned: Simple Face Mask  Additional Equipment:   Intra-op Plan:   Post-operative Plan: Extubation in OR  Informed Consent: I have reviewed the patients History and Physical, chart, labs and discussed the procedure including the risks, benefits and alternatives for the proposed anesthesia with the patient or authorized representative who has indicated his/her understanding and acceptance.     Dental advisory given  Plan Discussed with: CRNA  Anesthesia Plan Comments: (See PAT note 07/25/2018, Konrad Felix, PA-C)        Anesthesia Quick Evaluation

## 2018-07-28 NOTE — Telephone Encounter (Signed)
LM for Diana [name] with [facility] anesthesia on name-verified VM with MD info noted below

## 2018-07-30 ENCOUNTER — Other Ambulatory Visit (HOSPITAL_COMMUNITY)
Admission: RE | Admit: 2018-07-30 | Discharge: 2018-07-30 | Disposition: A | Discharge status: Home / Self Care | Payer: PRIVATE HEALTH INSURANCE | Source: Ambulatory Visit | Attending: Orthopedic Surgery | Admitting: Orthopedic Surgery

## 2018-07-30 DIAGNOSIS — Z1159 Encounter for screening for other viral diseases: Secondary | ICD-10-CM | POA: Insufficient documentation

## 2018-07-30 DIAGNOSIS — Z01812 Encounter for preprocedural laboratory examination: Secondary | ICD-10-CM | POA: Insufficient documentation

## 2018-07-30 LAB — SARS CORONAVIRUS 2 (TAT 6-24 HRS): SARS Coronavirus 2: NEGATIVE

## 2018-08-01 MED ORDER — DEXTROSE 5 % IV SOLN
3.0000 g | INTRAVENOUS | Status: AC
  Administered 2018-08-02: 3 g via INTRAVENOUS
  Filled 2018-08-01: qty 3

## 2018-08-02 ENCOUNTER — Observation Stay (HOSPITAL_COMMUNITY)
Admission: RE | Admit: 2018-08-02 | Discharge: 2018-08-03 | Disposition: A | Discharge status: Home / Self Care | Payer: PRIVATE HEALTH INSURANCE | Source: Ambulatory Visit | Attending: Orthopedic Surgery | Admitting: Orthopedic Surgery

## 2018-08-02 ENCOUNTER — Ambulatory Visit (HOSPITAL_COMMUNITY): Payer: PRIVATE HEALTH INSURANCE | Admitting: Certified Registered"

## 2018-08-02 ENCOUNTER — Observation Stay (HOSPITAL_COMMUNITY): Payer: PRIVATE HEALTH INSURANCE

## 2018-08-02 ENCOUNTER — Other Ambulatory Visit: Payer: Self-pay

## 2018-08-02 ENCOUNTER — Encounter (HOSPITAL_COMMUNITY)
Admission: RE | Disposition: A | Discharge status: Home / Self Care | Payer: Self-pay | Source: Ambulatory Visit | Attending: Orthopedic Surgery

## 2018-08-02 ENCOUNTER — Encounter (HOSPITAL_COMMUNITY): Payer: Self-pay | Admitting: *Deleted

## 2018-08-02 ENCOUNTER — Ambulatory Visit (HOSPITAL_COMMUNITY): Payer: PRIVATE HEALTH INSURANCE | Admitting: Physician Assistant

## 2018-08-02 DIAGNOSIS — F419 Anxiety disorder, unspecified: Secondary | ICD-10-CM | POA: Diagnosis not present

## 2018-08-02 DIAGNOSIS — Z6841 Body Mass Index (BMI) 40.0 and over, adult: Secondary | ICD-10-CM | POA: Diagnosis not present

## 2018-08-02 DIAGNOSIS — I10 Essential (primary) hypertension: Secondary | ICD-10-CM | POA: Insufficient documentation

## 2018-08-02 DIAGNOSIS — G43909 Migraine, unspecified, not intractable, without status migrainosus: Secondary | ICD-10-CM | POA: Insufficient documentation

## 2018-08-02 DIAGNOSIS — G932 Benign intracranial hypertension: Secondary | ICD-10-CM | POA: Insufficient documentation

## 2018-08-02 DIAGNOSIS — E669 Obesity, unspecified: Secondary | ICD-10-CM | POA: Insufficient documentation

## 2018-08-02 DIAGNOSIS — Z7982 Long term (current) use of aspirin: Secondary | ICD-10-CM | POA: Diagnosis not present

## 2018-08-02 DIAGNOSIS — F329 Major depressive disorder, single episode, unspecified: Secondary | ICD-10-CM | POA: Insufficient documentation

## 2018-08-02 DIAGNOSIS — M1712 Unilateral primary osteoarthritis, left knee: Secondary | ICD-10-CM | POA: Diagnosis present

## 2018-08-02 DIAGNOSIS — Z85820 Personal history of malignant melanoma of skin: Secondary | ICD-10-CM | POA: Insufficient documentation

## 2018-08-02 DIAGNOSIS — Z79899 Other long term (current) drug therapy: Secondary | ICD-10-CM | POA: Insufficient documentation

## 2018-08-02 DIAGNOSIS — Z96652 Presence of left artificial knee joint: Secondary | ICD-10-CM

## 2018-08-02 DIAGNOSIS — Z96651 Presence of right artificial knee joint: Secondary | ICD-10-CM | POA: Diagnosis not present

## 2018-08-02 DIAGNOSIS — Z79890 Hormone replacement therapy (postmenopausal): Secondary | ICD-10-CM | POA: Diagnosis not present

## 2018-08-02 DIAGNOSIS — G4733 Obstructive sleep apnea (adult) (pediatric): Secondary | ICD-10-CM | POA: Diagnosis not present

## 2018-08-02 DIAGNOSIS — E039 Hypothyroidism, unspecified: Secondary | ICD-10-CM | POA: Diagnosis not present

## 2018-08-02 HISTORY — PX: PARTIAL KNEE ARTHROPLASTY: SHX2174

## 2018-08-02 HISTORY — DX: Unilateral primary osteoarthritis, left knee: M17.12

## 2018-08-02 SURGERY — ARTHROPLASTY, KNEE, UNICOMPARTMENTAL
Anesthesia: Spinal | Site: Knee | Laterality: Left

## 2018-08-02 MED ORDER — LACTATED RINGERS IV SOLN
INTRAVENOUS | Status: DC
  Administered 2018-08-02 (×2): via INTRAVENOUS

## 2018-08-02 MED ORDER — MENTHOL 3 MG MT LOZG: 1.0000 | LOZENGE | OROMUCOSAL | Status: DC | PRN

## 2018-08-02 MED ORDER — CHLORHEXIDINE GLUCONATE 4 % EX LIQD: 60.0000 mL | Freq: Once | CUTANEOUS | Status: DC

## 2018-08-02 MED ORDER — LIDOCAINE 2% (20 MG/ML) 5 ML SYRINGE
INTRAMUSCULAR | Status: DC | PRN
  Administered 2018-08-02: 50 mg via INTRAVENOUS

## 2018-08-02 MED ORDER — MAGNESIUM CITRATE PO SOLN: 1.0000 | Freq: Once | ORAL | Status: DC | PRN

## 2018-08-02 MED ORDER — MIDAZOLAM HCL 2 MG/2ML IJ SOLN
INTRAMUSCULAR | Status: AC
  Filled 2018-08-02: qty 2

## 2018-08-02 MED ORDER — METOCLOPRAMIDE HCL 5 MG PO TABS: 5.0000 mg | ORAL_TABLET | Freq: Three times a day (TID) | ORAL | Status: DC | PRN

## 2018-08-02 MED ORDER — HYDROCODONE-ACETAMINOPHEN 10-325 MG PO TABS: 1.0000 | ORAL_TABLET | Freq: Four times a day (QID) | ORAL | 0 refills | Status: DC | PRN

## 2018-08-02 MED ORDER — METHOCARBAMOL 500 MG PO TABS
500.0000 mg | ORAL_TABLET | Freq: Four times a day (QID) | ORAL | Status: DC | PRN
  Administered 2018-08-02 – 2018-08-03 (×2): 500 mg via ORAL
  Filled 2018-08-02 (×2): qty 1

## 2018-08-02 MED ORDER — ACETAMINOPHEN 500 MG PO TABS
500.0000 mg | ORAL_TABLET | Freq: Four times a day (QID) | ORAL | Status: AC
  Administered 2018-08-02 – 2018-08-03 (×4): 500 mg via ORAL
  Filled 2018-08-02 (×4): qty 1

## 2018-08-02 MED ORDER — KETOROLAC TROMETHAMINE 30 MG/ML IJ SOLN
INTRAMUSCULAR | Status: DC | PRN
  Administered 2018-08-02: 30 mg via INTRA_ARTICULAR

## 2018-08-02 MED ORDER — BACLOFEN 10 MG PO TABS: 10.0000 mg | ORAL_TABLET | Freq: Three times a day (TID) | ORAL | 0 refills | Status: DC

## 2018-08-02 MED ORDER — DEXAMETHASONE SODIUM PHOSPHATE 10 MG/ML IJ SOLN
10.0000 mg | Freq: Once | INTRAMUSCULAR | Status: AC
  Administered 2018-08-03: 09:00:00 10 mg via INTRAVENOUS
  Filled 2018-08-02: qty 1

## 2018-08-02 MED ORDER — FENTANYL CITRATE (PF) 100 MCG/2ML IJ SOLN
INTRAMUSCULAR | Status: DC | PRN
  Administered 2018-08-02 (×2): 25 ug via INTRAVENOUS
  Administered 2018-08-02: 50 ug via INTRAVENOUS

## 2018-08-02 MED ORDER — ASPIRIN EC 325 MG PO TBEC
325.0000 mg | DELAYED_RELEASE_TABLET | Freq: Two times a day (BID) | ORAL | Status: DC
  Administered 2018-08-02 – 2018-08-03 (×2): 325 mg via ORAL
  Filled 2018-08-02 (×2): qty 1

## 2018-08-02 MED ORDER — TRANEXAMIC ACID-NACL 1000-0.7 MG/100ML-% IV SOLN
INTRAVENOUS | Status: AC
  Filled 2018-08-02: qty 100

## 2018-08-02 MED ORDER — MIDAZOLAM HCL 5 MG/5ML IJ SOLN
INTRAMUSCULAR | Status: DC | PRN
  Administered 2018-08-02: 2 mg via INTRAVENOUS

## 2018-08-02 MED ORDER — ONDANSETRON HCL 4 MG/2ML IJ SOLN: 4.0000 mg | Freq: Four times a day (QID) | INTRAMUSCULAR | Status: DC | PRN

## 2018-08-02 MED ORDER — LEVOTHYROXINE SODIUM 50 MCG PO TABS
50.0000 ug | ORAL_TABLET | Freq: Every day | ORAL | Status: DC
  Administered 2018-08-03: 05:00:00 50 ug via ORAL
  Filled 2018-08-02: qty 1

## 2018-08-02 MED ORDER — ZOLPIDEM TARTRATE 5 MG PO TABS: 5.0000 mg | ORAL_TABLET | Freq: Every evening | ORAL | Status: DC | PRN

## 2018-08-02 MED ORDER — ASPIRIN EC 325 MG PO TBEC: 325.0000 mg | DELAYED_RELEASE_TABLET | Freq: Two times a day (BID) | ORAL | 0 refills | Status: DC

## 2018-08-02 MED ORDER — DOCUSATE SODIUM 100 MG PO CAPS
100.0000 mg | ORAL_CAPSULE | Freq: Two times a day (BID) | ORAL | Status: DC
  Filled 2018-08-02 (×2): qty 1

## 2018-08-02 MED ORDER — MIDAZOLAM HCL 2 MG/2ML IJ SOLN
1.0000 mg | Freq: Once | INTRAMUSCULAR | Status: AC
  Administered 2018-08-02: 2 mg via INTRAVENOUS
  Filled 2018-08-02: qty 2

## 2018-08-02 MED ORDER — PROMETHAZINE HCL 25 MG/ML IJ SOLN: 6.2500 mg | INTRAMUSCULAR | Status: DC | PRN

## 2018-08-02 MED ORDER — OXYCODONE HCL 5 MG/5ML PO SOLN: 5.0000 mg | Freq: Once | ORAL | Status: DC | PRN

## 2018-08-02 MED ORDER — KETOROLAC TROMETHAMINE 30 MG/ML IJ SOLN
INTRAMUSCULAR | Status: AC
  Filled 2018-08-02: qty 1

## 2018-08-02 MED ORDER — HYDROCODONE-ACETAMINOPHEN 5-325 MG PO TABS: 1.0000 | ORAL_TABLET | ORAL | Status: DC | PRN

## 2018-08-02 MED ORDER — VENLAFAXINE HCL ER 150 MG PO CP24
150.0000 mg | ORAL_CAPSULE | Freq: Every day | ORAL | Status: DC
  Filled 2018-08-02: qty 1

## 2018-08-02 MED ORDER — POTASSIUM CHLORIDE IN NACL 20-0.45 MEQ/L-% IV SOLN
INTRAVENOUS | Status: DC
  Filled 2018-08-02 (×2): qty 1000

## 2018-08-02 MED ORDER — ALPRAZOLAM 0.5 MG PO TABS: 0.5000 mg | ORAL_TABLET | Freq: Every day | ORAL | Status: DC | PRN

## 2018-08-02 MED ORDER — POLYETHYLENE GLYCOL 3350 17 G PO PACK: 17.0000 g | PACK | Freq: Every day | ORAL | Status: DC | PRN

## 2018-08-02 MED ORDER — PROPOFOL 10 MG/ML IV BOLUS
INTRAVENOUS | Status: AC
  Filled 2018-08-02: qty 20

## 2018-08-02 MED ORDER — METOCLOPRAMIDE HCL 5 MG/ML IJ SOLN: 5.0000 mg | Freq: Three times a day (TID) | INTRAMUSCULAR | Status: DC | PRN

## 2018-08-02 MED ORDER — ROPIVACAINE HCL 5 MG/ML IJ SOLN
INTRAMUSCULAR | Status: DC | PRN
  Administered 2018-08-02: 20 mL via PERINEURAL

## 2018-08-02 MED ORDER — FENTANYL CITRATE (PF) 100 MCG/2ML IJ SOLN
50.0000 ug | Freq: Once | INTRAMUSCULAR | Status: AC
  Administered 2018-08-02: 09:00:00 100 ug via INTRAVENOUS
  Filled 2018-08-02: qty 2

## 2018-08-02 MED ORDER — ONDANSETRON HCL 4 MG/2ML IJ SOLN
INTRAMUSCULAR | Status: DC | PRN
  Administered 2018-08-02: 4 mg via INTRAVENOUS

## 2018-08-02 MED ORDER — BUPIVACAINE IN DEXTROSE 0.75-8.25 % IT SOLN
INTRATHECAL | Status: DC | PRN
  Administered 2018-08-02: 1.6 mL via INTRATHECAL

## 2018-08-02 MED ORDER — SENNA-DOCUSATE SODIUM 8.6-50 MG PO TABS: 2.0000 | ORAL_TABLET | Freq: Every day | ORAL | 1 refills | Status: DC

## 2018-08-02 MED ORDER — LORATADINE 10 MG PO TABS
10.0000 mg | ORAL_TABLET | Freq: Every day | ORAL | Status: DC
  Administered 2018-08-02: 15:00:00 10 mg via ORAL
  Filled 2018-08-02 (×2): qty 1

## 2018-08-02 MED ORDER — PHENYLEPHRINE 40 MCG/ML (10ML) SYRINGE FOR IV PUSH (FOR BLOOD PRESSURE SUPPORT)
PREFILLED_SYRINGE | INTRAVENOUS | Status: AC
  Filled 2018-08-02: qty 10

## 2018-08-02 MED ORDER — LIDOCAINE 2% (20 MG/ML) 5 ML SYRINGE
INTRAMUSCULAR | Status: AC
  Filled 2018-08-02: qty 5

## 2018-08-02 MED ORDER — HYDROMORPHONE HCL 1 MG/ML IJ SOLN: 0.2500 mg | INTRAMUSCULAR | Status: DC | PRN

## 2018-08-02 MED ORDER — CEFAZOLIN SODIUM-DEXTROSE 2-4 GM/100ML-% IV SOLN
2.0000 g | Freq: Four times a day (QID) | INTRAVENOUS | Status: AC
  Administered 2018-08-02 (×2): 2 g via INTRAVENOUS
  Filled 2018-08-02 (×2): qty 100

## 2018-08-02 MED ORDER — DIPHENHYDRAMINE HCL 12.5 MG/5ML PO ELIX: 12.5000 mg | ORAL_SOLUTION | ORAL | Status: DC | PRN

## 2018-08-02 MED ORDER — TRANEXAMIC ACID-NACL 1000-0.7 MG/100ML-% IV SOLN
1000.0000 mg | Freq: Once | INTRAVENOUS | Status: AC
  Administered 2018-08-02: 16:00:00 1000 mg via INTRAVENOUS
  Filled 2018-08-02: qty 100

## 2018-08-02 MED ORDER — ALUM & MAG HYDROXIDE-SIMETH 200-200-20 MG/5ML PO SUSP: 30.0000 mL | ORAL | Status: DC | PRN

## 2018-08-02 MED ORDER — HYDROCODONE-ACETAMINOPHEN 7.5-325 MG PO TABS: 1.0000 | ORAL_TABLET | ORAL | Status: DC | PRN

## 2018-08-02 MED ORDER — POVIDONE-IODINE 10 % EX SWAB
2.0000 "application " | Freq: Once | CUTANEOUS | Status: AC
  Administered 2018-08-02: 2 via TOPICAL

## 2018-08-02 MED ORDER — ACETAMINOPHEN 325 MG PO TABS: 325.0000 mg | ORAL_TABLET | Freq: Four times a day (QID) | ORAL | Status: DC | PRN

## 2018-08-02 MED ORDER — ONDANSETRON HCL 4 MG/2ML IJ SOLN
INTRAMUSCULAR | Status: AC
  Filled 2018-08-02: qty 2

## 2018-08-02 MED ORDER — FENTANYL CITRATE (PF) 100 MCG/2ML IJ SOLN
INTRAMUSCULAR | Status: AC
  Filled 2018-08-02: qty 2

## 2018-08-02 MED ORDER — OXYCODONE HCL 5 MG PO TABS: 5.0000 mg | ORAL_TABLET | Freq: Once | ORAL | Status: DC | PRN

## 2018-08-02 MED ORDER — ONDANSETRON HCL 4 MG PO TABS: 4.0000 mg | ORAL_TABLET | Freq: Four times a day (QID) | ORAL | Status: DC | PRN

## 2018-08-02 MED ORDER — ONDANSETRON HCL 4 MG PO TABS: 4.0000 mg | ORAL_TABLET | Freq: Three times a day (TID) | ORAL | 0 refills | Status: DC | PRN

## 2018-08-02 MED ORDER — MORPHINE SULFATE (PF) 2 MG/ML IV SOLN: 0.5000 mg | INTRAVENOUS | Status: DC | PRN

## 2018-08-02 MED ORDER — PROPOFOL 500 MG/50ML IV EMUL
INTRAVENOUS | Status: DC | PRN
  Administered 2018-08-02: 75 ug/kg/min via INTRAVENOUS

## 2018-08-02 MED ORDER — SUMATRIPTAN SUCCINATE 50 MG PO TABS
100.0000 mg | ORAL_TABLET | ORAL | Status: DC | PRN
  Filled 2018-08-02: qty 2

## 2018-08-02 MED ORDER — TRANEXAMIC ACID-NACL 1000-0.7 MG/100ML-% IV SOLN
1000.0000 mg | INTRAVENOUS | Status: AC
  Administered 2018-08-02: 1000 mg via INTRAVENOUS

## 2018-08-02 MED ORDER — BUPIVACAINE HCL 0.25 % IJ SOLN
INTRAMUSCULAR | Status: AC
  Filled 2018-08-02: qty 1

## 2018-08-02 MED ORDER — SODIUM CHLORIDE 0.9 % IR SOLN
Status: DC | PRN
  Administered 2018-08-02: 1000 mL

## 2018-08-02 MED ORDER — BUPIVACAINE HCL 0.25 % IJ SOLN
INTRAMUSCULAR | Status: DC | PRN
  Administered 2018-08-02: 30 mL

## 2018-08-02 MED ORDER — KETOROLAC TROMETHAMINE 15 MG/ML IJ SOLN
7.5000 mg | Freq: Four times a day (QID) | INTRAMUSCULAR | Status: AC
  Administered 2018-08-02 – 2018-08-03 (×4): 7.5 mg via INTRAVENOUS
  Filled 2018-08-02 (×4): qty 1

## 2018-08-02 MED ORDER — BISACODYL 10 MG RE SUPP: 10.0000 mg | Freq: Every day | RECTAL | Status: DC | PRN

## 2018-08-02 MED ORDER — DEXAMETHASONE SODIUM PHOSPHATE 10 MG/ML IJ SOLN
INTRAMUSCULAR | Status: DC | PRN
  Administered 2018-08-02: 10 mg via INTRAVENOUS

## 2018-08-02 MED ORDER — METHOCARBAMOL 500 MG IVPB - SIMPLE MED
500.0000 mg | Freq: Four times a day (QID) | INTRAVENOUS | Status: DC | PRN
  Filled 2018-08-02 (×2): qty 50

## 2018-08-02 MED ORDER — STERILE WATER FOR IRRIGATION IR SOLN
Status: DC | PRN
  Administered 2018-08-02: 2000 mL

## 2018-08-02 MED ORDER — PHENOL 1.4 % MT LIQD: 1.0000 | OROMUCOSAL | Status: DC | PRN

## 2018-08-02 MED ORDER — 0.9 % SODIUM CHLORIDE (POUR BTL) OPTIME
TOPICAL | Status: DC | PRN
  Administered 2018-08-02: 1000 mL

## 2018-08-02 MED ORDER — DEXAMETHASONE SODIUM PHOSPHATE 10 MG/ML IJ SOLN
INTRAMUSCULAR | Status: AC
  Filled 2018-08-02: qty 1

## 2018-08-02 SURGICAL SUPPLY — 69 items
BAG SPEC THK2 15X12 ZIP CLS (MISCELLANEOUS) ×1
BAG ZIPLOCK 12X15 (MISCELLANEOUS) ×3 IMPLANT
BANDAGE ESMARK 6X9 LF (GAUZE/BANDAGES/DRESSINGS) ×1 IMPLANT
BEARING MENISCAL TIBIAL 7 MD L (Knees) ×2 IMPLANT
BLADE SURG 15 STRL LF DISP TIS (BLADE) ×1 IMPLANT
BLADE SURG 15 STRL SS (BLADE) ×3
BNDG CMPR 9X6 STRL LF SNTH (GAUZE/BANDAGES/DRESSINGS) ×1
BNDG CMPR MED 15X6 ELC VLCR LF (GAUZE/BANDAGES/DRESSINGS) ×1
BNDG ELASTIC 6X15 VLCR STRL LF (GAUZE/BANDAGES/DRESSINGS) ×3 IMPLANT
BNDG ESMARK 6X9 LF (GAUZE/BANDAGES/DRESSINGS) ×3
BOWL SMART MIX CTS (DISPOSABLE) ×3 IMPLANT
BRNG TIB MED 7 PHS 3 UNCMP (Knees) ×1 IMPLANT
CEMENT BONE R 1X40 (Cement) ×3 IMPLANT
CLOSURE STERI-STRIP 1/2X4 (GAUZE/BANDAGES/DRESSINGS) ×1
CLOSURE WOUND 1/2 X4 (GAUZE/BANDAGES/DRESSINGS) ×1
CLSR STERI-STRIP ANTIMIC 1/2X4 (GAUZE/BANDAGES/DRESSINGS) ×2 IMPLANT
COVER SURGICAL LIGHT HANDLE (MISCELLANEOUS) ×3 IMPLANT
COVER WAND RF STERILE (DRAPES) IMPLANT
CUFF TOURN SGL QUICK 34 (TOURNIQUET CUFF) ×3
CUFF TRNQT CYL 34X4.125X (TOURNIQUET CUFF) ×1 IMPLANT
DECANTER SPIKE VIAL GLASS SM (MISCELLANEOUS) IMPLANT
DRAPE POUCH INSTRU U-SHP 10X18 (DRAPES) ×3 IMPLANT
DRAPE SHEET LG 3/4 BI-LAMINATE (DRAPES) ×3 IMPLANT
DRAPE U-SHAPE 47X51 STRL (DRAPES) ×3 IMPLANT
DRSG MEPILEX BORDER 4X8 (GAUZE/BANDAGES/DRESSINGS) ×3 IMPLANT
DRSG PAD ABDOMINAL 8X10 ST (GAUZE/BANDAGES/DRESSINGS) ×3 IMPLANT
DURAPREP 26ML APPLICATOR (WOUND CARE) ×6 IMPLANT
ELECT REM PT RETURN 15FT ADLT (MISCELLANEOUS) ×3 IMPLANT
FACESHIELD WRAPAROUND (MASK) ×3 IMPLANT
FACESHIELD WRAPAROUND OR TEAM (MASK) ×1 IMPLANT
GLOVE BIO SURGEON STRL SZ7.5 (GLOVE) ×3 IMPLANT
GLOVE BIOGEL PI IND STRL 8 (GLOVE) ×2 IMPLANT
GLOVE BIOGEL PI INDICATOR 8 (GLOVE) ×4
GLOVE SURG ORTHO 8.0 STRL STRW (GLOVE) ×3 IMPLANT
GOWN STRL REUS W/TWL 2XL LVL3 (GOWN DISPOSABLE) ×3 IMPLANT
GOWN STRL REUS W/TWL LRG LVL3 (GOWN DISPOSABLE) ×3 IMPLANT
HANDPIECE INTERPULSE COAX TIP (DISPOSABLE) ×3
HOLDER FOLEY CATH W/STRAP (MISCELLANEOUS) IMPLANT
HOOD PEEL AWAY FLYTE STAYCOOL (MISCELLANEOUS) ×6 IMPLANT
IMMOBILIZER KNEE 20 (SOFTGOODS) ×3
IMMOBILIZER KNEE 20 THIGH 36 (SOFTGOODS) IMPLANT
IMMOBILIZER KNEE 22 UNIV (SOFTGOODS) ×3 IMPLANT
KIT BASIN OR (CUSTOM PROCEDURE TRAY) ×3 IMPLANT
KIT TURNOVER KIT A (KITS) IMPLANT
NDL SAFETY ECLIPSE 18X1.5 (NEEDLE) ×1 IMPLANT
NEEDLE HYPO 18GX1.5 SHARP (NEEDLE) ×3
NS IRRIG 1000ML POUR BTL (IV SOLUTION) ×3 IMPLANT
PACK BLADE SAW RECIP 70 3 PT (BLADE) ×2 IMPLANT
PACK ICE MAXI GEL EZY WRAP (MISCELLANEOUS) ×3 IMPLANT
PACK TOTAL JOINT (CUSTOM PROCEDURE TRAY) ×3 IMPLANT
PEG TWIN FEM CEMENTED MED (Knees) ×2 IMPLANT
PROTECTOR NERVE ULNAR (MISCELLANEOUS) ×3 IMPLANT
SET HNDPC FAN SPRY TIP SCT (DISPOSABLE) ×1 IMPLANT
STRIP CLOSURE SKIN 1/2X4 (GAUZE/BANDAGES/DRESSINGS) ×1 IMPLANT
SUCTION FRAZIER HANDLE 12FR (TUBING) ×2
SUCTION TUBE FRAZIER 12FR DISP (TUBING) ×1 IMPLANT
SUT VIC AB 0 CT1 36 (SUTURE) ×3 IMPLANT
SUT VIC AB 2-0 CT1 27 (SUTURE) ×3
SUT VIC AB 2-0 CT1 TAPERPNT 27 (SUTURE) ×1 IMPLANT
SUT VIC AB 3-0 SH 8-18 (SUTURE) ×3 IMPLANT
SYR 20CC LL (SYRINGE) ×3 IMPLANT
SYR 30ML LL (SYRINGE) ×3 IMPLANT
SYR 3ML LL SCALE MARK (SYRINGE) ×3 IMPLANT
TOWEL OR 17X26 10 PK STRL BLUE (TOWEL DISPOSABLE) ×3 IMPLANT
TOWEL OR NON WOVEN STRL DISP B (DISPOSABLE) ×3 IMPLANT
TRAY FOLEY MTR SLVR 16FR STAT (SET/KITS/TRAYS/PACK) ×3 IMPLANT
TRAY TIBIAL OXFORD SZ D LF (Joint) ×2 IMPLANT
WATER STERILE IRR 1000ML POUR (IV SOLUTION) ×3 IMPLANT
WRAP KNEE MAXI GEL POST OP (GAUZE/BANDAGES/DRESSINGS) ×2 IMPLANT

## 2018-08-02 NOTE — Discharge Instructions (Signed)

## 2018-08-02 NOTE — Op Note (Signed)
08/02/2018  12:31 PM  PATIENT:  Diana [name]    PRE-OPERATIVE DIAGNOSIS: Left knee severe anteromedial osteoarthritis  POST-OPERATIVE DIAGNOSIS:  Same  PROCEDURE: LEFT unicompartmental Knee Arthroplasty  SURGEON:  Johnny Bridge, MD  PHYSICIAN ASSISTANT: Joya Gaskins, OPA-C, present and scrubbed throughout the case, critical for completion in a timely fashion, and for retraction, instrumentation, and closure.  ANESTHESIA:   Spinal  ESTIMATED BLOOD LOSS: 75 mL  UNIQUE ASPECTS OF THE CASE: There was more bone loss on this side than the was on the contralateral side.  I cut with a 2 mm shim, and the tibial piece was not significantly large, but despite this I ended up with a 7 mm polyethylene insert.  There was significant hypertrophic osteophyte formation diffusely, similar to the contralateral side.  The medial facet of the patella had some grade 2 and grade 3 changes, the lateral side was intact, there was osteophyte rimming the patella which I removed with a rongure.  The lateral compartment was in excellent condition.  The ACL appeared intact although there was some hemorrhagic injection at its origin.  PREOPERATIVE INDICATIONS:  Diana [name] is a  63 y.o. female with a diagnosis of djd left knee who failed conservative measures and elected for surgical management.  She has had a contralateral partial knee replacement with excellent result.  The risks benefits and alternatives were discussed with the patient preoperatively including but not limited to the risks of infection, bleeding, nerve injury, cardiopulmonary complications, blood clots, the need for revision surgery, among others, and the patient was willing to proceed.  OPERATIVE IMPLANTS: Biomet [place] mobile bearing medial compartment arthroplasty femur size medium, tibia size D, bearing size 7.  OPERATIVE FINDINGS: Endstage grade 4 medial compartment osteoarthritis. No significant changes in the lateral or  patellofemoral joint.  The ACL was intact.  Medial side had eburnation with bone loss.  OPERATIVE PROCEDURE: The patient was brought to the operating room placed in the supine position.  Spinal anesthesia was administered. IV antibiotics were given. The lower extremity was placed in the legholder and prepped and draped in usual sterile fashion.  Time out was performed.  The leg was elevated and exsanguinated and the tourniquet was inflated. Anteromedial incision was performed, and I took care to preserve the MCL. Parapatellar incision was carried out, and the osteophytes were excised, along with the medial meniscus and a small portion of the fat pad.  The extra medullary tibial cutting jig was applied, using the spoon and the 68mm G-Clamp and the 2 mm shim, and I took care to protect the anterior cruciate ligament insertion and the tibial spine. The medial collateral ligament was also protected, and I resected my proximal tibia, matching the anatomic slope.   The proximal tibial bony cut was removed although it broke during extrication, and I had a small bit of osteophyte and remaining posterior lateral bone which I removed with an osteotome.  I turned my attention to the femur.  The intramedullary femoral rod was placed using the drill, and then using the appropriate reference, I assembled the femoral jig, setting my posterior cutting block. I resected my posterior femur, used the 0 spigot for the anterior femur, and then measured my gap.   I then used the appropriate mill to match the extension gap to the flexion gap. The second milling was at a 3 and then at a 4.  The gaps were then measured again with the appropriate feeler gauges. Once I had balanced  flexion and extension gaps, I then completed the preparation of the femur.  I milled off the anterior aspect of the distal femur to prevent impingement. I also exposed the tibia, and selected the above-named component, and then used the cutting jig  to prepare the keel slot on the tibia. I also used the awl to curette out the bone to complete the preparation of the keel. The back wall was intact.  I then placed trial components, and it was found to have excellent motion, and appropriate balance.  I then cemented the components into place, cementing the tibia first, removing all excess cement, and then cementing the femur.  All loose cement was removed.  The real polyethylene insert was applied manually, and the knee was taken through functional range of motion, and found to have excellent stability and restoration of joint motion, with excellent balance.  The wounds were irrigated copiously, and the parapatellar tissue closed with Vicryl, followed by Vicryl for the subcutaneous tissue, with routine closure with Steri-Strips and sterile gauze.  The tourniquet was released, and the patient was awakened and returned to PACU in stable and satisfactory condition. There were no complications.

## 2018-08-02 NOTE — Evaluation (Signed)
Physical Therapy Evaluation Patient Details Name: Diana [name] MRN: [id number] DOB: 23-May-1955 Today's Date: 08/02/2018   History of Present Illness  63 yo female s/p L UKR on 08/02/18. PMH includes anxiety, cancer, depression, HTN, migraines, obesity, R UKR.  Clinical Impression  Pt presents with L knee pain, decreased L knee ROM, increased time and effort to perform mobility tasks, and decreased activity tolerance post-operatively. Pt to benefit from acute PT to address deficits. Pt ambulated 50 ft with RW with min guard assist, verbal cuing for form and safety, pt limited by increasingly antalgic gait. Pt educated on ankle pumps (20/hour) to perform this afternoon/evening to increase circulation, to pt's tolerance and limited by pain. PT to progress mobility as tolerated, and will continue to follow acutely.        Follow Up Recommendations Follow surgeon's recommendation for DC plan and follow-up therapies;Supervision for mobility/OOB    Equipment Recommendations  None recommended by PT    Recommendations for Other Services       Precautions / Restrictions Precautions Precautions: Fall Restrictions Weight Bearing Restrictions: No Other Position/Activity Restrictions: WBAT      Mobility  Bed Mobility Overal bed mobility: Needs Assistance Bed Mobility: Supine to Sit     Supine to sit: HOB elevated;Min guard     General bed mobility comments: Min guard for safety, increased time and effort  Transfers Overall transfer level: Needs assistance Equipment used: Rolling walker (2 wheeled) Transfers: Sit to/from Stand Sit to Stand: Min guard;From elevated surface         General transfer comment: Min guard for safety, verbal cuing for hand placement when rising.  Ambulation/Gait Ambulation/Gait assistance: Min guard Gait Distance (Feet): 50 Feet Assistive device: Rolling walker (2 wheeled) Gait Pattern/deviations: Step-to pattern;Decreased step length -  right;Decreased step length - left;Decreased weight shift to left;Trunk flexed Gait velocity: decr   General Gait Details: min guard for safety, verbal cuing for sequencing and placement in RW.  Stairs            Wheelchair Mobility    Modified Rankin (Stroke Patients Only)       Balance                                             Pertinent Vitals/Pain Pain Assessment: 0-10 Pain Score: 2  Pain Location: L knee Pain Descriptors / Indicators: Sore Pain Intervention(s): Limited activity within patient's tolerance;Monitored during session;Premedicated before session;Repositioned    Home Living Family/patient expects to be discharged to:: Private residence   Available Help at Discharge: Family;Available 24 hours/day Type of Home: House Home Access: Stairs to enter Entrance Stairs-Rails: None Entrance Stairs-Number of Steps: 3 Home Layout: Two level;Able to live on main level with bedroom/bathroom Home Equipment: Crutches;Walker - 2 wheels;Shower seat      Prior Function Level of Independence: Independent with assistive device(s)         Comments: Works as PT at [employer] - hasn't worked since April due to L knee dysfunction. Pt reports using crutches for mobility PTA.     Hand Dominance   Dominant Hand: Right    Extremity/Trunk Assessment   Upper Extremity Assessment Upper Extremity Assessment: Overall WFL for tasks assessed    Lower Extremity Assessment Lower Extremity Assessment: Overall WFL for tasks assessed;LLE deficits/detail LLE Deficits / Details: suspected post-surgical LLE weakness; able to perform ankle pumps,  quad set, heel slide to 60*, SLR without lift assist or quad lag LLE Sensation: WNL    Cervical / Trunk Assessment Cervical / Trunk Assessment: Normal  Communication   Communication: No difficulties  Cognition Arousal/Alertness: Awake/alert Behavior During Therapy: WFL for tasks  assessed/performed Overall Cognitive Status: Within Functional Limits for tasks assessed                                        General Comments      Exercises     Assessment/Plan    PT Assessment Patient needs continued PT services  PT Problem List Decreased strength;Decreased mobility;Decreased range of motion;Decreased activity tolerance;Decreased balance;Decreased knowledge of use of DME;Pain       PT Treatment Interventions DME instruction;Therapeutic activities;Gait training;Therapeutic exercise;Patient/family education;Balance training;Stair training;Functional mobility training    PT Goals (Current goals can be found in the Care Plan section)  Acute Rehab PT Goals PT Goal Formulation: With patient Time For Goal Achievement: 08/09/18 Potential to Achieve Goals: Good    Frequency 7X/week   Barriers to discharge        Co-evaluation               AM-PAC PT "6 Clicks" Mobility  Outcome Measure Help needed turning from your back to your side while in a flat bed without using bedrails?: A Little Help needed moving from lying on your back to sitting on the side of a flat bed without using bedrails?: A Little Help needed moving to and from a bed to a chair (including a wheelchair)?: A Little Help needed standing up from a chair using your arms (e.g., wheelchair or bedside chair)?: A Little Help needed to walk in hospital room?: A Little Help needed climbing 3-5 steps with a railing? : A Little 6 Click Score: 18    End of Session Equipment Utilized During Treatment: Gait belt Activity Tolerance: Patient tolerated treatment well Patient left: in chair;with chair alarm set;with call bell/phone within reach;with SCD's reapplied Nurse Communication: Mobility status PT Visit Diagnosis: Other abnormalities of gait and mobility (R26.89);Difficulty in walking, not elsewhere classified (R26.2)    Time: 8299-3716 PT Time Calculation (min) (ACUTE ONLY):  16 min   Charges:   PT Evaluation $PT Eval Low Complexity: 1 Low         Julien Girt, PT Acute Rehabilitation Services Pager [phone]  Office [phone]   Diana [name] 08/02/2018, 7:17 PM

## 2018-08-02 NOTE — Anesthesia Postprocedure Evaluation (Signed)
Anesthesia Post Note  Patient: Diana [name]  Procedure(s) Performed: UNICOMPARTMENTAL KNEE (Left Knee)     Patient location during evaluation: PACU Anesthesia Type: Spinal Level of consciousness: oriented and awake and alert Pain management: pain level controlled Vital Signs Assessment: post-procedure vital signs reviewed and stable Respiratory status: spontaneous breathing and respiratory function stable Cardiovascular status: blood pressure returned to baseline and stable Postop Assessment: no headache, no backache and no apparent nausea or vomiting Anesthetic complications: no    Last Vitals:  Vitals:   08/02/18 1345 08/02/18 1431  BP: (!) 141/74 (!) 162/83  Pulse: 73 80  Resp: 14   Temp:  36.4 C  SpO2: 98% 100%    Last Pain:  Vitals:   08/02/18 1431  TempSrc: Oral  PainSc:                  Lynda Rainwater

## 2018-08-02 NOTE — H&P (Signed)
PREOPERATIVE H&P  Chief Complaint: Left knee primary localized osteoarthritis  HPI: [name] is a 63 y.o. female who presents for preoperative history and physical with a diagnosis of left knee primary localized osteoarthritis. Symptoms are rated as moderate to severe, and have been worsening.  This is significantly impairing activities of daily living.  She has elected for surgical management.   She has failed injections, activity modification, anti-inflammatories, and assistive devices.  Preoperative X-rays demonstrate end stage degenerative changes with osteophyte formation, loss of joint space, subchondral sclerosis.  She is had the other side done and is very happy with the results.  Past Medical History:  Diagnosis Date  . Anxiety    controlled  . Atrial septal defect    Small,non significant atrial septal defect with QP/QS of 1.25 by MRA.  . Cancer ([place])    melanoma left leg 2012  . Complication of anesthesia    gets migraine when she has to be NPO  . Depression   . Hypertension    Pt doesn't take meds and denies having BP issues however her diostolic in alway in high 80s -90s  . Hypothyroidism   . Migraine    occasionally  . Migraine headache   . Obesity   . Obstructive apnea    CPAP user since 2012.   Marland Kitchen PONV (postoperative nausea and vomiting)    n/v from migraine, not anesthesia  . Pseudotumor cerebri Diagnosed in March 2008-   She sees Dr Clovis Riley in this regard  . Statin intolerance    with elevated CRP   Past Surgical History:  Procedure Laterality Date  . BACK SURGERY    . BUNIONECTOMY    . FRACTURE SURGERY     left 5th metatarsal  . MASS EXCISION Left 03/22/2018   Procedure: EXCISION CYST DEBRIDEMENT DISTAL INTERPHALANGEAL LEFT MIDDLE FINGER;  Surgeon: Daryll Brod, MD;  Location: [place];  Service: Orthopedics;  Laterality: Left;  FAB  . ORTHOPEDIC SURGERY     lumbar surgery   . PARTIAL KNEE ARTHROPLASTY Right 09/15/2016   Procedure: UNICOMPARTMENTAL KNEE;  Surgeon: Marchia Bond, MD;  Location: [place];  Service: Orthopedics;  Laterality: Right;  . pin in the fifth metatarsal    . right knee arthroscopy     Social History   Socioeconomic History  . Marital status: Married    Spouse name: Jeneen Rinks  . Number of children: 0  . Years of education: masters  . Highest education level: Not on file  Occupational History    Employer: [employer]  . Financial resource strain: Not on file  . Food insecurity    Worry: Not on file    Inability: Not on file  . Transportation needs    Medical: Not on file    Non-medical: Not on file  Tobacco Use  . Smoking status: Never Smoker  . Smokeless tobacco: Never Used  Substance and Sexual Activity  . Alcohol use: Yes    Comment: one glass of wine on weekends  . Drug use: No  . Sexual activity: Not on file  Lifestyle  . Physical activity    Days per week: Not on file    Minutes per session: Not on file  . Stress: Not on file  Relationships  . Social Herbalist on phone: Not on file    Gets together: Not on file    Attends religious service: Not  on file    Active member of club or organization: Not on file    Attends meetings of clubs or organizations: Not on file    Relationship status: Not on file  Other Topics Concern  . 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 She was referred for evaluation of what was previously been diagnosed as an ASD in 2001 at [facility]. She was advised to have it followed medically. She is marrried and lives with her spouse . She may have two glasses  of wine a week. She does not use tobacco. She sleeps with a CPAP.   Caffeine Use: 1-2 cups daily   Family History  Problem Relation Age of Onset  . Alzheimer's disease Mother   . Rheumatic fever Father    Allergies  Allergen Reactions  . Augmentin  [Amoxicillin-Pot Clavulanate] Nausea And Vomiting and Other (See Comments)    Has patient had a PCN reaction causing immediate rash, facial/tongue/throat swelling, SOB or lightheadedness with hypotension: No Has patient had a PCN reaction causing severe rash involving mucus membranes or skin necrosis: No Has patient had a PCN reaction that required hospitalization: No Has patient had a PCN reaction occurring within the last 10 years: #  #  #  YES  #  #  #  > 2014 If all of the above answers are "NO", then may proceed with Cephalosporin use.    Prior to Admission medications   Medication Sig Start Date End Date Taking? Authorizing Provider  aspirin EC 81 MG tablet Take 1 tablet (81 mg total) by mouth daily. 03/23/13  Yes Sherren Mocha, MD  cetirizine (ZYRTEC) 10 MG tablet Take 10 mg by mouth daily.     Yes [provider]  levothyroxine (SYNTHROID, LEVOTHROID) 50 MCG tablet Take 1 tablet (50 mcg total) by mouth daily. 11/18/17  Yes Baxley, Cresenciano Lick, MD  Multiple Vitamin (MULTI-VITAMIN DAILY) TABS Take 6 tablets by mouth daily. Doterra Multivitamin   Yes [provider]  PRISTIQ 100 MG 24 hr tablet Take 100 mg by mouth daily.   Yes [provider]  SUMAtriptan (IMITREX) 100 MG tablet TAKE 1 TABLET BY MOUTH ONCE. MAY REPEAT IN 2 HOURS IF HEADACHE PERSISTS OR RECURS. MAX 2 TABLETS IN 24 HOURS Patient taking differently: Take 100 mg by mouth every 2 (two) hours as needed for migraine or headache. MAX 2 TABLETS IN 24 HOURS 06/01/18  Yes Baxley, Cresenciano Lick, MD  ALPRAZolam Duanne Moron) 0.5 MG tablet Take 0.5 mg by mouth daily as needed for anxiety. 07/06/16   [provider]     Positive ROS: All other systems have been reviewed and were otherwise negative with the exception of those mentioned in the HPI and as above.  Physical Exam: Estimated body mass index is 42.14 kg/m as calculated from the following:   Height as of 07/25/18: 5\' 9"  (1.753 m).   Weight as of 07/25/18: 129.4  kg.  General: Alert, no acute distress Cardiovascular: No pedal edema Respiratory: No cyanosis, no use of accessory musculature GI: No organomegaly, abdomen is soft and non-tender Skin: No lesions in the area of chief complaint Neurologic: Sensation intact distally Psychiatric: Patient is competent for consent with normal mood and affect Lymphatic: No axillary or cervical lymphadenopathy  MUSCULOSKELETAL: Left knee has varus alignment with crepitance and effusion.  Positive pseudolaxity.  Assessment: Left knee anteromedial osteoarthritis   Plan: Plan for Procedure(s):  LEFT UNICOMPARTMENTAL KNEE  The risks benefits and alternatives were discussed with the patient including but not limited to the risks of nonoperative treatment, versus surgical intervention including infection, bleeding, nerve injury,  blood clots, cardiopulmonary complications, morbidity, mortality, among others, and they were willing to proceed.   We discussed her obesity at length, and she has tried to optimize her weight, she is frustrated with her persistent pain and has elected to proceed despite the increased risk.  Patient's anticipated LOS is less than 2 midnights, meeting these requirements: - Younger than 69 - Lives within 1 hour of care - Has a competent adult at home to recover with post-op recover - NO history of  - Chronic pain requiring opiods  - Diabetes  - Coronary Artery Disease  - Heart failure  - Heart attack  - Stroke  - DVT/VTE  - Cardiac arrhythmia  - Respiratory Failure/COPD  - Renal failure  - Anemia  - Advanced Liver disease        Johnny Bridge, MD Cell [phone]   08/02/2018 9:29 AM

## 2018-08-02 NOTE — Transfer of Care (Signed)
Immediate Anesthesia Transfer of Care Note  Patient: Diana [name]  Procedure(s) Performed: UNICOMPARTMENTAL KNEE (Left Knee)  Patient Location: PACU  Anesthesia Type:Spinal  Level of Consciousness: awake, alert  and oriented  Airway & Oxygen Therapy: Patient Spontanous Breathing and Patient connected to nasal cannula oxygen  Post-op Assessment: Report given to RN and Post -op Vital signs reviewed and stable  Post vital signs: Reviewed and stable  Last Vitals:  Vitals Value Taken Time  BP 144/85 08/02/18 1245  Temp    Pulse 82 08/02/18 1246  Resp 17 08/02/18 1246  SpO2 100 % 08/02/18 1246  Vitals shown include unvalidated device data.  Last Pain:  Vitals:   08/02/18 0820  TempSrc: Oral         Complications: No apparent anesthesia complications

## 2018-08-02 NOTE — Progress Notes (Signed)
Assisted Dr. Miller with left, ultrasound guided, adductor canal block. Side rails up, monitors on throughout procedure. See vital signs in flow sheet. Tolerated Procedure well.  

## 2018-08-02 NOTE — Anesthesia Procedure Notes (Signed)
Anesthesia Regional Block: Adductor canal block   Pre-Anesthetic Checklist: ,, timeout performed, Correct Patient, Correct Site, Correct Laterality, Correct Procedure, Correct Position, site marked, Risks and benefits discussed,  Surgical consent,  Pre-op evaluation,  At surgeon's request and post-op pain management  Laterality: Left  Prep: chloraprep       Needles:  Injection technique: Single-shot  Needle Type: Stimiplex     Needle Length: 9cm  Needle Gauge: 21     Additional Needles:   Procedures:,,,, ultrasound used (permanent image in chart),,,,  Narrative:  Start time: 08/02/2018 9:27 AM End time: 08/02/2018 9:32 AM Injection made incrementally with aspirations every 5 mL.  Performed by: Personally  Anesthesiologist: Lynda Rainwater, MD

## 2018-08-02 NOTE — Anesthesia Procedure Notes (Signed)
Spinal  Patient location during procedure: OR End time: 08/02/2018 10:32 AM Staffing Resident/CRNA: Williford, Peggy D, CRNA Performed: anesthesiologist and resident/CRNA  Preanesthetic Checklist Completed: patient identified, site marked, surgical consent, pre-op evaluation, timeout performed, IV checked, risks and benefits discussed and monitors and equipment checked Spinal Block Patient position: sitting Prep: Betadine Patient monitoring: heart rate, continuous pulse ox and blood pressure Approach: midline Location: L2-3 Injection technique: single-shot Needle Needle type: Sprotte  Needle gauge: 24 G Needle length: 9 cm Assessment Sensory level: T6 Additional Notes Expiration date of kit checked and confirmed. Patient tolerated procedure well, without complications.       

## 2018-08-03 ENCOUNTER — Encounter (HOSPITAL_COMMUNITY): Payer: Self-pay | Admitting: Orthopedic Surgery

## 2018-08-03 DIAGNOSIS — M1712 Unilateral primary osteoarthritis, left knee: Secondary | ICD-10-CM | POA: Diagnosis not present

## 2018-08-03 LAB — BASIC METABOLIC PANEL
Anion gap: 9 (ref 5–15)
BUN: 19 mg/dL (ref 8–23)
CO2: 26 mmol/L (ref 22–32)
Calcium: 8.6 mg/dL — ABNORMAL LOW (ref 8.9–10.3)
Chloride: 104 mmol/L (ref 98–111)
Creatinine, Ser: 0.93 mg/dL (ref 0.44–1.00)
GFR calc Af Amer: >60 mL/min (ref >60)
GFR calc non Af Amer: >60 mL/min (ref >60)
Glucose, Bld: 135 mg/dL — ABNORMAL HIGH (ref 70–99)
Potassium: 4.5 mmol/L (ref 3.5–5.1)
Sodium: 139 mmol/L (ref 135–145)

## 2018-08-03 LAB — CBC
HCT: 36.8 % (ref 36.0–46.0)
Hemoglobin: 11.9 g/dL — ABNORMAL LOW (ref 12.0–15.0)
MCH: 29.5 pg (ref 26.0–34.0)
MCHC: 32.3 g/dL (ref 30.0–36.0)
MCV: 91.3 fL (ref 80.0–100.0)
Platelets: 195 10*3/uL (ref 150–400)
RBC: 4.03 MIL/uL (ref 3.87–5.11)
RDW: 12.2 % (ref 11.5–15.5)
WBC: 11.5 10*3/uL — ABNORMAL HIGH (ref 4.0–10.5)
nRBC: 0 % (ref 0.0–0.2)

## 2018-08-03 NOTE — Progress Notes (Addendum)
Patient ID: Diana [name], female   DOB: 06-Dec-1955, 63 y.o.   MRN: [id number]     Subjective:  Patient reports pain as mild.  Patient in bed and in no acute distress.  Denies CP or SOB  Objective:   VITALS:   Vitals:   08/02/18 2011 08/02/18 2209 08/03/18 0050 08/03/18 0513  BP: (!) 158/88  135/72 (!) 152/83  Pulse: 90 88 84 80  Resp:  18 16 16   Temp:   98.1 F (36.7 C) 98 F (36.7 C)  TempSrc:    Oral  SpO2: 97% 96% 98% 98%  Weight:  132.1 kg    Height:  5\' 9"  (1.753 m)      ABD soft Sensation intact distally Dorsiflexion/Plantar flexion intact Incision: dressing C/D/I and no drainage   Lab Results  Component Value Date   WBC 11.5 (H) 08/03/2018   HGB 11.9 (L) 08/03/2018   HCT 36.8 08/03/2018   MCV 91.3 08/03/2018   PLT 195 08/03/2018   BMET    Component Value Date/Time   NA 139 08/03/2018 0325   K 4.5 08/03/2018 0325   CL 104 08/03/2018 0325   CO2 26 08/03/2018 0325   GLUCOSE 135 (H) 08/03/2018 0325   BUN 19 08/03/2018 0325   CREATININE 0.93 08/03/2018 0325   CREATININE 0.93 05/26/2018 0925   CALCIUM 8.6 (L) 08/03/2018 0325   GFRNONAA >60 08/03/2018 0325   GFRNONAA 65 05/26/2018 0925   GFRAA >60 08/03/2018 0325   GFRAA 75 05/26/2018 0925     Assessment/Plan: 1 Day Post-Op   Principal Problem:   Primary localized osteoarthritis of left knee Active Problems:   S/P left unicompartmental knee replacement   Advance diet Up with therapy WBAT  Dry dressing PRN DC home after PT   Patient's anticipated LOS is less than 2 midnights, meeting these requirements: - Younger than 33 - Lives within 1 hour of care - Has a competent adult at home to recover with post-op recover - NO history of  - Chronic pain requiring opiods  - Diabetes  - Coronary Artery Disease  - Heart failure  - Heart attack  - Stroke  - DVT/VTE  - Cardiac arrhythmia  - Respiratory Failure/COPD  - Renal failure  - Anemia  - Advanced Liver disease        Lunette Stands 08/03/2018, 8:25 AM  Discussed and agree with above.   Marchia Bond, MD Cell [phone]

## 2018-08-03 NOTE — Discharge Summary (Signed)
Physician Discharge Summary  Patient ID: Diana [name] MRN: [id number] DOB/AGE: 1956/01/04 63 y.o.  Admit date: 08/02/2018 Discharge date: 08/03/2018  Admission Diagnoses:  Primary localized osteoarthritis of left knee  Discharge Diagnoses:  Principal Problem:   Primary localized osteoarthritis of left knee Active Problems:   S/P left unicompartmental knee replacement   Past Medical History:  Diagnosis Date  . Anxiety    controlled  . Atrial septal defect    Small,non significant atrial septal defect with QP/QS of 1.25 by MRA.  . Cancer ([place])    melanoma left leg 2012  . Complication of anesthesia    gets migraine when she has to be NPO  . Depression   . Hypertension    Pt doesn't take meds and denies having BP issues however her diostolic in alway in high 80s -90s  . Hypothyroidism   . Migraine    occasionally  . Migraine headache   . Obesity   . Obstructive apnea    CPAP user since 2012.   Marland Kitchen PONV (postoperative nausea and vomiting)    n/v from migraine, not anesthesia  . Primary localized osteoarthritis of left knee 08/02/2018  . Pseudotumor cerebri Diagnosed in March 2008-   She sees Dr Clovis Riley in this regard  . Statin intolerance    with elevated CRP    Surgeries: Procedure(s): UNICOMPARTMENTAL KNEE on 08/02/2018   Consultants (if any):   Discharged Condition: Improved  Hospital Course: Diana [name] is an 63 y.o. female who was admitted 08/02/2018 with a diagnosis of Primary localized osteoarthritis of left knee and went to the operating room on 08/02/2018 and underwent the above named procedures.    She was given perioperative antibiotics:  Anti-infectives (From admission, onward)   Start     Dose/Rate Route Frequency Ordered Stop   08/02/18 1600  ceFAZolin (ANCEF) IVPB 2g/100 mL premix     2 g 200 mL/hr over 30 Minutes Intravenous Every 6 hours 08/02/18 1438 08/02/18 2200   08/02/18 0600  ceFAZolin (ANCEF) 3 g in dextrose 5 % 50 mL IVPB     3 g 100  mL/hr over 30 Minutes Intravenous On call to O.R. 08/01/18 6144 08/02/18 1034    .  She was given sequential compression devices, early ambulation, and aspirin for DVT prophylaxis.  She benefited maximally from the hospital stay and there were no complications.    Recent vital signs:  Vitals:   08/03/18 0513 08/03/18 0829  BP: (!) 152/83 (!) 155/73  Pulse: 80 79  Resp: 16 18  Temp: 98 F (36.7 C)   SpO2: 98% 100%    Recent laboratory studies:  Lab Results  Component Value Date   HGB 11.9 (L) 08/03/2018   HGB 13.6 07/25/2018   HGB 14.1 05/26/2018   Lab Results  Component Value Date   WBC 11.5 (H) 08/03/2018   PLT 195 08/03/2018   No results found for: INR Lab Results  Component Value Date   NA 139 08/03/2018   K 4.5 08/03/2018   CL 104 08/03/2018   CO2 26 08/03/2018   BUN 19 08/03/2018   CREATININE 0.93 08/03/2018   GLUCOSE 135 (H) 08/03/2018    Discharge Medications:   Allergies as of 08/03/2018      Reactions   Augmentin [amoxicillin-pot Clavulanate] Nausea And Vomiting, Other (See Comments)   Has patient had a PCN reaction causing immediate rash, facial/tongue/throat swelling, SOB or lightheadedness with hypotension: No Has patient had a PCN reaction causing severe rash  involving mucus membranes or skin necrosis: No Has patient had a PCN reaction that required hospitalization: No Has patient had a PCN reaction occurring within the last 10 years: #  #  #  YES  #  #  #  > 2014 If all of the above answers are "NO", then may proceed with Cephalosporin use.      Medication List    STOP taking these medications   Multi-Vitamin Daily Tabs     TAKE these medications   ALPRAZolam 0.5 MG tablet Commonly known as: XANAX Take 0.5 mg by mouth daily as needed for anxiety.   aspirin EC 325 MG tablet Take 1 tablet (325 mg total) by mouth 2 (two) times daily. What changed:   medication strength  how much to take  when to take this   baclofen 10 MG  tablet Commonly known as: LIORESAL Take 1 tablet (10 mg total) by mouth 3 (three) times daily. As needed for muscle spasm   cetirizine 10 MG tablet Commonly known as: ZYRTEC Take 10 mg by mouth daily.   HYDROcodone-acetaminophen 10-325 MG tablet Commonly known as: Norco Take 1 tablet by mouth every 6 (six) hours as needed.   levothyroxine 50 MCG tablet Commonly known as: SYNTHROID Take 1 tablet (50 mcg total) by mouth daily.   ondansetron 4 MG tablet Commonly known as: Zofran Take 1 tablet (4 mg total) by mouth every 8 (eight) hours as needed for nausea or vomiting.   Pristiq 100 MG 24 hr tablet Generic drug: desvenlafaxine Take 100 mg by mouth daily.   sennosides-docusate sodium 8.6-50 MG tablet Commonly known as: SENOKOT-S Take 2 tablets by mouth daily.   SUMAtriptan 100 MG tablet Commonly known as: IMITREX TAKE 1 TABLET BY MOUTH ONCE. MAY REPEAT IN 2 HOURS IF HEADACHE PERSISTS OR RECURS. MAX 2 TABLETS IN 24 HOURS What changed: See the new instructions.       Diagnostic Studies: Dg Knee Left Port  Result Date: 08/02/2018 CLINICAL DATA:  Postop left knee. EXAM: PORTABLE LEFT KNEE - 1-2 VIEW COMPARISON:  None. FINDINGS: The patient is status post left knee replacement. Hardware is in good position. Postoperative air is noted. IMPRESSION: Replacement of the medial compartment of the left knee. Postoperative air. Hardware is in good position. Electronically Signed   By: Dorise Bullion III M.D   On: 08/02/2018 13:26   Mm 3d Screen Breast Bilateral  Result Date: 07/26/2018 CLINICAL DATA:  Screening. EXAM: DIGITAL SCREENING BILATERAL MAMMOGRAM WITH TOMO AND CAD COMPARISON:  Previous exam(s). ACR Breast Density Category a: The breast tissue is almost entirely fatty. FINDINGS: There are no findings suspicious for malignancy. Images were processed with CAD. IMPRESSION: No mammographic evidence of malignancy. A result letter of this screening mammogram will be mailed directly to the  patient. RECOMMENDATION: Screening mammogram in one year. (Code:[id number]-B-01Y) BI-RADS CATEGORY  1: Negative. Electronically Signed   By: Margarette Canada M.D.   On: 07/26/2018 10:22    Disposition: Discharge disposition: 01-Home or Self Care         Follow-up Information    Schedule an appointment as soon as possible for a visit with Marchia Bond, MD.   Specialty: Orthopedic Surgery Contact information: [address] [place] [address] [phone]            Signed: Johnny Bridge 08/03/2018, 10:59 AM

## 2018-08-03 NOTE — Progress Notes (Signed)
Physical Therapy Treatment Patient Details Name: Diana [name] MRN: [id number] DOB: 1955/02/26 Today's Date: 08/03/2018    History of Present Illness Pt is a 63 y/o female s/p L UKR on 08/02/18. PMH includes anxiety, cancer, depression, HTN, migraines, obesity, R UKR.    PT Comments    Progressing with mobility. Reviewed/practied gait training and stair training. All education completed. Okay to d/c from PT standpoint-made RN aware.    Follow Up Recommendations  Outpatient PT     Equipment Recommendations  None recommended by PT    Recommendations for Other Services       Precautions / Restrictions Precautions Precautions: Fall;Knee Precaution Comments: reviewed positioning of LE following knee sx with pt Restrictions Weight Bearing Restrictions: No LLE Weight Bearing: Weight bearing as tolerated    Mobility  Bed Mobility Overal bed mobility: Needs Assistance Bed Mobility: Supine to Sit;Sit to Supine     Supine to sit: Supervision;HOB elevated Sit to supine: Supervision;HOB elevated   General bed mobility comments: for safety, increased time and effort  Transfers Overall transfer level: Needs assistance Equipment used: Rolling walker (2 wheeled) Transfers: Sit to/from Stand Sit to Stand: Min guard;From elevated surface         General transfer comment: Min guard for safety, verbal cuing for hand placement when rising.  Ambulation/Gait Ambulation/Gait assistance: Min guard Gait Distance (Feet): 75 Feet Assistive device: Rolling walker (2 wheeled) Gait Pattern/deviations: Step-to pattern;Decreased step length - right;Decreased step length - left;Decreased weight shift to left;Trunk flexed     General Gait Details: min guard for safety, verbal cuing for sequencing and placement in RW.   Stairs Stairs: Yes Stairs assistance: Min assist Stair Management: Backwards;With walker;Step to pattern Number of Stairs: 2 General stair comments: VCs safety,  technique, hand placement. Assist to position/stabilize walker and pt.   Wheelchair Mobility    Modified Rankin (Stroke Patients Only)       Balance Overall balance assessment: Mild deficits observed, not formally tested                                          Cognition Arousal/Alertness: Awake/alert Behavior During Therapy: WFL for tasks assessed/performed Overall Cognitive Status: Within Functional Limits for tasks assessed                                        Exercises    General Comments        Pertinent Vitals/Pain Pain Assessment: 0-10 Pain Score: 7  Pain Location: L knee Pain Descriptors / Indicators: Sore Pain Intervention(s): Monitored during session;Ice applied    Home Living                      Prior Function            PT Goals (current goals can now be found in the care plan section) Acute Rehab PT Goals PT Goal Formulation: With patient Time For Goal Achievement: 08/09/18 Potential to Achieve Goals: Good Progress towards PT goals: Progressing toward goals    Frequency    7X/week      PT Plan Current plan remains appropriate    Co-evaluation              AM-PAC PT "6 Clicks" Mobility   Outcome Measure  Help needed turning from your back to your side while in a flat bed without using bedrails?: None Help needed moving from lying on your back to sitting on the side of a flat bed without using bedrails?: None Help needed moving to and from a bed to a chair (including a wheelchair)?: A Little Help needed standing up from a chair using your arms (e.g., wheelchair or bedside chair)?: A Little Help needed to walk in hospital room?: A Little Help needed climbing 3-5 steps with a railing? : A Little 6 Click Score: 20    End of Session Equipment Utilized During Treatment: Gait belt Activity Tolerance: Patient tolerated treatment well Patient left: in bed;with call bell/phone within  reach Nurse Communication: Mobility status PT Visit Diagnosis: Other abnormalities of gait and mobility (R26.89);Difficulty in walking, not elsewhere classified (R26.2)     Time: 7824-2353 PT Time Calculation (min) (ACUTE ONLY): 9 min  Charges:  $Gait Training: 8-22 mins $Therapeutic Exercise: 8-22 mins               Weston Anna, PT Acute Rehabilitation Services Pager: [phone] Office: [phone]

## 2018-08-03 NOTE — Progress Notes (Signed)
Physical Therapy Progress Note  Clinical Impression: Pt seen for LE strengthening therex, which she tolerated very well with 0/10 pain reported. Pt also performed all therex actively, no assistance needed. PT provided pt education re: positioning of LE, ice and car transfers. Pt would continue to benefit from skilled physical therapy services at this time while admitted and after d/c to address the below listed limitations in order to improve overall safety and independence with functional mobility.  Sherie Don, [place], DPT  Acute Rehabilitation Services Pager [phone] Office [phone]     08/03/18 1000  PT Visit Information  Last PT Received On 08/03/18  Assistance Needed +1  History of Present Illness Pt is a 63 y/o female s/p L UKR on 08/02/18. PMH includes anxiety, cancer, depression, HTN, migraines, obesity, R UKR.  Precautions  Precautions Fall;Knee  Precaution Comments reviewed positioning of LE following knee sx with pt  Restrictions  Weight Bearing Restrictions Yes  LLE Weight Bearing WBAT  Pain Assessment  Pain Assessment 0-10  Pain Score 0  Cognition  Arousal/Alertness Awake/alert  Behavior During Therapy WFL for tasks assessed/performed  Overall Cognitive Status Within Functional Limits for tasks assessed  Exercises  Exercises Total Joint  Total Joint Exercises  Ankle Circles/Pumps AROM;Both;20 reps;Supine  Quad Sets AROM;Strengthening;Left;10 reps;Supine  Gluteal Sets AROM;Strengthening;Both;10 reps;Supine  Short Arc Quad AROM;Strengthening;Left;10 reps;Supine  Heel Slides AROM;Strengthening;Left;10 reps;Supine  Hip ABduction/ADduction AROM;Strengthening;Left;10 reps;Supine  Straight Leg Raises AROM;Strengthening;Left;10 reps;Supine  PT - End of Session  Activity Tolerance Patient tolerated treatment well  Patient left in bed;with call bell/phone within reach  Nurse Communication Mobility status   PT - Assessment/Plan  PT Plan Current plan remains  appropriate  PT Visit Diagnosis Other abnormalities of gait and mobility (R26.89);Difficulty in walking, not elsewhere classified (R26.2)  PT Frequency (ACUTE ONLY) 7X/week  Follow Up Recommendations Outpatient PT  PT equipment None recommended by PT  AM-PAC PT "6 Clicks" Mobility Outcome Measure (Version 2)  Help needed turning from your back to your side while in a flat bed without using bedrails? 4  Help needed moving from lying on your back to sitting on the side of a flat bed without using bedrails? 4  Help needed moving to and from a bed to a chair (including a wheelchair)? 3  Help needed standing up from a chair using your arms (e.g., wheelchair or bedside chair)? 3  Help needed to walk in hospital room? 3  Help needed climbing 3-5 steps with a railing?  3  6 Click Score 20  Consider Recommendation of Discharge To: Home with no services  PT Goal Progression  Progress towards PT goals Progressing toward goals  Acute Rehab PT Goals  PT Goal Formulation With patient  Time For Goal Achievement 08/09/18  Potential to Achieve Goals Good  PT Time Calculation  PT Start Time (ACUTE ONLY) 6294  PT Stop Time (ACUTE ONLY) 0855  PT Time Calculation (min) (ACUTE ONLY) 13 min  PT General Charges  $$ ACUTE PT VISIT 1 Visit  PT Treatments  $Therapeutic Exercise 8-22 mins

## 2018-08-11 ENCOUNTER — Telehealth: Payer: Self-pay | Admitting: *Deleted

## 2018-08-11 NOTE — Telephone Encounter (Signed)

## 2018-08-12 ENCOUNTER — Other Ambulatory Visit: Payer: Self-pay

## 2018-08-12 ENCOUNTER — Ambulatory Visit (INDEPENDENT_AMBULATORY_CARE_PROVIDER_SITE_OTHER)
Admission: RE | Admit: 2018-08-12 | Discharge: 2018-08-12 | Disposition: A | Discharge status: Home / Self Care | Payer: Self-pay | Source: Ambulatory Visit | Attending: Internal Medicine | Admitting: Internal Medicine

## 2018-08-12 DIAGNOSIS — E782 Mixed hyperlipidemia: Secondary | ICD-10-CM

## 2018-09-12 ENCOUNTER — Telehealth: Payer: Self-pay | Admitting: Internal Medicine

## 2018-09-12 MED ORDER — HYDROCHLOROTHIAZIDE 25 MG PO TABS: 25.0000 mg | ORAL_TABLET | Freq: Every day | ORAL | 0 refills | Status: DC

## 2018-09-12 NOTE — Telephone Encounter (Signed)
The first step would be HCTZ 25 mg daily with follow up here in 2 weeks. Where would she like this Rx sent?

## 2018-09-12 NOTE — Telephone Encounter (Signed)
Call in HCTZ 25 mg daily( #30) with directions one po daily

## 2018-09-12 NOTE — Telephone Encounter (Signed)
Scheduled appointment and she would like medicine called into [facility], also ask her to keep a log of blood pressures to bring to appointment.

## 2018-09-12 NOTE — Telephone Encounter (Signed)
Diana [name] [id number]  Logynn called to say she went to [facility] yesterday for Migraine, nausea for two days, her blood pressure there was 168-108, however she stated that her blood pressure has been running around 145/90, 145/95, she is ready to do something about her blood pressure running high, would like to come in and discuss.

## 2018-09-27 ENCOUNTER — Ambulatory Visit (INDEPENDENT_AMBULATORY_CARE_PROVIDER_SITE_OTHER): Payer: PRIVATE HEALTH INSURANCE | Admitting: Internal Medicine

## 2018-09-27 ENCOUNTER — Other Ambulatory Visit: Payer: Self-pay

## 2018-09-27 ENCOUNTER — Encounter: Payer: Self-pay | Admitting: Internal Medicine

## 2018-09-27 VITALS — BP 120/80 | HR 104 | Temp 98.3°F | Ht 69.0 in | Wt 292.0 lb

## 2018-09-27 DIAGNOSIS — I10 Essential (primary) hypertension: Secondary | ICD-10-CM

## 2018-09-27 MED ORDER — AMLODIPINE BESYLATE 5 MG PO TABS: 5.0000 mg | ORAL_TABLET | Freq: Every day | ORAL | 1 refills | Status: DC

## 2018-09-27 NOTE — Progress Notes (Signed)
   Subjective:    Patient ID: [name], female    DOB: 1955/03/05, 63 y.o.   MRN: [id number]  HPI Seen in May for CPE and BP was elevated at 140/90. In July had left unicompartment knee replacement.  Is doing well from that surgery.  136/94,  131/93, 122/80 our readings that she brings in today.  This is despite being on HCTZ which was started in late August.  Review of Systems see Olga Millers is anxious and does not want to have uncontrolled high blood pressure.     Objective:   Physical Exam Neck is supple without JVD thyromegaly or carotid bruits.  Chest clear to auscultation.  Cardiac exam regular rate and rhythm.  No lower extremity edema       Assessment & Plan:  Mild hypertension-she called in August saying her blood pressure was elevated and we started her on HCTZ 25 mg daily.  She does not think this is controlling her blood pressure based on recent readings  Plan: Trial of amlodipine 5 mg daily.  She will monitor her blood pressure at home and let me know how it is going.  Needs basic metabolic panel in the near future on HCTZ if she stays on it.

## 2018-09-28 ENCOUNTER — Other Ambulatory Visit: Payer: Self-pay | Admitting: Internal Medicine

## 2018-10-05 ENCOUNTER — Other Ambulatory Visit: Payer: Self-pay | Admitting: Internal Medicine

## 2018-10-07 ENCOUNTER — Other Ambulatory Visit: Payer: Self-pay | Admitting: Internal Medicine

## 2018-10-16 NOTE — Patient Instructions (Signed)
Recently started on HCTZ.  Add amlodipine 5 mg daily.  Monitor blood pressure at home and let me know how it is going

## 2018-10-20 ENCOUNTER — Other Ambulatory Visit: Payer: Self-pay

## 2018-10-20 MED ORDER — AMLODIPINE BESYLATE 5 MG PO TABS: 5.0000 mg | ORAL_TABLET | Freq: Every day | ORAL | 5 refills | Status: DC

## 2018-11-03 ENCOUNTER — Encounter: Payer: Self-pay | Admitting: Internal Medicine

## 2018-11-03 ENCOUNTER — Other Ambulatory Visit: Payer: Self-pay

## 2018-11-03 ENCOUNTER — Ambulatory Visit (INDEPENDENT_AMBULATORY_CARE_PROVIDER_SITE_OTHER): Payer: PRIVATE HEALTH INSURANCE | Admitting: Internal Medicine

## 2018-11-03 VITALS — BP 126/73 | HR 89 | Temp 97.2°F | Ht 69.0 in | Wt 304.4 lb

## 2018-11-03 DIAGNOSIS — Z789 Other specified health status: Secondary | ICD-10-CM

## 2018-11-03 DIAGNOSIS — E782 Mixed hyperlipidemia: Secondary | ICD-10-CM

## 2018-11-03 NOTE — Progress Notes (Signed)
OFFICE NOTE  Chief Complaint:  Follow-up calcium score  Primary Care Physician: Diana Showers, MD  HPI:  Diana [name] is a 63 y.o. female seen today for lipid evaluation at the request of Diana. Renold [name].  She is a pleasant 63 year old female who was remotely followed by Diana. Doreatha [name] and then Diana. Burt [name] for a small secundum atrial septal defect.  She has not had this repaired.  Other medical problems include morbid obesity, obstructive sleep apnea on CPAP and hypothyroidism.  She denies any significant history of hypertension, diabetes and no real significant coronary disease history.  Both of her parents she has had lived into their 37s.  He does have a history of dyslipidemia which is in her assessment primarily diet driven.  She said many years ago did her cholesterol was much lower about 150 for total cholesterol however it subsequently has risen with significant weight gain.  She is also having problems ambulating and has an upcoming knee surgery scheduled.  She hopes to work on additional weight loss and dietary changes after that.  She says she is under a lot of stress and is a "stress eater".  She is trying a number of commercial diets.  Profile a month ago showed total cholesterol 253, HDL 40, triglycerides 223 and LDL of 175.  This represents mixed dyslipidemia and typical pattern for metabolic syndrome.  This is also an atherogenic lipid profile.  She has previously taken rosuvastatin which caused significant myalgias and recently was on pravastatin which she said caused sleep disturbance and was not tolerated.  She does not have any known coronary disease history.  She did have a vascular screen profile done a couple of years ago which showed some mild right internal carotid plaque.  11/03/2018  Diana [name] returns today for follow-up.  We did perform a calcium score which was 0.  Overall I think this gives [place] some reassurance with regards to her lipids however if she persistently has elevated  lipids then she will be at risk for coronary disease in the future.  I would now recommend aggressive work on her diet and weight loss.  She has been working with a dietitian however is gained about 15 pounds since I last saw her.  She says her "body is out of whack".  Hopefully this can be better controlled.  PMHx:  Past Medical History:  Diagnosis Date  . Anxiety    controlled  . Atrial septal defect    Small,non significant atrial septal defect with QP/QS of 1.25 by MRA.  . Cancer ([place])    melanoma left leg 2012  . Complication of anesthesia    gets migraine when she has to be NPO  . Depression   . Hypertension    Pt doesn't take meds and denies having BP issues however her diostolic in alway in high 80s -90s  . Hypothyroidism   . Migraine    occasionally  . Migraine headache   . Obesity   . Obstructive apnea    CPAP user since 2012.   Marland Kitchen PONV (postoperative nausea and vomiting)    n/v from migraine, not anesthesia  . Primary localized osteoarthritis of left knee 08/02/2018  . Pseudotumor cerebri Diagnosed in March 2008-   She sees Diana [name] in this regard  . Statin intolerance    with elevated CRP    Past Surgical History:  Procedure Laterality Date  . BACK SURGERY    . BUNIONECTOMY    . FRACTURE SURGERY  left 5th metatarsal  . MASS EXCISION Left 03/22/2018   Procedure: EXCISION CYST DEBRIDEMENT DISTAL INTERPHALANGEAL LEFT MIDDLE FINGER;  Surgeon: Diana Brod, MD;  Location: [facility];  Service: Orthopedics;  Laterality: Left;  FAB  . ORTHOPEDIC SURGERY     lumbar surgery   . PARTIAL KNEE ARTHROPLASTY Right 09/15/2016   Procedure: UNICOMPARTMENTAL KNEE;  Surgeon: Diana Bond, MD;  Location: [facility];  Service: Orthopedics;  Laterality: Right;  . PARTIAL KNEE ARTHROPLASTY Left 08/02/2018   Procedure: UNICOMPARTMENTAL KNEE;  Surgeon: Diana Bond, MD;  Location: [facility];  Service: Orthopedics;  Laterality: Left;  . pin in the fifth metatarsal    . right  knee arthroscopy      FAMHx:  Family History  Problem Relation Age of Onset  . Alzheimer's disease Mother   . Rheumatic fever Father     SOCHx:   reports that she has never smoked. She has never used smokeless tobacco. She reports current alcohol use. She reports that she does not use drugs.  ALLERGIES:  Allergies  Allergen Reactions  . Augmentin [Amoxicillin-Pot Clavulanate] Nausea And Vomiting and Other (See Comments)    Has patient had a PCN reaction causing immediate rash, facial/tongue/throat swelling, SOB or lightheadedness with hypotension: No Has patient had a PCN reaction causing severe rash involving mucus membranes or skin necrosis: No Has patient had a PCN reaction that required hospitalization: No Has patient had a PCN reaction occurring within the last 10 years: #  #  #  YES  #  #  #  > 2014 If all of the above answers are "NO", then may proceed with Cephalosporin use.     ROS: Pertinent items noted in HPI and remainder of comprehensive ROS otherwise negative.  HOME MEDS: Current Outpatient Medications on File Prior to Visit  Medication Sig Dispense Refill  . ALPRAZolam (XANAX) 0.5 MG tablet Take 0.5 mg by mouth daily as needed for anxiety.  0  . amLODipine (NORVASC) 5 MG tablet Take 1 tablet (5 mg total) by mouth daily. 30 tablet 5  . cetirizine (ZYRTEC) 10 MG tablet Take 10 mg by mouth daily.      . hydrochlorothiazide (HYDRODIURIL) 25 MG tablet TAKE 1 TABLET BY MOUTH EVERY DAY 90 tablet 2  . levothyroxine (SYNTHROID) 50 MCG tablet TAKE 1 TABLET BY MOUTH DAILY. 90 tablet 1  . PRISTIQ 100 MG 24 hr tablet Take 100 mg by mouth daily.  1  . SUMAtriptan (IMITREX) 100 MG tablet TAKE 1 TABLET BY MOUTH ONCE. MAY REPEAT IN 2 HOURS IF HEADACHE PERSISTS OR RECURS. MAX OF 2 TABLETS IN 24 HOURS 27 tablet 2   No current facility-administered medications on file prior to visit.     LABS/IMAGING: No results found for this or any previous visit (from the past 48 hour(s)).  No results found.  LIPID PANEL:    Component Value Date/Time   CHOL 253 (H) 05/26/2018 0925   TRIG 223 (H) 05/26/2018 0925   HDL 40 (L) 05/26/2018 0925   CHOLHDL 6.3 (H) 05/26/2018 0925   VLDL 35 (H) 02/13/2016 1115   LDLCALC 175 (H) 05/26/2018 0925     WEIGHTS: Wt Readings from Last 3 Encounters:  11/03/18 (!) 304 lb 6.4 oz (138.1 kg)  09/27/18 292 lb (132.5 kg)  08/02/18 291 lb 3.6 oz (132.1 kg)    VITALS: BP 126/73   Pulse 89   Temp (!) 97.2 F (36.2 C)   Ht 5\' 9"  (1.753 m)  Wt (!) 304 lb 6.4 oz (138.1 kg)   SpO2 96%   BMI 44.95 kg/m   EXAM: Deferred  EKG: deferred  ASSESSMENT: 1. Mixed dyslipidemia - zero CAC score (07/2018) 2. Statin intolerance 3. Morbid obesity 4. Mild carotid artery disease 5. History of small secundum ASD status post repair  PLAN: 1.   Ms. Rinella had a 0 coronary artery calcium score.  Overall this portends a lower risk.  At this point given weight gain I would continue to work with dietitian in the weight management clinic at weight loss and dietary changes to improve her lipids.  If this is not significant improved in 6 months then we would consider medical therapy.  Plan follow-up with me in 6 months with a lipid profile.  Pixie Casino, MD, [facility], [place] Director of the Advanced Lipid Disorders &  Cardiovascular Risk Reduction Clinic Diplomate of the American Board of Clinical Lipidology Attending Cardiologist  Direct Dial: [phone]  Fax: [phone]  Website:  [email or web address] 11/03/2018, 8:37 AM

## 2018-11-03 NOTE — Patient Instructions (Addendum)
Medication Instructions:  Your physician recommends that you continue on your current medications as directed. Please refer to the Current Medication list given to you today.  *If you need a refill on your cardiac medications before your next appointment, please call your pharmacy*  Lab Work: FASTING lab work in 6 months (before next lipid clinic appointment) If you have labs (blood work) drawn today and your tests are completely normal, you will receive your results only by: Marland Kitchen MyChart Message (if you have MyChart) OR . A paper copy in the mail If you have any lab test that is abnormal or we need to change your treatment, we will call you to review the results.  Testing/Procedures: None   Follow-Up: At [facility], you and your health needs are our priority.  As part of our continuing mission to provide you with exceptional heart care, we have created designated Provider Care Teams.  These Care Teams include your primary Cardiologist (physician) and Advanced Practice Providers (APPs -  Physician Assistants and Nurse Practitioners) who all work together to provide you with the care you need, when you need it.  Your next appointment:   6 months  The format for your next appointment:   Virtual Visit   Provider:   Dr. Debara Pickett - lipid clinic  Other Instructions

## 2018-11-10 ENCOUNTER — Other Ambulatory Visit: Payer: Self-pay

## 2018-11-10 ENCOUNTER — Encounter: Payer: Self-pay | Admitting: Cardiovascular Disease

## 2018-11-10 ENCOUNTER — Ambulatory Visit (INDEPENDENT_AMBULATORY_CARE_PROVIDER_SITE_OTHER): Payer: PRIVATE HEALTH INSURANCE | Admitting: Cardiovascular Disease

## 2018-11-10 VITALS — BP 140/102 | HR 93 | Ht 69.0 in | Wt 302.2 lb

## 2018-11-10 DIAGNOSIS — Q211 Atrial septal defect, unspecified: Secondary | ICD-10-CM

## 2018-11-10 NOTE — Progress Notes (Signed)
Cardiology Office Note:    Date:  11/10/2018   ID:  Diana [name], DOB May 22, 1955, MRN [id number]  PCP:  Diana Showers, MD  Cardiologist:  Sherren Mocha, MD  Electrophysiologist:  None   Referring MD: Diana Showers, MD   Chief Complaint  Patient presents with  . Follow-up ASD    History of Present Illness:    Diana [name] is a 63 y.o. female with a hx of small secundum ASD that has been followed with serial imaging.  Her most recent echocardiogram in 2018 demonstrated normal LV and RV size and systolic function.  The patient is here alone today.  She continues to have a difficult time with weight loss.  Otherwise she has no specific complaints.  She had to wait a long time to have left knee surgery but she is pleased with the result.  She denies chest pain, shortness of breath, leg edema, orthopnea, PND, or heart palpitations.  Past Medical History:  Diagnosis Date  . Anxiety    controlled  . Atrial septal defect    Small,non significant atrial septal defect with QP/QS of 1.25 by MRA.  . Cancer ([place])    melanoma left leg 2012  . Complication of anesthesia    gets migraine when she has to be NPO  . Depression   . Hypertension    Pt doesn't take meds and denies having BP issues however her diostolic in alway in high 80s -90s  . Hypothyroidism   . Migraine    occasionally  . Migraine headache   . Obesity   . Obstructive apnea    CPAP user since 2012.   Marland Kitchen PONV (postoperative nausea and vomiting)    n/v from migraine, not anesthesia  . Primary localized osteoarthritis of left knee 08/02/2018  . Pseudotumor cerebri Diagnosed in March 2008-   She sees Dr Clovis Riley in this regard  . Statin intolerance    with elevated CRP    Past Surgical History:  Procedure Laterality Date  . BACK SURGERY    . BUNIONECTOMY    . FRACTURE SURGERY     left 5th metatarsal  . MASS EXCISION Left 03/22/2018   Procedure: EXCISION CYST DEBRIDEMENT DISTAL INTERPHALANGEAL LEFT MIDDLE  FINGER;  Surgeon: Daryll Brod, MD;  Location: [facility];  Service: Orthopedics;  Laterality: Left;  FAB  . ORTHOPEDIC SURGERY     lumbar surgery   . PARTIAL KNEE ARTHROPLASTY Right 09/15/2016   Procedure: UNICOMPARTMENTAL KNEE;  Surgeon: Marchia Bond, MD;  Location: [facility];  Service: Orthopedics;  Laterality: Right;  . PARTIAL KNEE ARTHROPLASTY Left 08/02/2018   Procedure: UNICOMPARTMENTAL KNEE;  Surgeon: Marchia Bond, MD;  Location: [facility];  Service: Orthopedics;  Laterality: Left;  . pin in the fifth metatarsal    . right knee arthroscopy      Current Medications: Current Meds  Medication Sig  . ALPRAZolam (XANAX) 0.5 MG tablet Take 0.5 mg by mouth daily as needed for anxiety.  Marland Kitchen amLODipine (NORVASC) 5 MG tablet Take 1 tablet (5 mg total) by mouth daily.  . cetirizine (ZYRTEC) 10 MG tablet Take 10 mg by mouth daily.    Marland Kitchen levothyroxine (SYNTHROID) 50 MCG tablet TAKE 1 TABLET BY MOUTH DAILY.  Marland Kitchen PRISTIQ 100 MG 24 hr tablet Take 100 mg by mouth daily.  . SUMAtriptan (IMITREX) 100 MG tablet TAKE 1 TABLET BY MOUTH ONCE. MAY REPEAT IN 2 HOURS IF HEADACHE PERSISTS OR RECURS. MAX OF 2 TABLETS IN 24  HOURS     Allergies:   Augmentin [amoxicillin-pot clavulanate]   Social History   Socioeconomic History  . Marital status: Married    Spouse name: Jeneen Rinks  . Number of children: 0  . Years of education: masters  . Highest education level: Not on file  Occupational History    Employer: Agency: [employer]  . Financial resource strain: Not on file  . Food insecurity    Worry: Not on file    Inability: Not on file  . Transportation needs    Medical: Not on file    Non-medical: Not on file  Tobacco Use  . Smoking status: Never Smoker  . Smokeless tobacco: Never Used  Substance and Sexual Activity  . Alcohol use: Yes    Comment: one glass of wine on weekends  . Drug use: No  . Sexual activity: Not on file  Lifestyle  .  Physical activity    Days per week: Not on file    Minutes per session: Not on file  . Stress: Not on file  Relationships  . Social Herbalist on phone: Not on file    Gets together: Not on file    Attends religious service: Not on file    Active member of club or organization: Not on file    Attends meetings of clubs or organizations: Not on file    Relationship status: Not on file  Other Topics Concern  . 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 She was referred for evaluation of what was previously been diagnosed as an ASD in 2001 at [facility]. She was advised to have it followed medically. She is marrried and lives with her spouse . She may have two glasses  of wine a week. She does not use tobacco. She sleeps with a CPAP.   Caffeine Use: 1-2 cups daily     Family History: The patient's family history includes Alzheimer's disease in her mother; Rheumatic fever in her father.  ROS:   Please see the history of present illness.    All other systems reviewed and are negative.  EKGs/Labs/Other Studies Reviewed:    The following studies were reviewed today: Echo 06-04-2016: Study Conclusions  - Left ventricle: The cavity size was normal. Wall thickness was   normal. Systolic function was normal. The estimated ejection   fraction was in the range of 60% to 65%. Left ventricular   diastolic function parameters were normal. - Left atrium: The atrium was mildly dilated. - Atrial septum: Poosible PFO If high concern for ASD or shunt   patient needs bubble study an or TEE. A patent foramen ovale   cannot be excluded. - Impressions: Thinning of the mid and basal atrial septum Possible   PFO  Impressions:  - Thinning of the mid and basal atrial septum Possible PFO  EKG:  EKG is ordered today.  The ekg ordered today demonstrates normal sinus rhythm 93 bpm, RSR prime in V1 suggests early RV conduction  delay  Recent Labs: 05/26/2018: ALT 23; TSH 3.02 08/03/2018: BUN 19; Creatinine, Ser 0.93; Hemoglobin 11.9; Platelets 195; Potassium 4.5; Sodium 139  Recent Lipid Panel    Component Value Date/Time   CHOL 253 (H) 05/26/2018 0925   TRIG 223 (H) 05/26/2018 0925   HDL 40 (L) 05/26/2018 0925   CHOLHDL 6.3 (H)  05/26/2018 0925   VLDL 35 (H) 02/13/2016 1115   LDLCALC 175 (H) 05/26/2018 0925    Physical Exam:    VS:  BP (!) 140/102   Pulse 93   Ht 5\' 9"  (1.753 m)   Wt (!) 302 lb 3.2 oz (137.1 kg)   SpO2 96%   BMI 44.63 kg/m     Wt Readings from Last 3 Encounters:  11/10/18 (!) 302 lb 3.2 oz (137.1 kg)  11/03/18 (!) 304 lb 6.4 oz (138.1 kg)  09/27/18 292 lb (132.5 kg)     GEN: pleasant obese woman in no acute distress HEENT: Normal NECK: No JVD; No carotid bruits LYMPHATICS: No lymphadenopathy CARDIAC: RRR, no murmurs, rubs, gallops RESPIRATORY:  Clear to auscultation without rales, wheezing or rhonchi  ABDOMEN: Soft, non-tender, non-distended MUSCULOSKELETAL:  No edema; No deformity  SKIN: Warm and dry NEUROLOGIC:  Alert and oriented x 3 PSYCHIATRIC:  Normal affect   ASSESSMENT:    1. ASD (atrial septal defect)    PLAN:    In order of problems listed above:  1. The patient's ASD has not been hemodynamically significant in the past based on normal RV/RA size, lack of pulmonary hypertension, and lack of symptoms.  It has been about 2-1/2 years since her last echo and this will be updated to reassess the potential hemodynamic sequelae of her ASD.   Medication Adjustments/Labs and Tests Ordered: Current medicines are reviewed at length with the patient today.  Concerns regarding medicines are outlined above.  Orders Placed This Encounter  Procedures  . EKG 12-Lead  . ECHOCARDIOGRAM COMPLETE   No orders of the defined types were placed in this encounter.   Patient Instructions  Medication Instructions:  Your provider recommends that you continue on your current  medications as directed. Please refer to the Current Medication list given to you today.   *If you need a refill on your cardiac medications before your next appointment, please call your pharmacy*   Testing/Procedures: Your provider has requested that you have an echocardiogram. Echocardiography is a painless test that uses sound waves to create images of your heart. It provides your doctor with information about the size and shape of your heart and how well your heart's chambers and valves are working. This procedure takes approximately one hour. There are no restrictions for this procedure.     Follow-Up: At [facility], you and your health needs are our priority.  As part of our continuing mission to provide you with exceptional heart care, we have created designated Provider Care Teams.  These Care Teams include your primary Cardiologist (physician) and Advanced Practice Providers (APPs -  Physician Assistants and Nurse Practitioners) who all work together to provide you with the care you need, when you need it. Your next appointment:   12 months The format for your next appointment:   In Person Provider:   You may see Sherren Mocha, MD or one of the following Advanced Practice Providers on your designated Care Team:    Richardson Dopp, PA-C  Vin Hopelawn, PA-C  Daune Perch, [place]     Signed, Sherren Mocha, MD  11/10/2018 5:23 PM    [place]

## 2018-11-10 NOTE — Patient Instructions (Signed)
Medication Instructions:  Your provider recommends that you continue on your current medications as directed. Please refer to the Current Medication list given to you today.   *If you need a refill on your cardiac medications before your next appointment, please call your pharmacy*  Testing/Procedures: Your provider has requested that you have an echocardiogram. Echocardiography is a painless test that uses sound waves to create images of your heart. It provides your doctor with information about the size and shape of your heart and how well your heart's chambers and valves are working. This procedure takes approximately one hour. There are no restrictions for this procedure.  Follow-Up: At [facility], you and your health needs are our priority.  As part of our continuing mission to provide you with exceptional heart care, we have created designated Provider Care Teams.  These Care Teams include your primary Cardiologist (physician) and Advanced Practice Providers (APPs -  Physician Assistants and Nurse Practitioners) who all work together to provide you with the care you need, when you need it. Your next appointment:   12 month(s) The format for your next appointment:   In Person Provider:   You may see Michael Cooper, MD or one of the following Advanced Practice Providers on your designated Care Team:    Scott Weaver, PA-C  Vin Bhagat, PA-C  Janine Hammond, NP 

## 2018-11-17 ENCOUNTER — Ambulatory Visit (HOSPITAL_COMMUNITY): Payer: PRIVATE HEALTH INSURANCE | Attending: Cardiovascular Disease

## 2018-11-17 ENCOUNTER — Other Ambulatory Visit: Payer: Self-pay

## 2018-11-17 DIAGNOSIS — Q211 Atrial septal defect, unspecified: Secondary | ICD-10-CM

## 2018-11-24 ENCOUNTER — Other Ambulatory Visit: Payer: PRIVATE HEALTH INSURANCE | Admitting: Internal Medicine

## 2018-11-29 ENCOUNTER — Other Ambulatory Visit: Payer: PRIVATE HEALTH INSURANCE | Admitting: Internal Medicine

## 2018-12-01 ENCOUNTER — Other Ambulatory Visit: Payer: Self-pay

## 2018-12-01 ENCOUNTER — Ambulatory Visit: Payer: PRIVATE HEALTH INSURANCE | Admitting: Internal Medicine

## 2018-12-01 ENCOUNTER — Other Ambulatory Visit: Payer: PRIVATE HEALTH INSURANCE | Admitting: Internal Medicine

## 2018-12-01 DIAGNOSIS — R7302 Impaired glucose tolerance (oral): Secondary | ICD-10-CM

## 2018-12-01 DIAGNOSIS — E782 Mixed hyperlipidemia: Secondary | ICD-10-CM

## 2018-12-02 LAB — HEMOGLOBIN A1C
Hgb A1c MFr Bld: 5.4 % of total Hgb (ref <5.7)
Mean Plasma Glucose: 108 (calc)
eAG (mmol/L): 6 (calc)

## 2018-12-02 LAB — LIPID PANEL
Cholesterol: 255 mg/dL — ABNORMAL HIGH (ref <200)
HDL: 42 mg/dL — ABNORMAL LOW (ref >=50)
LDL Cholesterol (Calc): 172 mg/dL (calc) — ABNORMAL HIGH
Non-HDL Cholesterol (Calc): 213 mg/dL (calc) — ABNORMAL HIGH (ref <130)
Total CHOL/HDL Ratio: 6.1 (calc) — ABNORMAL HIGH (ref <5.0)
Triglycerides: 248 mg/dL — ABNORMAL HIGH (ref <150)

## 2018-12-08 ENCOUNTER — Ambulatory Visit: Payer: PRIVATE HEALTH INSURANCE | Admitting: Internal Medicine

## 2019-02-03 ENCOUNTER — Ambulatory Visit (INDEPENDENT_AMBULATORY_CARE_PROVIDER_SITE_OTHER): Payer: PRIVATE HEALTH INSURANCE | Admitting: Internal Medicine

## 2019-02-03 ENCOUNTER — Telehealth: Payer: Self-pay

## 2019-02-03 ENCOUNTER — Encounter: Payer: Self-pay | Admitting: Internal Medicine

## 2019-02-03 ENCOUNTER — Ambulatory Visit: Payer: PRIVATE HEALTH INSURANCE | Attending: Internal Medicine

## 2019-02-03 VITALS — Ht 69.0 in

## 2019-02-03 DIAGNOSIS — Z8669 Personal history of other diseases of the nervous system and sense organs: Secondary | ICD-10-CM | POA: Diagnosis not present

## 2019-02-03 DIAGNOSIS — U071 COVID-19: Secondary | ICD-10-CM

## 2019-02-03 DIAGNOSIS — J0100 Acute maxillary sinusitis, unspecified: Secondary | ICD-10-CM | POA: Diagnosis not present

## 2019-02-03 DIAGNOSIS — Z20822 Contact with and (suspected) exposure to covid-19: Secondary | ICD-10-CM

## 2019-02-03 DIAGNOSIS — Z6841 Body Mass Index (BMI) 40.0 and over, adult: Secondary | ICD-10-CM | POA: Diagnosis not present

## 2019-02-03 MED ORDER — CLARITHROMYCIN 500 MG PO TABS: 500.0000 mg | ORAL_TABLET | Freq: Two times a day (BID) | ORAL | 0 refills | Status: DC

## 2019-02-03 MED ORDER — HYDROCODONE-HOMATROPINE 5-1.5 MG/5ML PO SYRP: 5.0000 mL | ORAL_SOLUTION | Freq: Three times a day (TID) | ORAL | 0 refills | Status: DC | PRN

## 2019-02-03 NOTE — Telephone Encounter (Signed)
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 Per Dr Verlene Mayer request.

## 2019-02-03 NOTE — Telephone Encounter (Signed)
rec'd another request from Dr. Verlene Mayer office to facilitate getting pt. an appt. for COVID test today.  Phone call to pt.  Scheduled pt. For appt. Today at [place] site at 5:30 PM.  Advised to wear a mask, and to follow direction from the [employer], as to parking and entering the building.  Pt. Verb. Understanding.

## 2019-02-03 NOTE — Telephone Encounter (Signed)
Rec'd staff message, per Dr. Verlene Mayer office to schedule pt. today for COVID testing.  Sent staff message to Dr. Verlene Mayer office, that the Patient Diana [name] is no longer scheduling the appts. for COVID testing.  Advised that if pt. Is a [employer] Employee, she should contact Health at Work @ [phone].  Also, sent information for patient to schedule her own test, if she is not a [employer] employee, via "[email or web address]", or to text "COVID to [phone]."

## 2019-02-04 LAB — NOVEL CORONAVIRUS, NAA: SARS-CoV-2, NAA: DETECTED — AB

## 2019-02-05 ENCOUNTER — Encounter: Payer: Self-pay | Admitting: Internal Medicine

## 2019-02-05 ENCOUNTER — Encounter (INDEPENDENT_AMBULATORY_CARE_PROVIDER_SITE_OTHER): Payer: Self-pay

## 2019-02-05 NOTE — Patient Instructions (Addendum)
Patient is positive for Covid 19. Have left message for her to contact infusion center to see if candidate for infusion therapy,  and she will notify her employer.

## 2019-02-05 NOTE — Progress Notes (Signed)
   Subjective:    Patient ID: Diana [name], female    DOB: 1955-02-12, 64 y.o.   MRN: [id number]  HPI  64 year old Female seen by interactive audio and video telecommunications today due to coronavirus pandemic.  She is identified using two identifiers as Diana [name], a patient in this practice.  She is agreeable to visit in this format today. Patient has developed cough and congestion recently.  Relates this to her usual occasional sinus infections. Last one treated here in  2019. She works in a Recruitment consultant.  No known COVID-19 exposure.  Has no fever, chills, headache, myalgias, dysgeusia.  She has mixed hyperlipidemia and BMI 44.63.  Reports she is afebrile.  Does not smoke.  History of OSA treated with CPAP.  Seen virtually getting into car to go get COVID-19 testing as I requested.  She worked today as usual. Wears mask at work.  Review of Systems see above     Objective:   Physical Exam Reports she is afebrile.  Is alert and not heard to be coughing.  Does not sound hoarse.       Assessment & Plan:  Acute respiratory infection-rule out COVID-19 infection  Plan: She is going to test center today.  She feels that she has her usual sinus infection so I am calling in Biaxin 500 mg twice daily for 10 days and Hycodan 1 teaspoon p.o. every 8 hours as needed cough.  Rest and drink plenty of fluids.  Addendum: February 05, 2019.  Her COVID-19 test is positive.  Connected with her via [place].  She is feeling okay as of Saturday evening Jan 16.  She will contact her employer about her diagnosis.  She may qualify for infusion therapy based on age and BMI.  She has pulse oximeter and will be monitoring that at home closely.  Husband is to be tested.   6pm Sunday evening: I have left message on her voice mail with phone number of infusion center she may call .   She called back and is doing OK. Says her employer wants her out 10 days from January 16. She is tocheck with infusion  center.

## 2019-02-06 ENCOUNTER — Other Ambulatory Visit: Payer: Self-pay | Admitting: Nurse Practitioner

## 2019-02-06 ENCOUNTER — Telehealth: Payer: Self-pay | Admitting: Nurse Practitioner

## 2019-02-06 ENCOUNTER — Encounter (INDEPENDENT_AMBULATORY_CARE_PROVIDER_SITE_OTHER): Payer: Self-pay

## 2019-02-06 ENCOUNTER — Other Ambulatory Visit: Payer: Self-pay

## 2019-02-06 DIAGNOSIS — I10 Essential (primary) hypertension: Secondary | ICD-10-CM

## 2019-02-06 DIAGNOSIS — U071 COVID-19: Secondary | ICD-10-CM

## 2019-02-06 NOTE — Progress Notes (Signed)
  I connected by phone with Diana [name] on 02/06/2019 at 1:52 PM to discuss the potential use of an new treatment for mild to moderate COVID-19 viral infection in non-hospitalized patients.  This patient is a 64 y.o. female that meets the FDA criteria for Emergency Use Authorization of bamlanivimab or casirivimab\imdevimab.  Has a (+) direct SARS-CoV-2 viral test result  Has mild or moderate COVID-19   Is ? 66 years of age and weighs ? 40 kg  Is NOT hospitalized due to COVID-19  Is NOT requiring oxygen therapy or requiring an increase in baseline oxygen flow rate due to COVID-19  Is within 10 days of symptom onset  Has at least one of the high risk factor(s) for progression to severe COVID-19 and/or hospitalization as defined in EUA.  Specific high risk criteria : Hypertension   I have spoken and communicated the following to the patient or parent/caregiver:  1. FDA has authorized the emergency use of bamlanivimab and casirivimab\imdevimab for the treatment of mild to moderate COVID-19 in adults and pediatric patients with positive results of direct SARS-CoV-2 viral testing who are 87 years of age and older weighing at least 40 kg, and who are at high risk for progressing to severe COVID-19 and/or hospitalization.  2. The significant known and potential risks and benefits of bamlanivimab and casirivimab\imdevimab, and the extent to which such potential risks and benefits are unknown.  3. Information on available alternative treatments and the risks and benefits of those alternatives, including clinical trials.  4. Patients treated with bamlanivimab and casirivimab\imdevimab should continue to self-isolate and use infection control measures (e.g., wear mask, isolate, social distance, avoid sharing personal items, clean and disinfect "high touch" surfaces, and frequent handwashing) according to CDC guidelines.   5. The patient or parent/caregiver has the option to accept or refuse  bamlanivimab or casirivimab\imdevimab .  After reviewing this information with the patient, The patient agreed to proceed with receiving the bamlanimivab infusion and will be provided a copy of the Fact sheet prior to receiving the infusion.Fenton Foy 02/06/2019 1:52 PM

## 2019-02-06 NOTE — Telephone Encounter (Signed)
Called to Discuss with patient about Covid symptoms and the use of bamlanivimab, a monoclonal antibody infusion for those with mild to moderate Covid symptoms and at a high risk of hospitalization.     Patient recently had COVID vaccine so she can not get MAB infusion.

## 2019-02-07 ENCOUNTER — Telehealth: Payer: Self-pay

## 2019-02-07 ENCOUNTER — Telehealth: Payer: Self-pay | Admitting: Internal Medicine

## 2019-02-07 ENCOUNTER — Encounter (INDEPENDENT_AMBULATORY_CARE_PROVIDER_SITE_OTHER): Payer: Self-pay

## 2019-02-07 MED ORDER — ONDANSETRON HCL 4 MG PO TABS: 4.0000 mg | ORAL_TABLET | Freq: Three times a day (TID) | ORAL | 0 refills | Status: DC | PRN

## 2019-02-07 NOTE — Telephone Encounter (Signed)
Patient states that she has cough syrup to control the cough. Patient states that she already on a antibiotic and has about 6 days left. Patient advise on cough as follows:   If cough remains the same or better: continue to treat with over the counter medications. Hard candy or cough drops and drinking warm fluids. Adults can also use honey 2 tsp (10 ML) at bedtime.   HONEY IS NOT RECOMMENDED FOR INFANTS UNDER ONE.   If cough is becoming worse even with the use of over the counter medications and patient is not able to sleep at night, cough becomes productive with sputum that maybe yellow or green in color, contact PCP.

## 2019-02-07 NOTE — Telephone Encounter (Signed)
Husband called. Patient is experiencing nausea. We have been following her closely for Covid-19 infection. She has been referred to infusion center.  Will call in Zofran one po q 8 hours prn nausea

## 2019-02-07 NOTE — Telephone Encounter (Signed)
Faxed positive COVID-19 lab results along with demographics to [facility] Department.

## 2019-02-08 ENCOUNTER — Ambulatory Visit (HOSPITAL_COMMUNITY)
Admission: RE | Admit: 2019-02-08 | Discharge: 2019-02-08 | Disposition: A | Discharge status: Home / Self Care | Payer: PRIVATE HEALTH INSURANCE | Source: Ambulatory Visit | Attending: Pulmonary Disease | Admitting: Pulmonary Disease

## 2019-02-08 ENCOUNTER — Encounter (INDEPENDENT_AMBULATORY_CARE_PROVIDER_SITE_OTHER): Payer: Self-pay

## 2019-02-08 DIAGNOSIS — Z23 Encounter for immunization: Secondary | ICD-10-CM | POA: Insufficient documentation

## 2019-02-08 DIAGNOSIS — U071 COVID-19: Secondary | ICD-10-CM | POA: Diagnosis present

## 2019-02-08 DIAGNOSIS — I10 Essential (primary) hypertension: Secondary | ICD-10-CM

## 2019-02-08 MED ORDER — ALBUTEROL SULFATE HFA 108 (90 BASE) MCG/ACT IN AERS: 2.0000 | INHALATION_SPRAY | Freq: Once | RESPIRATORY_TRACT | Status: DC | PRN

## 2019-02-08 MED ORDER — SODIUM CHLORIDE 0.9 % IV SOLN
700.0000 mg | Freq: Once | INTRAVENOUS | Status: AC
  Administered 2019-02-08: 700 mg via INTRAVENOUS
  Filled 2019-02-08: qty 20

## 2019-02-08 MED ORDER — EPINEPHRINE 0.3 MG/0.3ML IJ SOAJ: 0.3000 mg | Freq: Once | INTRAMUSCULAR | Status: DC | PRN

## 2019-02-08 MED ORDER — DIPHENHYDRAMINE HCL 50 MG/ML IJ SOLN: 50.0000 mg | Freq: Once | INTRAMUSCULAR | Status: DC | PRN

## 2019-02-08 MED ORDER — FAMOTIDINE IN NACL 20-0.9 MG/50ML-% IV SOLN: 20.0000 mg | Freq: Once | INTRAVENOUS | Status: DC | PRN

## 2019-02-08 MED ORDER — SODIUM CHLORIDE 0.9 % IV SOLN
INTRAVENOUS | Status: DC | PRN
  Administered 2019-02-08: 250 mL via INTRAVENOUS

## 2019-02-08 MED ORDER — ONDANSETRON HCL 4 MG/2ML IJ SOLN
INTRAMUSCULAR | Status: AC
  Administered 2019-02-08: 4 mg via INTRAVENOUS
  Filled 2019-02-08: qty 2

## 2019-02-08 MED ORDER — ONDANSETRON HCL 4 MG/2ML IJ SOLN: 4.0000 mg | Freq: Once | INTRAMUSCULAR | Status: AC

## 2019-02-08 MED ORDER — METHYLPREDNISOLONE SODIUM SUCC 125 MG IJ SOLR: 125.0000 mg | Freq: Once | INTRAMUSCULAR | Status: DC | PRN

## 2019-02-08 NOTE — Progress Notes (Signed)
  Diagnosis: COVID-19  Physician: Cresenciano Lick. Baxley, MD  Procedure: Covid Infusion Clinic Med: bamlanivimab infusion - Provided patient with bamlanimivab fact sheet for patients, parents and caregivers prior to infusion.  Complications: No immediate complications noted.  Discharge: Discharged home   Diana [name] 02/08/2019

## 2019-02-08 NOTE — Discharge Instructions (Signed)

## 2019-02-09 ENCOUNTER — Encounter (INDEPENDENT_AMBULATORY_CARE_PROVIDER_SITE_OTHER): Payer: Self-pay

## 2019-02-09 ENCOUNTER — Ambulatory Visit (INDEPENDENT_AMBULATORY_CARE_PROVIDER_SITE_OTHER): Payer: PRIVATE HEALTH INSURANCE | Admitting: Internal Medicine

## 2019-02-09 ENCOUNTER — Encounter: Payer: Self-pay | Admitting: Internal Medicine

## 2019-02-09 VITALS — Temp 98.9°F | Ht 69.0 in | Wt 302.0 lb

## 2019-02-09 DIAGNOSIS — U071 COVID-19: Secondary | ICD-10-CM

## 2019-02-09 DIAGNOSIS — I10 Essential (primary) hypertension: Secondary | ICD-10-CM

## 2019-02-09 DIAGNOSIS — R5383 Other fatigue: Secondary | ICD-10-CM

## 2019-02-09 NOTE — Progress Notes (Signed)
   Subjective:    Patient ID: Diana [name], female    DOB: 04-26-1955, 64 y.o.   MRN: [id number]  HPI Patient had infusion therapy yesterday. Diagnosed with Covid- 19 late last week. 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  Has been contacted by health department regarding Covid-19 infection and contact tracing. Not sure where she got this. There were a couple of cases in office but not in area where she worked.  Due to her current illness with COVID-19, she is seen today by interactive audio and video telecommunications.  She is identified using 2 identifiers as Diana [name], a patient in this practice.  She is agreeable to visit in this format today.  Initially she was not very ill at all but subsequently after her diagnosis became more sick.  Initially just thought she had a sinus infection but now has cough.  Reports that she is afebrile.  She has hypertension and morbid obesity.  She is monitoring pulse oximetry and it has been stable.  She qualified for infusion therapy with bamlanivimab at infusion center  Was diagnosed with COVID-19 on January 15.  Has been out of work    Reports Pulse oximetry 97%  Review of Systems no nausea vomiting or diarrhea.  Cough continues.     Objective:   Physical Exam Seen virtually today.  Looks a bit fatigued and slightly pale.  Able to give a clear concise history.  Has cough.       Assessment & Plan:  COVID-19 infection status post infusion therapy  Suggested patient remain out of work an additional week and we will reassess her situation via another Doxy visit late next week.  She is agreeable to this.  A letter will be prepared for her employer.

## 2019-02-09 NOTE — Patient Instructions (Addendum)
Patient recovering from Covid 19.  Is my opinion she needs to remain out of work an additional week.  A letter will be prepared for her employer.  Rest at home.  Drink plenty of fluids.  Touch base next week via virtual visit.

## 2019-02-10 ENCOUNTER — Encounter (INDEPENDENT_AMBULATORY_CARE_PROVIDER_SITE_OTHER): Payer: Self-pay

## 2019-02-10 ENCOUNTER — Encounter: Payer: Self-pay | Admitting: Internal Medicine

## 2019-02-11 ENCOUNTER — Encounter (INDEPENDENT_AMBULATORY_CARE_PROVIDER_SITE_OTHER): Payer: Self-pay

## 2019-02-12 ENCOUNTER — Encounter (INDEPENDENT_AMBULATORY_CARE_PROVIDER_SITE_OTHER): Payer: Self-pay

## 2019-02-13 ENCOUNTER — Encounter (INDEPENDENT_AMBULATORY_CARE_PROVIDER_SITE_OTHER): Payer: Self-pay

## 2019-02-14 ENCOUNTER — Encounter (INDEPENDENT_AMBULATORY_CARE_PROVIDER_SITE_OTHER): Payer: Self-pay

## 2019-02-15 ENCOUNTER — Encounter (INDEPENDENT_AMBULATORY_CARE_PROVIDER_SITE_OTHER): Payer: Self-pay

## 2019-02-16 ENCOUNTER — Telehealth: Payer: Self-pay | Admitting: Internal Medicine

## 2019-02-16 ENCOUNTER — Encounter (INDEPENDENT_AMBULATORY_CARE_PROVIDER_SITE_OTHER): Payer: Self-pay

## 2019-02-16 NOTE — Telephone Encounter (Signed)
scheduled

## 2019-02-16 NOTE — Telephone Encounter (Signed)
Schedule tomorrow.

## 2019-02-16 NOTE — Telephone Encounter (Signed)
Diana [name] called, she would like a virtual visit to discuss when she can go back to work.

## 2019-02-17 ENCOUNTER — Other Ambulatory Visit: Payer: Self-pay

## 2019-02-17 ENCOUNTER — Encounter: Payer: Self-pay | Admitting: Internal Medicine

## 2019-02-17 ENCOUNTER — Ambulatory Visit (INDEPENDENT_AMBULATORY_CARE_PROVIDER_SITE_OTHER): Payer: PRIVATE HEALTH INSURANCE | Admitting: Internal Medicine

## 2019-02-17 ENCOUNTER — Encounter (INDEPENDENT_AMBULATORY_CARE_PROVIDER_SITE_OTHER): Payer: Self-pay

## 2019-02-17 VITALS — BP 130/77 | Temp 98.4°F | Ht 69.0 in | Wt 302.0 lb

## 2019-02-17 DIAGNOSIS — R7302 Impaired glucose tolerance (oral): Secondary | ICD-10-CM

## 2019-02-17 DIAGNOSIS — U071 COVID-19: Secondary | ICD-10-CM | POA: Diagnosis not present

## 2019-02-17 DIAGNOSIS — E782 Mixed hyperlipidemia: Secondary | ICD-10-CM

## 2019-02-17 DIAGNOSIS — I10 Essential (primary) hypertension: Secondary | ICD-10-CM | POA: Diagnosis not present

## 2019-02-17 DIAGNOSIS — Z6841 Body Mass Index (BMI) 40.0 and over, adult: Secondary | ICD-10-CM | POA: Diagnosis not present

## 2019-02-17 DIAGNOSIS — E039 Hypothyroidism, unspecified: Secondary | ICD-10-CM

## 2019-02-17 NOTE — Progress Notes (Signed)
   Subjective:    Patient ID: Diana [name], female    DOB: August 13, 1955, 64 y.o.   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  She has been out of work since that time.  She also received infusion treatment with bamlanivimab on January 20 at infusion center.  She has recovered slowly over the past 2 weeks.  Feels that she is now able to go back to work early next week.  Of note will be prepared for her to return to work on February 1.  History of obstructive sleep apnea treated with CPAP.  History of obesity.  Initially thought she just had unusual sinus type infection.  Symptoms progressed to cough and some shortness of breath.  Was treated with Biaxin 500 mg twice daily for 10 days and Hycodan.  She has a history of hypertension.  Says she never felt all that ill but had flu like symptoms.  Works in a Materials engineer.  There had been a couple of employees that tested positive but she is not sure how she was exposed to COVID-19.  History of mixed hyperlipidemia.  History of impaired glucose tolerance but hemoglobin A1c in November was normal.  Review of Systems see above no new complaints     Objective:   Physical Exam Blood pressure 130/77 BMI 44.60 pulse oximetry 99% weight 302 pounds.  Patient seen virtually due to coronavirus pandemic.  She is agreeable to visit in this format today.  She is identified using 2 identifiers as Diana [name], a patient in this practice       Assessment & Plan:  COVID-19 infection-improved  Morbid obesity  Hypertension  History of impaired glucose tolerance  Mixed hyperlipidemia  History of sleep apnea  Plan: Patient approved to return to work on February 1.

## 2019-02-17 NOTE — Patient Instructions (Signed)
We are glad that you have improved.  Note will be prepared for you to return to work.  However continue to take good care of yourself and get plenty of rest over the next few weeks.

## 2019-02-18 ENCOUNTER — Encounter (INDEPENDENT_AMBULATORY_CARE_PROVIDER_SITE_OTHER): Payer: Self-pay

## 2019-03-20 ENCOUNTER — Telehealth: Payer: Self-pay | Admitting: Internal Medicine

## 2019-03-20 NOTE — Telephone Encounter (Signed)
Emmasophia Scioscia [phone]  Analiya called to say she still has a cough from when she had COVID back in January and would like to come in and see you on Thursday.

## 2019-03-20 NOTE — Telephone Encounter (Signed)
Patient called back and I let her know what Dr Renold Genta had suggested and that I had placed the referral.

## 2019-03-20 NOTE — Telephone Encounter (Signed)
LVM for patient to CB, referral has been placed.

## 2019-03-20 NOTE — Telephone Encounter (Signed)
We have seen a lot of this. Refer to Pulmonary

## 2019-03-26 ENCOUNTER — Other Ambulatory Visit: Payer: Self-pay | Admitting: Internal Medicine

## 2019-04-06 ENCOUNTER — Ambulatory Visit: Payer: PRIVATE HEALTH INSURANCE | Admitting: Internal Medicine

## 2019-04-06 ENCOUNTER — Other Ambulatory Visit: Payer: Self-pay

## 2019-04-06 ENCOUNTER — Telehealth: Payer: Self-pay | Admitting: Internal Medicine

## 2019-04-06 ENCOUNTER — Encounter: Payer: Self-pay | Admitting: Internal Medicine

## 2019-04-06 ENCOUNTER — Ambulatory Visit (INDEPENDENT_AMBULATORY_CARE_PROVIDER_SITE_OTHER): Payer: PRIVATE HEALTH INSURANCE

## 2019-04-06 DIAGNOSIS — R05 Cough: Secondary | ICD-10-CM

## 2019-04-06 DIAGNOSIS — R058 Other specified cough: Secondary | ICD-10-CM

## 2019-04-06 MED ORDER — HYDROCODONE-HOMATROPINE 5-1.5 MG/5ML PO SYRP: 5.0000 mL | ORAL_SOLUTION | ORAL | 0 refills | Status: DC | PRN

## 2019-04-06 MED ORDER — PREDNISONE 10 MG PO TABS: ORAL_TABLET | ORAL | 0 refills | Status: DC

## 2019-04-06 NOTE — Telephone Encounter (Signed)
Spoke with the pharmacist at [facility]. He wanted to verify the mL of the hycodan RX. Advised him that the order was sent for 459mL but MW was out of the office for me to verify. He stated that the pharmacy will only be able to prescribe 137mL at a time and they will void the rest of the RX. If he wishes to prescribe the rest, he will have to write another RX.   Nothing further needed at time of call.

## 2019-04-06 NOTE — Patient Instructions (Addendum)
Try prilosec otc 20mg  x 2 ( or protonix 40 mg)   Take 30-60 min before first meal of the day and Pepcid ac (famotidine) 20 mg one after supper  until cough is completely gone for at least a week without the need for cough suppression  GERD (REFLUX)  is an extremely common cause of respiratory symptoms just like yours , many times with no obvious heartburn at all.    It can be treated with medication, but also with lifestyle changes including elevation of the head of your bed (ideally with 6 -8inch blocks under the headboard of your bed),  Smoking cessation, avoidance of late meals, excessive alcohol, and avoid fatty foods, chocolate, peppermint, colas, red wine, and acidic juices such as orange juice.  NO MINT OR MENTHOL PRODUCTS SO NO COUGH DROPS  USE SUGARLESS CANDY INSTEAD (Jolley ranchers or Stover's or Life Savers) or even ice chips will also do - the key is to swallow to prevent all throat clearing. NO OIL BASED VITAMINS - use powdered substitutes.  Avoid fish oil when coughing.  Goal is to completely suppress the cough syrup = hycodan 1 -2 tsp every 4 hours (whatever it takes)  X  3days then use Delsym 2 tsp twice daily after that as needed   Prednisone 10 mg take  4 each am x 2 days,   2 each am x 2 days,  1 each am x 2 days and stop   Please remember to go to the  x-ray department  for your tests - we will call you with the results when they are available    Call back if not 100% better in one week.

## 2019-04-06 NOTE — Progress Notes (Signed)
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 Allergy/ ent and dx was "hypersentive nose" with 4 x per year but imiproved over time esp p Dr Renold Genta added zyrtec but since around Jan 14th  2021 sniffles/ cough 2 weeks p Pfizer vaccine but PCR Pos for Covid 19 on 02/03/19 and rx with bamlanivimab 02/08/19 but  no dex >>>  seeemed to help nausea / fatigue but not the cough, at least not immediately and only overall  50% improved since it was at its worst  so referred to pulmonary clinic 04/06/2019 by Dr   Renold Genta for refractory cough      History of Present Illness  04/06/2019  Pulmonary/ 1st office eval/Diana [name]  Chief Complaint  Patient presents with  . Pulmonary Consult    Referred by Dr. Renold Genta.  Pt c/o cough since had Covid in Jan 2021. Cough is non prod.   Dyspnea:  Not limited by breathing from desired activities   Cough: mostly dry now worse p work and before supper assoc with hoarseness  Sleep: fine now s cough better  on back with one pillow SABA use: none   No obvious day to day or daytime variability or assoc excess/ purulent sputum or mucus plugs or hemoptysis or cp or chest tightness, subjective wheeze or overt sinus or hb symptoms.   Sleeping now  without nocturnal  or early am exacerbation  of respiratory  c/o's or need for noct saba. Also denies any obvious fluctuation of symptoms with weather or environmental changes or other aggravating or alleviating factors except as outlined above   No unusual exposure hx or h/o childhood pna/ asthma or knowledge of premature birth.  Current Allergies, Complete Past Medical History, Past Surgical History, Family History, and Social History were reviewed in [employer] record.  ROS  The following are not active complaints  unless bolded Hoarseness, sore throat, dysphagia, dental problems, itching, sneezing,  nasal congestion or discharge of excess mucus or purulent secretions, ear ache,   fever, chills, sweats, unintended wt loss or wt gain, classically pleuritic or exertional cp,  orthopnea pnd or arm/hand swelling  or leg swelling, presyncope, palpitations, abdominal pain, anorexia, nausea, vomiting, diarrhea  or change in bowel habits or change in bladder habits, change in stools or change in urine, dysuria, hematuria,  rash, arthralgias, visual complaints, headache, numbness, weakness or ataxia or problems with walking or coordination,  change in mood or  memory.              Kouffman Reflux v Neurogenic Cough Differentiator Reflux Comments  Do you awaken from a sound sleep coughing violently?                            With trouble breathing? No No on cpap   Do you have choking episodes when you cannot  Get enough air, gasping for air ?              No   Do you usually cough when you lie down into  The bed, or when you just lie down to rest ?  no   Do you usually cough after meals or eating?         no   Do you cough when (or after) you bend over?    no   GERD SCORE     Kouffman Reflux v Neurogenic Cough Differentiator Neurogenic   Do you more-or-less cough all day long? Sporadic    Does change of temperature make you cough? Warm air worse   Does laughing or chuckling cause you to cough? yes   Do fumes (perfume, automobile fumes, burned  Toast, etc.,) cause you to cough ?      no   Does speaking, singing, or talking on the phone cause you to cough   ?               speaking   Neurogenic/Airway score       Past Medical History:  Diagnosis Date  . Anxiety    controlled  . Atrial septal defect    Small,non significant atrial septal defect with QP/QS of 1.25 by MRA.  . Cancer ([place])    melanoma left leg 2012  . Complication of anesthesia    gets migraine when she has to be  NPO  . Depression   . Hypertension    Pt doesn't take meds and denies having BP issues however her diostolic in alway in high 80s -90s  . Hypothyroidism   . Migraine    occasionally  . Migraine headache   . Obesity   . Obstructive apnea    CPAP user since 2012.   Marland Kitchen PONV (postoperative nausea and vomiting)    n/v from migraine, not anesthesia  . Primary localized osteoarthritis of left knee 08/02/2018  . Pseudotumor cerebri Diagnosed in March 2008-   She sees Dr Clovis Riley in this regard  . Statin intolerance    with elevated CRP    Outpatient Medications Prior to Visit  Medication Sig Dispense Refill  . ALPRAZolam (XANAX) 0.5 MG tablet Take 0.5 mg by mouth daily as needed for anxiety.  0  . amLODipine (NORVASC) 5 MG tablet Take 1 tablet (5 mg total) by mouth daily. 30 tablet 5  . cetirizine (ZYRTEC) 10 MG tablet Take 10 mg by mouth daily.      Marland Kitchen levothyroxine (SYNTHROID) 50 MCG tablet TAKE 1 TABLET BY MOUTH DAILY. 90 tablet 1  . ondansetron (ZOFRAN) 4 MG tablet Take 1 tablet (4 mg total) by mouth every 8 (eight) hours as needed for nausea or vomiting. 20 tablet 0  . PRISTIQ 100 MG 24 hr tablet Take 100 mg by mouth daily.  1  . SUMAtriptan (IMITREX) 100 MG tablet TAKE 1 TABLET BY MOUTH ONCE. MAY REPEAT IN 2 HOURS IF HEADACHE PERSISTS OR RECURS. MAX OF 2 TABLETS IN 24 HOURS 27 tablet 2  . clarithromycin (BIAXIN) 500 MG tablet Did not finish rx   0  . HYDROcodone-homatropine (HYCODAN) 5-1.5 MG/5ML syrup Take 5 mLs by mouth every 8 (eight) hours as needed for cough. 120 mL 0      Objective:     BP (!) 162/84 (BP Location: Left Arm, Cuff Size: Large)   Pulse 100   Temp (!) 97.3 F (36.3 C) (Temporal)   Ht 5\' 9"  (1.753 m)   Wt (!) 313 lb 6.4 oz (142.2 kg)   SpO2 98% Comment: on RA  BMI 46.28 kg/m   SpO2: 98 %(on RA)   amb wf occ throat clearing   HEENT : pt wearing mask not removed  for exam due to covid -19 concerns.    NECK :  without JVD/Nodes/TM/ nl carotid upstrokes  bilaterally   LUNGS: no acc muscle use,  Nl contour chest which is clear to A and P bilaterally without cough on insp or exp maneuvers   CV:  RRR  no s3 or murmur or increase in P2, and no edema   ABD:  soft and nontender with nl inspiratory excursion in the supine position. No bruits or organomegaly appreciated, bowel sounds nl  MS:  Nl gait/ ext warm without deformities, calf tenderness, cyanosis or clubbing No obvious joint restrictions   SKIN: warm and dry without lesions    NEURO:  alert, approp, nl sensorium with  no motor or cerebellar deficits apparent.      CXR PA and Lateral:   04/06/2019 :    I personally reviewed images and agree with radiology impression as follows:   No active cardiopulmonary disease.     Assessment   Upper airway cough syndrome Flare Jan 2021 p covid 19 - cyclical cough rx [id number] >>>    Of the three most common causes of  Sub-acute / recurrent or chronic cough, only one (GERD)  can actually contribute to/ trigger  the other two (asthma and post nasal drip syndrome)  and perpetuate the cylce of cough.  While not intuitively obvious, many patients with chronic low grade reflux do not cough until there is a primary insult that disturbs the protective epithelial barrier and exposes sensitive nerve endings.   This is typically  Post  viral but can due to PNDS and  either may apply here.     >>> The point is that once this occurs, it is difficult to eliminate the cycle  using anything but a maximally effective acid suppression regimen at least in the short run, accompanied by an appropriate diet to address non acid GERD and control / eliminate the cough itself for at least 3 days with narcotic cough meds like hycodan titrated up to where she doesn't even have the urge to cough/ advised and also added 6 days of Prednisone in case of component of Th-2 driven upper or lower airways inflammation (if cough responds short term only to relapse befor return  while will on rx for uacs that would point to allergic rhinitis/ asthma or eos bronchitis) .          Each maintenance medication was reviewed in detail including emphasizing most importantly the difference between maintenance and prns and under what circumstances the prns are to be triggered using an action plan format where appropriate.  Total time for H and P, chart review, counseling,   and generating customized AVS unique to this office visit / charting = 45 min             Christinia Gully, MD 04/06/2019

## 2019-04-06 NOTE — Assessment & Plan Note (Addendum)
Flare Jan 2021 p covid 19 - cyclical cough rx [id number] >>>   Of the three most common causes of  Sub-acute / recurrent or chronic cough, only one (GERD)  can actually contribute to/ trigger  the other two (asthma and post nasal drip syndrome)  and perpetuate the cylce of cough.  While not intuitively obvious, many patients with chronic low grade reflux do not cough until there is a primary insult that disturbs the protective epithelial barrier and exposes sensitive nerve endings.   This is typically  Post  viral but can due to PNDS and  either may apply here.     >>> The point is that once this occurs, it is difficult to eliminate the cycle  using anything but a maximally effective acid suppression regimen at least in the short run, accompanied by an appropriate diet to address non acid GERD and control / eliminate the cough itself for at least 3 days with narcotic cough meds like hycodan titrated up to where she doesn't even have the urge to cough/ advised and also added 6 days of Prednisone in case of component of Th-2 driven upper or lower airways inflammation (if cough responds short term only to relapse befor return while will on rx for uacs that would point to allergic rhinitis/ asthma or eos bronchitis) .          Each maintenance medication was reviewed in detail including emphasizing most importantly the difference between maintenance and prns and under what circumstances the prns are to be triggered using an action plan format where appropriate.  Total time for H and P, chart review, counseling,   and generating customized AVS unique to this office visit / charting = 45 min

## 2019-04-24 ENCOUNTER — Other Ambulatory Visit: Payer: Self-pay | Admitting: Internal Medicine

## 2019-05-25 LAB — LIPID PANEL
Chol/HDL Ratio: 6.7 ratio — ABNORMAL HIGH (ref 0.0–4.4)
Cholesterol, Total: 256 mg/dL — ABNORMAL HIGH (ref 100–199)
HDL: 38 mg/dL — ABNORMAL LOW (ref >39)
LDL Chol Calc (NIH): 176 mg/dL — ABNORMAL HIGH (ref 0–99)
Triglycerides: 221 mg/dL — ABNORMAL HIGH (ref 0–149)
VLDL Cholesterol Cal: 42 mg/dL — ABNORMAL HIGH (ref 5–40)

## 2019-06-01 ENCOUNTER — Ambulatory Visit: Payer: PRIVATE HEALTH INSURANCE | Admitting: Internal Medicine

## 2019-06-01 ENCOUNTER — Other Ambulatory Visit: Payer: Self-pay

## 2019-06-01 ENCOUNTER — Encounter: Payer: Self-pay | Admitting: Internal Medicine

## 2019-06-01 VITALS — BP 174/92 | HR 106 | Ht 69.0 in | Wt 309.0 lb

## 2019-06-01 DIAGNOSIS — E782 Mixed hyperlipidemia: Secondary | ICD-10-CM | POA: Diagnosis not present

## 2019-06-01 DIAGNOSIS — Z789 Other specified health status: Secondary | ICD-10-CM

## 2019-06-01 NOTE — Patient Instructions (Signed)
Medication Instructions:  Your physician recommends that you continue on your current medications as directed. Please refer to the Current Medication list given to you today.  *If you need a refill on your cardiac medications before your next appointment, please call your pharmacy*   Lab Work: FASTING lab work in 6 months to check cholesterol  Please complete about 1 week before next lipid clinic appointment  If you have labs (blood work) drawn today and your tests are completely normal, you will receive your results only by: Marland Kitchen MyChart Message (if you have MyChart) OR . A paper copy in the mail If you have any lab test that is abnormal or we need to change your treatment, we will call you to review the results.   Testing/Procedures: NONE   Follow-Up: At [facility], you and your health needs are our priority.  As part of our continuing mission to provide you with exceptional heart care, we have created designated Provider Care Teams.  These Care Teams include your primary Cardiologist (physician) and Advanced Practice Providers (APPs -  Physician Assistants and Nurse Practitioners) who all work together to provide you with the care you need, when you need it.  We recommend signing up for the patient portal called "MyChart".  Sign up information is provided on this After Visit Summary.  MyChart is used to connect with patients for Virtual Visits (Telemedicine).  Patients are able to view lab/test results, encounter notes, upcoming appointments, etc.  Non-urgent messages can be sent to your provider as well.   To learn more about what you can do with MyChart, go to [email or web address].    Your next appointment:   6 month(s) - lipid clinic  The format for your next appointment:   Either In Person or Virtual  Provider:   K. Mali Hilty, MD   Other Instructions

## 2019-06-01 NOTE — Progress Notes (Signed)
OFFICE NOTE  Chief Complaint:  Follow-up lipids  Primary Care Physician: Elby Showers, MD  HPI:  Diana [name] is a 64 y.o. female seen today for lipid evaluation at the request of Dr. Renold Genta.  She is a pleasant 64 year old female who was remotely followed by Dr. Doreatha Lew and then Dr. Burt Knack for a small secundum atrial septal defect.  She has not had this repaired.  Other medical problems include morbid obesity, obstructive sleep apnea on CPAP and hypothyroidism.  She denies any significant history of hypertension, diabetes and no real significant coronary disease history.  Both of her parents she has had lived into their 56s.  He does have a history of dyslipidemia which is in her assessment primarily diet driven.  She said many years ago did her cholesterol was much lower about 150 for total cholesterol however it subsequently has risen with significant weight gain.  She is also having problems ambulating and has an upcoming knee surgery scheduled.  She hopes to work on additional weight loss and dietary changes after that.  She says she is under a lot of stress and is a "stress eater".  She is trying a number of commercial diets.  Profile a month ago showed total cholesterol 253, HDL 40, triglycerides 223 and LDL of 175.  This represents mixed dyslipidemia and typical pattern for metabolic syndrome.  This is also an atherogenic lipid profile.  She has previously taken rosuvastatin which caused significant myalgias and recently was on pravastatin which she said caused sleep disturbance and was not tolerated.  She does not have any known coronary disease history.  She did have a vascular screen profile done a couple of years ago which showed some mild right internal carotid plaque.  11/03/2018  Diana [name] returns today for follow-up.  We did perform a calcium score which was 0.  Overall I think this gives [place] some reassurance with regards to her lipids however if she persistently has elevated lipids  then she will be at risk for coronary disease in the future.  I would now recommend aggressive work on her diet and weight loss.  She has been working with a dietitian however is gained about 15 pounds since I last saw her.  She says her "body is out of whack".  Hopefully this can be better controlled.  06/01/2019  Diana [name] returns today for follow-up.  He has had several health issues over the past several months including coronavirus in January.  She received antibiotic therapy for this.  She had a ready received her initial Pfizer vaccine.  She feels like just now she started to recover from this.  Unfortunately weight has not come down a lot and her cholesterol profile is essentially unchanged with LDL of 176.  We discussed therapeutic options however I do not think that she is really had an opportunity to work on the weight loss and dietary changes she needs to do.  PMHx:  Past Medical History:  Diagnosis Date  . Anxiety    controlled  . Atrial septal defect    Small,non significant atrial septal defect with QP/QS of 1.25 by MRA.  . Cancer ([place])    melanoma left leg 2012  . Complication of anesthesia    gets migraine when she has to be NPO  . Depression   . Hypertension    Pt doesn't take meds and denies having BP issues however her diostolic in alway in high 80s -90s  . Hypothyroidism   . Migraine  occasionally  . Migraine headache   . Obesity   . Obstructive apnea    CPAP user since 2012.   Marland Kitchen PONV (postoperative nausea and vomiting)    n/v from migraine, not anesthesia  . Primary localized osteoarthritis of left knee 08/02/2018  . Pseudotumor cerebri Diagnosed in March 2008-   She sees Dr Clovis Riley in this regard  . Statin intolerance    with elevated CRP    Past Surgical History:  Procedure Laterality Date  . BACK SURGERY    . BUNIONECTOMY    . FRACTURE SURGERY     left 5th metatarsal  . MASS EXCISION Left 03/22/2018   Procedure: EXCISION CYST DEBRIDEMENT DISTAL  INTERPHALANGEAL LEFT MIDDLE FINGER;  Surgeon: Daryll Brod, MD;  Location: [facility];  Service: Orthopedics;  Laterality: Left;  FAB  . ORTHOPEDIC SURGERY     lumbar surgery   . PARTIAL KNEE ARTHROPLASTY Right 09/15/2016   Procedure: UNICOMPARTMENTAL KNEE;  Surgeon: Marchia Bond, MD;  Location: [facility];  Service: Orthopedics;  Laterality: Right;  . PARTIAL KNEE ARTHROPLASTY Left 08/02/2018   Procedure: UNICOMPARTMENTAL KNEE;  Surgeon: Marchia Bond, MD;  Location: [facility];  Service: Orthopedics;  Laterality: Left;  . pin in the fifth metatarsal    . right knee arthroscopy      FAMHx:  Family History  Problem Relation Age of Onset  . Alzheimer's disease Mother   . Rheumatic fever Father     SOCHx:   reports that she has never smoked. She has never used smokeless tobacco. She reports current alcohol use. She reports that she does not use drugs.  ALLERGIES:  Allergies  Allergen Reactions  . Augmentin [Amoxicillin-Pot Clavulanate] Nausea And Vomiting and Other (See Comments)    Has patient had a PCN reaction causing immediate rash, facial/tongue/throat swelling, SOB or lightheadedness with hypotension: No Has patient had a PCN reaction causing severe rash involving mucus membranes or skin necrosis: No Has patient had a PCN reaction that required hospitalization: No Has patient had a PCN reaction occurring within the last 10 years: #  #  #  YES  #  #  #  > 2014 If all of the above answers are "NO", then may proceed with Cephalosporin use.     ROS: Pertinent items noted in HPI and remainder of comprehensive ROS otherwise negative.  HOME MEDS: Current Outpatient Medications on File Prior to Visit  Medication Sig Dispense Refill  . ALPRAZolam (XANAX) 0.5 MG tablet Take 0.5 mg by mouth daily as needed for anxiety.  0  . amLODipine (NORVASC) 5 MG tablet TAKE 1 TABLET BY MOUTH DAILY. 90 tablet 0  . cetirizine (ZYRTEC) 10 MG tablet Take 10 mg by mouth daily.      Marland Kitchen  HYDROcodone-homatropine (HYCODAN) 5-1.5 MG/5ML syrup Take 5 mLs by mouth every 4 (four) hours as needed for cough. 473 mL 0  . levothyroxine (SYNTHROID) 50 MCG tablet TAKE 1 TABLET BY MOUTH DAILY. 90 tablet 1  . ondansetron (ZOFRAN) 4 MG tablet Take 1 tablet (4 mg total) by mouth every 8 (eight) hours as needed for nausea or vomiting. 20 tablet 0  . predniSONE (DELTASONE) 10 MG tablet Take  4 each am x 2 days,   2 each am x 2 days,  1 each am x 2 days and stop 14 tablet 0  . PRISTIQ 100 MG 24 hr tablet Take 100 mg by mouth daily.  1  . SUMAtriptan (IMITREX) 100 MG tablet TAKE 1 TABLET  BY MOUTH ONCE. MAY REPEAT IN 2 HOURS IF HEADACHE PERSISTS OR RECURS. MAX OF 2 TABLETS IN 24 HOURS 27 tablet 2   No current facility-administered medications on file prior to visit.    LABS/IMAGING: No results found for this or any previous visit (from the past 48 hour(s)). No results found.  LIPID PANEL:    Component Value Date/Time   CHOL 256 (H) 05/25/2019 1133   TRIG 221 (H) 05/25/2019 1133   HDL 38 (L) 05/25/2019 1133   CHOLHDL 6.7 (H) 05/25/2019 1133   CHOLHDL 6.1 (H) 12/01/2018 1138   VLDL 35 (H) 02/13/2016 1115   LDLCALC 176 (H) 05/25/2019 1133   LDLCALC 172 (H) 12/01/2018 1138     WEIGHTS: Wt Readings from Last 3 Encounters:  06/01/19 (!) 309 lb (140.2 kg)  04/06/19 (!) 313 lb 6.4 oz (142.2 kg)  02/17/19 (!) 302 lb (137 kg)    VITALS: BP (!) 174/92   Pulse (!) 106   Ht 5\' 9"  (1.753 m)   Wt (!) 309 lb (140.2 kg)   SpO2 96%   BMI 45.63 kg/m   EXAM: Deferred  EKG: deferred  ASSESSMENT: 1. Mixed dyslipidemia - zero CAC score (07/2018) 2. Statin intolerance 3. Morbid obesity 4. Mild carotid artery disease 5. History of small secundum ASD status post repair  PLAN: 1.   Diana [name] will again have a 58-month.  To work on diet and exercise changes to see if she could lose weight and improve her lipid profile.  At the end of that time.  If her numbers are not significantly changed,  would recommend likely another shot at therapy.  We may consider an alternative statin, or one of the newer oral agents.  I do not think it is likely she will qualify for a PCSK9 inhibitor given no coronary calcium.  Pixie Casino, MD, [facility], [place] Director of the Advanced Lipid Disorders &  Cardiovascular Risk Reduction Clinic Diplomate of the American Board of Clinical Lipidology Attending Cardiologist  Direct Dial: [phone]  Fax: [phone]  Website:  [email or web address] 06/01/2019, 1:30 PM

## 2019-06-14 ENCOUNTER — Encounter: Payer: Self-pay | Admitting: Family Medicine

## 2019-06-29 ENCOUNTER — Other Ambulatory Visit: Payer: Self-pay | Admitting: Internal Medicine

## 2019-06-29 DIAGNOSIS — Z1231 Encounter for screening mammogram for malignant neoplasm of breast: Secondary | ICD-10-CM

## 2019-07-06 ENCOUNTER — Ambulatory Visit: Payer: PRIVATE HEALTH INSURANCE | Admitting: Family Medicine

## 2019-07-20 ENCOUNTER — Ambulatory Visit: Payer: PRIVATE HEALTH INSURANCE | Admitting: Family Medicine

## 2019-07-20 ENCOUNTER — Other Ambulatory Visit: Payer: Self-pay | Admitting: Internal Medicine

## 2019-07-20 ENCOUNTER — Encounter: Payer: Self-pay | Admitting: Family Medicine

## 2019-07-20 VITALS — BP 130/82 | HR 89 | Ht 69.0 in | Wt 310.0 lb

## 2019-07-20 DIAGNOSIS — Z9989 Dependence on other enabling machines and devices: Secondary | ICD-10-CM | POA: Diagnosis not present

## 2019-07-20 DIAGNOSIS — G4733 Obstructive sleep apnea (adult) (pediatric): Secondary | ICD-10-CM

## 2019-07-20 NOTE — Patient Instructions (Signed)
Everything looks great!    Please continue using your CPAP regularly. While your insurance requires that you use CPAP at least 4 hours each night on 70% of the nights, I recommend, that you not skip any nights and use it throughout the night if you can. Getting used to CPAP and staying with the treatment long term does take time and patience and discipline. Untreated obstructive sleep apnea when it is moderate to severe can have an adverse impact on cardiovascular health and raise her risk for heart disease, arrhythmias, hypertension, congestive heart failure, stroke and diabetes. Untreated obstructive sleep apnea causes sleep disruption, nonrestorative sleep, and sleep deprivation. This can have an impact on your day to day functioning and cause daytime sleepiness and impairment of cognitive function, memory loss, mood disturbance, and problems focussing. Using CPAP regularly can improve these symptoms.   Follow up in 1 year   Sleep Apnea Sleep apnea affects breathing during sleep. It causes breathing to stop for a short time or to become shallow. It can also increase the risk of:  Heart attack.  Stroke.  Being very overweight (obese).  Diabetes.  Heart failure.  Irregular heartbeat. The goal of treatment is to help you breathe normally again. What are the causes? There are three kinds of sleep apnea:  Obstructive sleep apnea. This is caused by a blocked or collapsed airway.  Central sleep apnea. This happens when the brain does not send the right signals to the muscles that control breathing.  Mixed sleep apnea. This is a combination of obstructive and central sleep apnea. The most common cause of this condition is a collapsed or blocked airway. This can happen if:  Your throat muscles are too relaxed.  Your tongue and tonsils are too large.  You are overweight.  Your airway is too small. What increases the risk?  Being overweight.  Smoking.  Having a small  airway.  Being older.  Being female.  Drinking alcohol.  Taking medicines to calm yourself (sedatives or tranquilizers).  Having family members with the condition. What are the signs or symptoms?  Trouble staying asleep.  Being sleepy or tired during the day.  Getting angry a lot.  Loud snoring.  Headaches in the morning.  Not being able to focus your mind (concentrate).  Forgetting things.  Less interest in sex.  Mood swings.  Personality changes.  Feelings of sadness (depression).  Waking up a lot during the night to pee (urinate).  Dry mouth.  Sore throat. How is this diagnosed?  Your medical history.  A physical exam.  A test that is done when you are sleeping (sleep study). The test is most often done in a sleep lab but may also be done at home. How is this treated?   Sleeping on your side.  Using a medicine to get rid of mucus in your nose (decongestant).  Avoiding the use of alcohol, medicines to help you relax, or certain pain medicines (narcotics).  Losing weight, if needed.  Changing your diet.  Not smoking.  Using a machine to open your airway while you sleep, such as: ? An oral appliance. This is a mouthpiece that shifts your lower jaw forward. ? A CPAP device. This device blows air through a mask when you breathe out (exhale). ? An EPAP device. This has valves that you put in each nostril. ? A BPAP device. This device blows air through a mask when you breathe in (inhale) and breathe out.  Having surgery if other  treatments do not work. It is important to get treatment for sleep apnea. Without treatment, it can lead to:  High blood pressure.  Coronary artery disease.  In men, not being able to have an erection (impotence).  Reduced thinking ability. Follow these instructions at home: Lifestyle  Make changes that your doctor recommends.  Eat a healthy diet.  Lose weight if needed.  Avoid alcohol, medicines to help you  relax, and some pain medicines.  Do not use any products that contain nicotine or tobacco, such as cigarettes, e-cigarettes, and chewing tobacco. If you need help quitting, ask your doctor. General instructions  Take over-the-counter and prescription medicines only as told by your doctor.  If you were given a machine to use while you sleep, use it only as told by your doctor.  If you are having surgery, make sure to tell your doctor you have sleep apnea. You may need to bring your device with you.  Keep all follow-up visits as told by your doctor. This is important. Contact a doctor if:  The machine that you were given to use during sleep bothers you or does not seem to be working.  You do not get better.  You get worse. Get help right away if:  Your chest hurts.  You have trouble breathing in enough air.  You have an uncomfortable feeling in your back, arms, or stomach.  You have trouble talking.  One side of your body feels weak.  A part of your face is hanging down. These symptoms may be an emergency. Do not wait to see if the symptoms will go away. Get medical help right away. Call your local emergency services (911 in the [place] Do not drive yourself to the hospital. Summary  This condition affects breathing during sleep.  The most common cause is a collapsed or blocked airway.  The goal of treatment is to help you breathe normally while you sleep. This information is not intended to replace advice given to you by your health care provider. Make sure you discuss any questions you have with your health care provider. Document Revised: 10/22/2017 Document Reviewed: 08/31/2017 Elsevier Patient Education  [employer].

## 2019-07-20 NOTE — Progress Notes (Signed)
Order for cpap supplies sent to [facility] via community msg. Confirmation received that the order transmitted was successful.  

## 2019-07-20 NOTE — Progress Notes (Signed)
PATIENT: Diana [name] DOB: [birth year]  REASON FOR VISIT: follow up HISTORY FROM: patient  Chief Complaint  Patient presents with  . Follow-up    75yr f/u for OSA. States she has been using her machine and mask with no issues.   . room 9    alone     HISTORY OF PRESENT ILLNESS: Today 07/20/19 Diana [name] is a 64 y.o. female here today for follow up for OSA on CPAP.  She is doing very well with CPAP therapy.  She uses her machine every night.  She denies any concerns or issues with her machine.  Compliance report dated 06/19/2019 through 07/18/2019 reveals that she used CPAP every day for compliance of 100%.  She used CPAP greater than 4 hours every day.  Average usage was 7 hours and 13 minutes.  Residual AHI was 0.7 on 8 to 15 cm of water and an EPR of 1.  There was no significant leak noted.  She is feeling well today and without concerns.  HISTORY: (copied from my note on 07/04/2018)  Diana [name] is a 64 y.o. female here today for follow up of OSA on CPAP.  She is doing very well with CPAP therapy.  She is using her machine every night.  She states that she cannot sleep without it.  She is in need of new supplies.  06/04/2018 - 07/03/2018 07/03/2018 Usage days 30/30 days (100%) >= 4 hours 30 days (100%) < 4 hours 0 days (0%) Usage hours 231 hours 31 minutes Average usage (total days) 7 hours 43 minutes Average usage (days used) 7 hours 43 minutes Median usage (days used) 7 hours 43 minutes Total used hours (value since last reset - 07/03/2018) 7,298 hours AirSense 10 AutoSet Serial number [id number] Mode AutoSet Min Pressure 8 cmH2O Max Pressure 15 cmH2O EPR Fulltime EPR level 1 Response Standard Therapy Pressure - cmH2O Median: 9.0 95th percentile: 11.4 Maximum: 12.9 Leaks - L/min Median: 1.0 95th percentile: 13.2 Maximum: 21.4 Events per hour AI: 1.3 HI: 0.0 AHI: 1.3 Apnea Index Central: 0.1 Obstructive: 1.2 Unknown: 0.0 RERA Index 0.0 Cheyne-Stokes  respiration (average duration per night) 0 minutes (0%)    History (copied from Dr Edwena Felty note on 04/08/2017)  Diana [name] is a 64 y.o. female [facility] a [facility] from Dr. Caprice Beaver. She used to follow Dr. Erling Cruz, who signed her CPAP orders, but she was never scheduled for a formal sleep evaluation.  Mrs. Berk underwent a split study on 9-28 2012. She has seen me yearly since then and has an AutoSet CPAP machine. Her sleep habits have not changed. She still not taking naps, she is not a shift worker, she usually goes to bed between 10 and 11 PM and rises at 6.00 in the morning, Most of the nights she will need an alarm to wake up in the morning. She will be received fresh and restored after a nocturnal sleep of about 7-7-1/2 hours.  07-29-12 for a followupvisit : The patient used the auto titrator between . Average daily usage was 7 hours and 42 minutes, which is a very good result. Residual apnea was 0.9 or. She had very minor air leaks and her 95th percentile pressure on CPAP was 10 cm the window or for this Ultracet was between 7 and 15 cm ater she endorsed the Epworth sleepiness score it only took points today. I would like for her to have the machine set at 10 cm water with a  peak TR function. The patient has always been compliant with the CPAP but this is in [place] port at the CPAP actually has reduced her apnea to a single-digit orbital single-digit level. She's 100% compliant. She had been placed on an auto-titrator before the SPLIT study and slept well with it. She tried changing to a different sleep setting of 9 cm water, and resumed afterwards her old autoset CPAP machine.  The patient never had a tonsillectomy. During her youth on [place] used a Health and safety inspector for retrognathia. She has no TMJ. She has a deformed anterior chest wall, Her estimated nocturnal sleep time about 7 hours. She does not complain of nocturia, as no breakthrough snoring or  witnessed apneas.  The patient's on the cardiology history is a patent foramen ovale, she has or a body weight of 239 pounds at 5 feet 8 inches. Since the last sleep study she first gained over 60 pounds and lost already 25. College years the patient has never worked shift work again., She does not take daytime naps. The patient is using her 64 year old autotitrator machine. She received a new one after her sleep study end of 2012 but was unable to tolerate it.  Fam. history: Her father has a history of excessive daytime sleepiness loud snoring and she witnessed several times apneas , also he was never formally diagnosed. He lived to be 95.  09-11-2015 Diana [name] presents today brokenhearted as her CPAP machine has a tight. He has used a AutoSet advantage the S8 machine for over 9 years. She has been a very compliant CPAP user with 100% compliance up to early August 2017 average user time of 7 hours 11 minutes, AutoSet is allowing pressures between 10 and 15 cm water was 1 cm EPR and the residual AHI was 1.2. Air leaks were very mild 91st percentile pressure was 12 cm water. I will sign a prescription and [facility] I would like her to have an air sense machine with an O2 sat between 8 and 15 cm water and 1 cm EPR. I will ask Graciella Belton to set the machine for her. She endorsed today the Epworth sleepiness score at 2 points, she does not endorse a high fatigue, she is not clinically depressed. She just would like to start CPAP.   Interval history on 04/02/2016, Diana [name] is here for her regular CPAP compliance visits and she has done excellent. 100% compliant 7 hours and 3 minutes average user time for the last 30 days, she's using an AutoSet between 8 and 15 cm water, and the 95th percentile pressure is 13.1 cm at night. The residual apneas is 0.6 per hour. Her husband believes that he heard some breakthrough snoring at night. 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  Interval history from 08 April 2017, I have the pleasure of meeting today with Diana [name], a23 year old Caucasian CPAP user,with a 93% compliance for the last 30 days. Average use of time 6 hours and 55 minutes on CPAP nightly, the machine is an AutoSet between 8 and 15 cmH2O pressure was 1 cm expiratory pressure relief. Residual AHI is 0.7, 95th percentile pressure is 10.8, there are no central apneas emerging, there is no high air leak. The patient has benefited from CPAP therapy as her Epworth sleepiness score is endorsed at only 2 points and her fatigue severity at 10 points. She has noted how bad she felt  when the power failed and she was without CPAP. She has a second, older CPAP machine at her second residence -   REVIEW OF SYSTEMS: Out of a complete 14 system review of symptoms, the patient complains only of the following symptoms, none and all other reviewed systems are negative.  ESS: 2 FSS 22  ALLERGIES: Allergies  Allergen Reactions  . Augmentin [Amoxicillin-Pot Clavulanate] Nausea And Vomiting and Other (See Comments)    Has patient had a PCN reaction causing immediate rash, facial/tongue/throat swelling, SOB or lightheadedness with hypotension: No Has patient had a PCN reaction causing severe rash involving mucus membranes or skin necrosis: No Has patient had a PCN reaction that required hospitalization: No Has patient had a PCN reaction occurring within the last 10 years: #  #  #  YES  #  #  #  > 2014 If all of the above answers are "NO", then may proceed with Cephalosporin use.     HOME MEDICATIONS: Outpatient Medications Prior to Visit  Medication Sig Dispense Refill  . ALPRAZolam (XANAX) 0.5 MG tablet Take 0.5 mg by mouth daily as needed for anxiety.  0  . amLODipine (NORVASC) 5 MG tablet TAKE 1 TABLET BY MOUTH DAILY. 90 tablet 0  . cetirizine (ZYRTEC) 10 MG tablet Take 10 mg by mouth daily.      Marland Kitchen levothyroxine (SYNTHROID)  50 MCG tablet TAKE 1 TABLET BY MOUTH DAILY. 90 tablet 1  . PRISTIQ 100 MG 24 hr tablet Take 100 mg by mouth daily.  1  . SUMAtriptan (IMITREX) 100 MG tablet TAKE 1 TABLET BY MOUTH ONCE. MAY REPEAT IN 2 HOURS IF HEADACHE PERSISTS OR RECURS. MAX OF 2 TABLETS IN 24 HOURS 27 tablet 2  . HYDROcodone-homatropine (HYCODAN) 5-1.5 MG/5ML syrup Take 5 mLs by mouth every 4 (four) hours as needed for cough. 473 mL 0  . ondansetron (ZOFRAN) 4 MG tablet Take 1 tablet (4 mg total) by mouth every 8 (eight) hours as needed for nausea or vomiting. 20 tablet 0  . predniSONE (DELTASONE) 10 MG tablet Take  4 each am x 2 days,   2 each am x 2 days,  1 each am x 2 days and stop 14 tablet 0   No facility-administered medications prior to visit.    PAST MEDICAL HISTORY: Past Medical History:  Diagnosis Date  . Anxiety    controlled  . Atrial septal defect    Small,non significant atrial septal defect with QP/QS of 1.25 by MRA.  . Cancer ([place])    melanoma left leg 2012  . Complication of anesthesia    gets migraine when she has to be NPO  . Depression   . Hypertension    Pt doesn't take meds and denies having BP issues however her diostolic in alway in high 80s -90s  . Hypothyroidism   . Migraine    occasionally  . Migraine headache   . Obesity   . Obstructive apnea    CPAP user since 2012.   Marland Kitchen PONV (postoperative nausea and vomiting)    n/v from migraine, not anesthesia  . Primary localized osteoarthritis of left knee 08/02/2018  . Pseudotumor cerebri Diagnosed in March 2008-   She sees Dr Clovis Riley in this regard  . Statin intolerance    with elevated CRP    PAST SURGICAL HISTORY: Past Surgical History:  Procedure Laterality Date  . BACK SURGERY    . BUNIONECTOMY    . FRACTURE SURGERY     left 5th  metatarsal  . MASS EXCISION Left 03/22/2018   Procedure: EXCISION CYST DEBRIDEMENT DISTAL INTERPHALANGEAL LEFT MIDDLE FINGER;  Surgeon: Daryll Brod, MD;  Location: [facility];  Service:  Orthopedics;  Laterality: Left;  FAB  . ORTHOPEDIC SURGERY     lumbar surgery   . PARTIAL KNEE ARTHROPLASTY Right 09/15/2016   Procedure: UNICOMPARTMENTAL KNEE;  Surgeon: Marchia Bond, MD;  Location: [facility];  Service: Orthopedics;  Laterality: Right;  . PARTIAL KNEE ARTHROPLASTY Left 08/02/2018   Procedure: UNICOMPARTMENTAL KNEE;  Surgeon: Marchia Bond, MD;  Location: [facility];  Service: Orthopedics;  Laterality: Left;  . pin in the fifth metatarsal    . right knee arthroscopy      FAMILY HISTORY: Family History  Problem Relation Age of Onset  . Alzheimer's disease Mother   . Rheumatic fever Father     SOCIAL HISTORY: Social History   Socioeconomic History  . Marital status: Married    Spouse name: Jeneen Rinks  . Number of children: 0  . Years of education: masters  . Highest education level: Not on file  Occupational History    Employer: [employer]    Comment: [employer]  Tobacco Use  . Smoking status: Never Smoker  . Smokeless tobacco: Never Used  Vaping Use  . Vaping Use: Never used  Substance and Sexual Activity  . Alcohol use: Yes    Comment: one glass of wine on weekends  . Drug use: No  . Sexual activity: Not on file  Other Topics Concern  . 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 She was referred for evaluation of what was previously been diagnosed as an ASD in 2001 at [facility]. She was advised to have it followed medically. She is marrried and lives with her spouse . She may have two glasses  of wine a week. She does not use tobacco. She sleeps with a CPAP.   Caffeine Use: 1-2 cups daily   Social Determinants of Health   Financial Resource Strain:   . Difficulty of Paying Living Expenses:   Food Insecurity:   . Worried About Charity fundraiser in the Last Year:   . Arboriculturist in the Last Year:   Transportation Needs:   . Film/video editor (Medical):   Marland Kitchen Lack  of Transportation (Non-Medical):   Physical Activity:   . Days of Exercise per Week:   . Minutes of Exercise per Session:   Stress:   . Feeling of Stress :   Social Connections:   . Frequency of Communication with Friends and Family:   . Frequency of Social Gatherings with Friends and Family:   . Attends Religious Services:   . Active Member of Clubs or Organizations:   . Attends Archivist Meetings:   Marland Kitchen Marital Status:   Intimate Partner Violence:   . Fear of Current or Ex-Partner:   . Emotionally Abused:   Marland Kitchen Physically Abused:   . Sexually Abused:       PHYSICAL EXAM  Vitals:   07/20/19 1325  BP: 130/82  Pulse: 89  Weight: (!) 310 lb (140.6 kg)  Height: 5\' 9"  (1.753 m)   Body mass index is 45.78 kg/m.  Generalized: Well developed, in no acute distress  Cardiology: normal rate and rhythm, no murmur noted Respiratory: clear to auscultation bilaterally  Neurological examination  Mentation: Alert oriented to time, place,  history taking. Follows all commands speech and language fluent Cranial nerve II-XII: Pupils were equal round reactive to light. Extraocular movements were full, visual field were full  Motor: The motor testing reveals 5 over 5 strength of all 4 extremities. Good symmetric motor tone is noted throughout.  Gait and station: Gait is normal.   DIAGNOSTIC DATA (LABS, IMAGING, TESTING) - I reviewed patient records, labs, notes, testing and imaging myself where available.  No flowsheet data found.   Lab Results  Component Value Date   WBC 11.5 (H) 08/03/2018   HGB 11.9 (L) 08/03/2018   HCT 36.8 08/03/2018   MCV 91.3 08/03/2018   PLT 195 08/03/2018      Component Value Date/Time   NA 139 08/03/2018 0325   K 4.5 08/03/2018 0325   CL 104 08/03/2018 0325   CO2 26 08/03/2018 0325   GLUCOSE 135 (H) 08/03/2018 0325   BUN 19 08/03/2018 0325   CREATININE 0.93 08/03/2018 0325   CREATININE 0.93 05/26/2018 0925   CALCIUM 8.6 (L) 08/03/2018 0325    PROT 7.2 05/26/2018 0925   ALBUMIN 4.2 02/13/2016 1115   AST 23 05/26/2018 0925   ALT 23 05/26/2018 0925   ALKPHOS 73 02/13/2016 1115   BILITOT 0.7 05/26/2018 0925   GFRNONAA >60 08/03/2018 0325   GFRNONAA 65 05/26/2018 0925   GFRAA >60 08/03/2018 0325   GFRAA 75 05/26/2018 0925   Lab Results  Component Value Date   CHOL 256 (H) 05/25/2019   HDL 38 (L) 05/25/2019   LDLCALC 176 (H) 05/25/2019   TRIG 221 (H) 05/25/2019   CHOLHDL 6.7 (H) 05/25/2019   Lab Results  Component Value Date   HGBA1C 5.4 12/01/2018   No results found for: [id number] Lab Results  Component Value Date   TSH 3.02 05/26/2018      ASSESSMENT AND PLAN 63 y.o. year old female  has a past medical history of Anxiety, Atrial septal defect, Cancer ([place]), Complication of anesthesia, Depression, Hypertension, Hypothyroidism, Migraine, Migraine headache, Obesity, Obstructive apnea, PONV (postoperative nausea and vomiting), Primary localized osteoarthritis of left knee (08/02/2018), Pseudotumor cerebri (Diagnosed in March 2008-), and Statin intolerance. here with     ICD-10-CM   1. OSA on CPAP  G47.33 For home use only DME continuous positive airway pressure (CPAP)   Z99.89     Tashe is doing very well on CPAP therapy.  Compliance report reveals excellent compliance.  I have encouraged her to continue using CPAP nightly and for greater than 4 hours each night.  We will update supply orders and sent to DME.  Healthy lifestyle habits encouraged.  She will continue close follow-up with primary care.  She will follow-up with me in 1 year, sooner if needed.  She verbalizes understanding and agreement with this plan.   Orders Placed This Encounter  Procedures  . For home use only DME continuous positive airway pressure (CPAP)    Supplies    Order Specific Question:   Length of Need    Answer:   Lifetime    Order Specific Question:   Patient has OSA or probable OSA    Answer:   Yes    Order Specific Question:   Is  the patient currently using CPAP in the home    Answer:   Yes    Order Specific Question:   Settings    Answer:   Other see comments    Order Specific Question:   CPAP supplies needed    Answer:  Mask, headgear, cushions, filters, heated tubing and water chamber     No orders of the defined types were placed in this encounter.     I spent 15 minutes with the patient. 50% of this time was spent counseling and educating patient on plan of care and medications.    Debbora Presto, FNP-C 07/20/2019, 1:58 PM [facility] [address], [place], [place] [address] [phone]

## 2019-09-07 ENCOUNTER — Other Ambulatory Visit: Payer: Self-pay

## 2019-09-07 ENCOUNTER — Ambulatory Visit
Admission: RE | Admit: 2019-09-07 | Discharge: 2019-09-07 | Disposition: A | Discharge status: Home / Self Care | Payer: PRIVATE HEALTH INSURANCE | Source: Ambulatory Visit | Attending: Internal Medicine | Admitting: Internal Medicine

## 2019-09-07 DIAGNOSIS — Z1231 Encounter for screening mammogram for malignant neoplasm of breast: Secondary | ICD-10-CM

## 2019-09-25 ENCOUNTER — Other Ambulatory Visit: Payer: Self-pay | Admitting: Internal Medicine

## 2019-09-29 ENCOUNTER — Telehealth: Payer: Self-pay | Admitting: Internal Medicine

## 2019-09-29 MED ORDER — LEVOTHYROXINE SODIUM 50 MCG PO TABS: 50.0000 ug | ORAL_TABLET | Freq: Every day | ORAL | 0 refills | Status: DC

## 2019-09-29 NOTE — Telephone Encounter (Signed)
Pt called and said that she is out of her levothyroxine and that her pharmacy said they sent for a refill but havent received anything yet, she said they sent it in again today.

## 2019-10-23 ENCOUNTER — Telehealth: Payer: Self-pay | Admitting: Internal Medicine

## 2019-10-23 MED ORDER — AMLODIPINE BESYLATE 5 MG PO TABS: 5.0000 mg | ORAL_TABLET | Freq: Every day | ORAL | 1 refills | Status: DC

## 2019-10-23 NOTE — Telephone Encounter (Signed)
Please refill x 6 months. Please let her know her pharmacy at [facility] is not sending these requests to [place] as I do them all.

## 2019-10-23 NOTE — Telephone Encounter (Signed)
Diana [name] [phone]  [name] called to say she is completely out of below medication and the pharmacy says they have sent it to [place] electronically last week and again today.  amLODipine (NORVASC) 5 MG tablet  [place], [place] Phone:  [phone]  Fax:  [phone]

## 2019-11-30 ENCOUNTER — Other Ambulatory Visit: Payer: PRIVATE HEALTH INSURANCE | Admitting: Internal Medicine

## 2019-11-30 ENCOUNTER — Other Ambulatory Visit: Payer: Self-pay

## 2019-11-30 DIAGNOSIS — Z0000 Encounter for general adult medical examination without abnormal findings: Secondary | ICD-10-CM

## 2019-11-30 DIAGNOSIS — E039 Hypothyroidism, unspecified: Secondary | ICD-10-CM

## 2019-11-30 DIAGNOSIS — I10 Essential (primary) hypertension: Secondary | ICD-10-CM

## 2019-11-30 DIAGNOSIS — R7302 Impaired glucose tolerance (oral): Secondary | ICD-10-CM

## 2019-11-30 DIAGNOSIS — E782 Mixed hyperlipidemia: Secondary | ICD-10-CM

## 2019-12-01 LAB — LIPID PANEL
Cholesterol: 246 mg/dL — ABNORMAL HIGH (ref <200)
HDL: 41 mg/dL — ABNORMAL LOW (ref >=50)
LDL Cholesterol (Calc): 165 mg/dL (calc) — ABNORMAL HIGH
Non-HDL Cholesterol (Calc): 205 mg/dL (calc) — ABNORMAL HIGH (ref <130)
Total CHOL/HDL Ratio: 6 (calc) — ABNORMAL HIGH (ref <5.0)
Triglycerides: 238 mg/dL — ABNORMAL HIGH (ref <150)

## 2019-12-01 LAB — COMPLETE METABOLIC PANEL WITH GFR
AG Ratio: 1.5 (calc) (ref 1.0–2.5)
ALT: 20 U/L (ref 6–29)
AST: 20 U/L (ref 10–35)
Albumin: 4.2 g/dL (ref 3.6–5.1)
Alkaline phosphatase (APISO): 96 U/L (ref 37–153)
BUN: 17 mg/dL (ref 7–25)
CO2: 30 mmol/L (ref 20–32)
Calcium: 9.3 mg/dL (ref 8.6–10.4)
Chloride: 104 mmol/L (ref 98–110)
Creat: 0.93 mg/dL (ref 0.50–0.99)
GFR, Est African American: 74 mL/min/{1.73_m2} (ref >=60)
GFR, Est Non African American: 64 mL/min/{1.73_m2} (ref >=60)
Globulin: 2.8 g/dL (calc) (ref 1.9–3.7)
Glucose, Bld: 100 mg/dL — ABNORMAL HIGH (ref 65–99)
Potassium: 4.7 mmol/L (ref 3.5–5.3)
Sodium: 142 mmol/L (ref 135–146)
Total Bilirubin: 0.5 mg/dL (ref 0.2–1.2)
Total Protein: 7 g/dL (ref 6.1–8.1)

## 2019-12-01 LAB — CBC WITH DIFFERENTIAL/PLATELET
Absolute Monocytes: 389 cells/uL (ref 200–950)
Basophils Absolute: 41 cells/uL (ref 0–200)
Basophils Relative: 0.7 %
Eosinophils Absolute: 290 cells/uL (ref 15–500)
Eosinophils Relative: 5 %
HCT: 39.6 % (ref 35.0–45.0)
Hemoglobin: 13.4 g/dL (ref 11.7–15.5)
Lymphs Abs: 2175 cells/uL (ref 850–3900)
MCH: 29.7 pg (ref 27.0–33.0)
MCHC: 33.8 g/dL (ref 32.0–36.0)
MCV: 87.8 fL (ref 80.0–100.0)
MPV: 10.4 fL (ref 7.5–12.5)
Monocytes Relative: 6.7 %
Neutro Abs: 2906 cells/uL (ref 1500–7800)
Neutrophils Relative %: 50.1 %
Platelets: 221 10*3/uL (ref 140–400)
RBC: 4.51 10*6/uL (ref 3.80–5.10)
RDW: 12.5 % (ref 11.0–15.0)
Total Lymphocyte: 37.5 %
WBC: 5.8 10*3/uL (ref 3.8–10.8)

## 2019-12-01 LAB — TSH: TSH: 2.26 mIU/L (ref 0.40–4.50)

## 2019-12-07 ENCOUNTER — Ambulatory Visit: Payer: PRIVATE HEALTH INSURANCE | Admitting: Internal Medicine

## 2019-12-08 ENCOUNTER — Ambulatory Visit: Payer: PRIVATE HEALTH INSURANCE | Admitting: Internal Medicine

## 2019-12-08 ENCOUNTER — Encounter: Payer: Self-pay | Admitting: Internal Medicine

## 2019-12-08 ENCOUNTER — Other Ambulatory Visit: Payer: Self-pay

## 2019-12-08 VITALS — BP 150/90 | HR 90 | Ht 69.0 in | Wt 297.0 lb

## 2019-12-08 DIAGNOSIS — E559 Vitamin D deficiency, unspecified: Secondary | ICD-10-CM

## 2019-12-08 DIAGNOSIS — Z1321 Encounter for screening for nutritional disorder: Secondary | ICD-10-CM

## 2019-12-08 DIAGNOSIS — Z0000 Encounter for general adult medical examination without abnormal findings: Secondary | ICD-10-CM

## 2019-12-08 DIAGNOSIS — R7302 Impaired glucose tolerance (oral): Secondary | ICD-10-CM | POA: Diagnosis not present

## 2019-12-08 DIAGNOSIS — Z85820 Personal history of malignant melanoma of skin: Secondary | ICD-10-CM

## 2019-12-08 DIAGNOSIS — Q211 Atrial septal defect, unspecified: Secondary | ICD-10-CM

## 2019-12-08 DIAGNOSIS — R7309 Other abnormal glucose: Secondary | ICD-10-CM

## 2019-12-08 DIAGNOSIS — Z6841 Body Mass Index (BMI) 40.0 and over, adult: Secondary | ICD-10-CM

## 2019-12-08 DIAGNOSIS — I10 Essential (primary) hypertension: Secondary | ICD-10-CM

## 2019-12-08 DIAGNOSIS — Z8616 Personal history of COVID-19: Secondary | ICD-10-CM

## 2019-12-08 DIAGNOSIS — E782 Mixed hyperlipidemia: Secondary | ICD-10-CM

## 2019-12-08 DIAGNOSIS — Z8659 Personal history of other mental and behavioral disorders: Secondary | ICD-10-CM

## 2019-12-08 DIAGNOSIS — E039 Hypothyroidism, unspecified: Secondary | ICD-10-CM | POA: Diagnosis not present

## 2019-12-08 DIAGNOSIS — Z8669 Personal history of other diseases of the nervous system and sense organs: Secondary | ICD-10-CM

## 2019-12-09 LAB — VITAMIN D 25 HYDROXY (VIT D DEFICIENCY, FRACTURES): Vit D, 25-Hydroxy: 19 ng/mL — ABNORMAL LOW (ref 30–100)

## 2019-12-09 LAB — HEMOGLOBIN A1C
Hgb A1c MFr Bld: 5.5 % of total Hgb (ref <5.7)
Mean Plasma Glucose: 111 (calc)
eAG (mmol/L): 6.2 (calc)

## 2019-12-12 ENCOUNTER — Other Ambulatory Visit: Payer: Self-pay

## 2019-12-12 ENCOUNTER — Telehealth: Payer: Self-pay | Admitting: Internal Medicine

## 2019-12-12 MED ORDER — ERGOCALCIFEROL 1.25 MG (50000 UT) PO CAPS: 50000.0000 [IU] | ORAL_CAPSULE | ORAL | 0 refills | Status: DC

## 2019-12-12 NOTE — Telephone Encounter (Signed)
LVM to CB and schedule 3 month follow up / Vit D recheck  And that Vit D prescription had been sent to pharmacy at [facility].

## 2019-12-29 ENCOUNTER — Other Ambulatory Visit: Payer: Self-pay | Admitting: Internal Medicine

## 2020-01-21 ENCOUNTER — Encounter: Payer: Self-pay | Admitting: Internal Medicine

## 2020-01-21 NOTE — Progress Notes (Signed)
Subjective:    Patient ID: Diana [name], female    DOB: 04/16/1955, 65 y.o.   MRN: [id number]  HPI 65 year old Female in for health maintenance exam and evaluation of medical issues.  History of sleep apnea treated with CPAP and followed by Neurology.  Seen by Dr. Melvyn Novas after having  COVID-19 in January 2021 treated with monoclonal antibody  and subsequently developed refractory cough.  Dr. Melvyn Novas felt she had upper airway cough syndrome consistent with post viral syndrome.  Was treated with PPI for potential reflux, prednisone, and cough suppressant.  Underwent left unicompartmental partial knee arthroplasty by Dr. Mardelle Matte in July 2020.  History of partial right knee arthroplasty in 2018.  Followed by Dr. Burt Knack for her small secundum ASD and is asymptomatic.  History of mixed hyperlipidemia-currently not on statin medication  Vitamin D level is low at 19 and she will be placed on high-dose vitamin D weekly.  Total cholesterol was 246, HDL 41, triglycerides 238 and LDL cholesterol 165.  Was intolerant of Crestor.  Has tried pravastatin.  Hemoglobin A1c is normal.  Has remote history of impaired glucose tolerance.  History of hypothyroidism treated with thyroid replacement medication.  History of anxiety depression managed with Pristiq and Xanax.  History of migraine headaches treated with Imitrex.  History of allergic rhinitis treated with Zyrtec.  History of possible pseudotumor cerebri 2008.  She saw Dr. Erling Cruz and was thought to have papilledema versus pseudopapilledema with significant elevated opening pressures on lumbar puncture.  Patient was hospitalized in 2007 with volume depletion due to a viral syndrome and SSRI discontinuation syndrome.  She had upper endoscopy by Dr. Alben Spittle which was normal.  No known drug allergies.  In March 2020 Dr. Fredna Dow removed a mass from her left middle finger right hand consistent with mucoid cyst.  History of basal cell carcinoma on  her back in 2012.  History of malignant melanoma left thigh in 2012 treated at [facility] had lymph node sampling.  Lesion was Clark level IV.  Is followed by Dr. Rolm Bookbinder.  Social history: Married.  Does not smoke.  Seldom drinks alcohol.  Maybe 1 alcoholic drink per month or no more than 1 week.  Has never been pregnant.  Tried infertility treatments without success.  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  Family history: Mother with history of migraine headache.  Father died at age 31 with history of stroke and congestive heart failure.  1 brother in good health.  Mother died at age 48 with a colonic infarction but had history of Alzheimer's disease.  Review of Systems denies chest pain, shortness of breath, abdominal issues, urinary tract issues.  History of right shoulder pain.     Objective:   Physical Exam Weight 297 pounds BMI 43.86 blood pressure 140/80 pulse 90 pulse oximetry 95% Skin warm and dry.  No lesions noted.  No adenopathy in cervical area.  TMs clear.  Neck is supple without JVD thyromegaly or carotid bruits.  Chest is clear to auscultation.  Cardiac exam: Regular rate and rhythm normal S1 and S2 without murmurs or gallops.  Abdomen is soft, nondistended without hepatosplenomegaly masses or tenderness.  GYN exam deferred to GYN physician.  No lower extremity edema.  Neuro: No gross focal deficits on brief neurological exam.  Decreased range of motion right shoulder due to history of right shoulder derangement      Assessment & Plan:  Status post bilateral knee  hemiarthroplasties  Vitamin D deficiency-prescription for vitamin D high-dose weekly  Right shoulder arthropathy basically she just tolerates it  History of migraine headaches and has medication on hand to take if needed  History of allergic rhinitis stable with antihistamine  History of anxiety depression stable  History of hypothyroidism stable on thyroid replacement  medication  History of impaired glucose tolerance but hemoglobin A1c is normal  Intolerant of statin medication but has history of mixed hyperlipidemia  BMI 43  ASD followed by Dr. Excell Seltzer  History of COVID-19 in 2021  Essential hypertension- stable  History of snoring/sleep apnea and has CPAP device  Plan: Continue current medications.  Take high-dose vitamin D supplement weekly.  Return in 1 year or as needed.  Had mammogram in August 2021.  Had colonoscopy in 2011 and repeat study  due.  Has had 3 Covid immunizations.  Tetanus immunization is up-to-date until next year.  Had high-dose flu vaccine in October.

## 2020-01-21 NOTE — Patient Instructions (Addendum)
Please continue to work on diet exercise and weight loss.  Please continue current medications.  Immunizations are up-to-date.  Follow-up in 1 year or as needed.  Take high-dose vitamin D weekly.  No change in other medications.

## 2020-02-09 IMAGING — CT CT HEART SCORING
2 series · 16 of 20 positions shown, 18 images · non-contrast
Comparison: None.
COMPARISON: None.

Addendum:
EXAM:
OVER-READ INTERPRETATION  CT CHEST

The following report is an over-read performed by radiologist Dr.
Kaukaa Jox [REDACTED] on 08/12/2018. This over-read
does not include interpretation of cardiac or coronary anatomy or
pathology. The coronary calcium score interpretation by the
cardiologist is attached.
CLINICAL DATA: Risk stratification
Coronary Calcium Score
TECHNIQUE: The patient was scanned on a Siemens Force scanner. Axial
non-contrast 3 mm slices were carried out through the heart. The
data set was analyzed on a dedicated work station and scored using
the Agatson method.

[Series 2: casc 3.0 i36f 2 bestdiast 67 % · axial · 0.42mm/px · z∈[-250,-145]mm · 8 of 47 slices shown, 10 images]
[im 6/47  vessel]
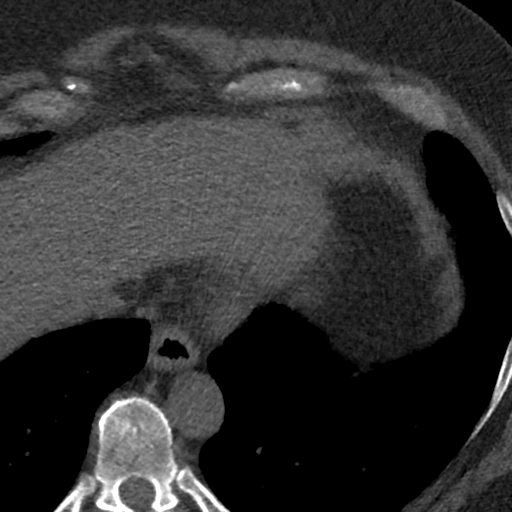
[im 6/47  lung]
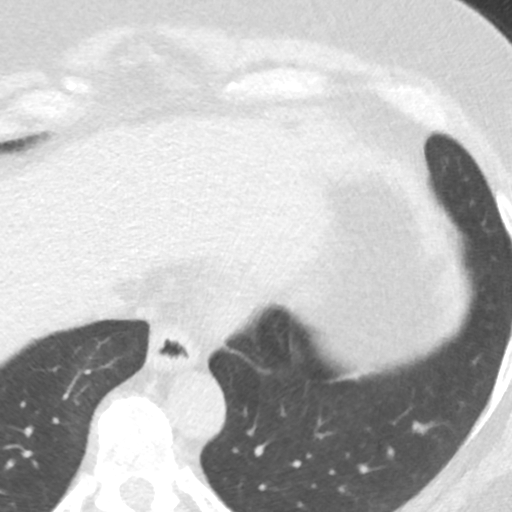
[im 11/47  vessel]
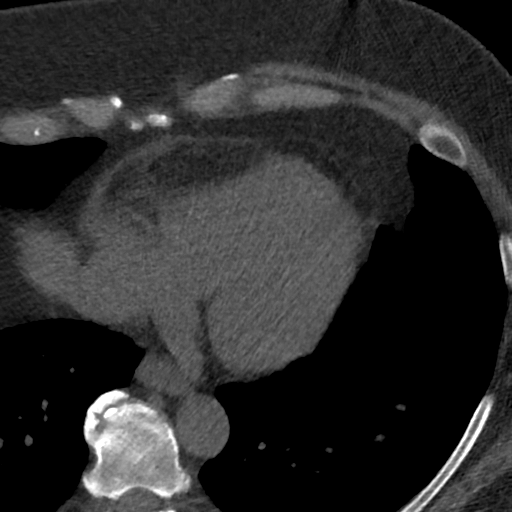
[im 16/47  vessel]
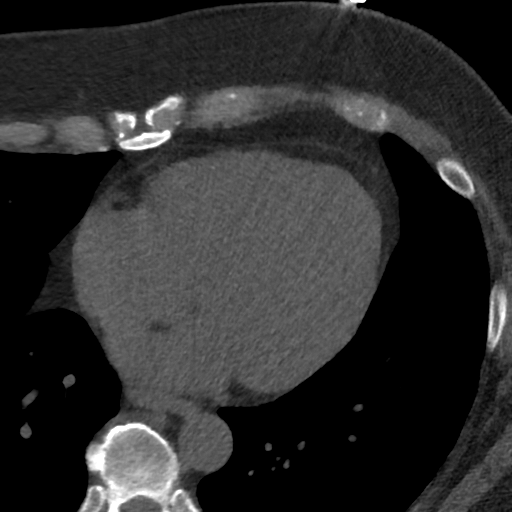
[im 21/47  vessel]
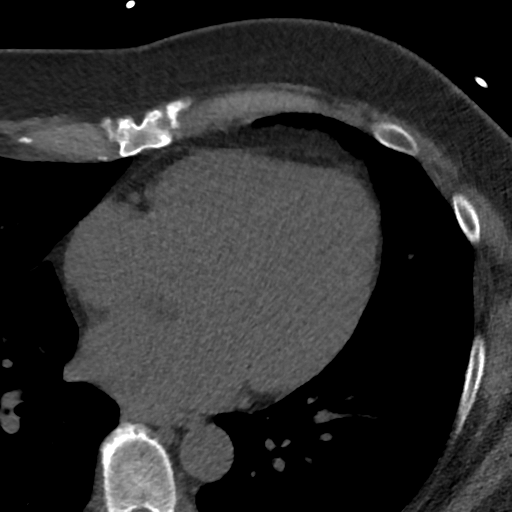
[im 26/47  vessel]
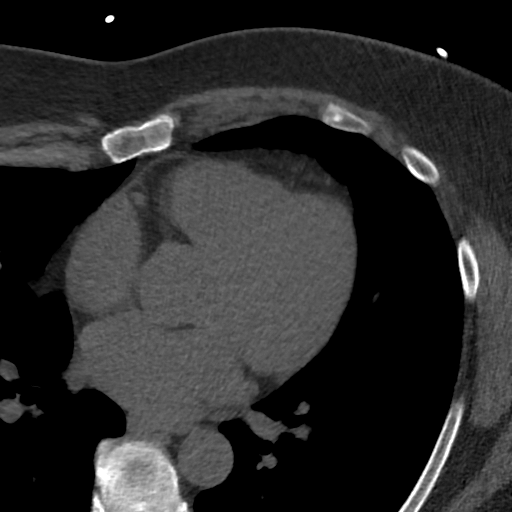
[im 26/47  lung]
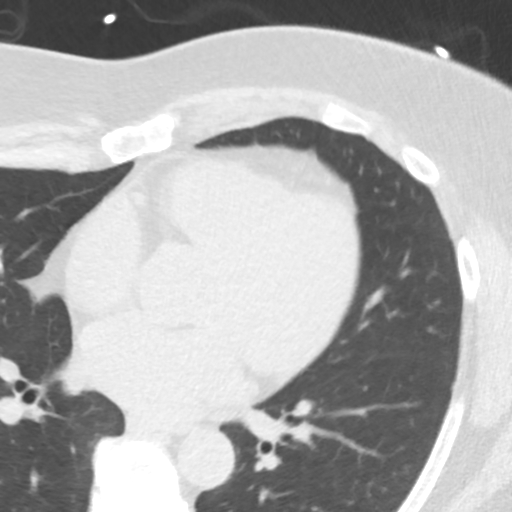
[im 31/47  vessel]
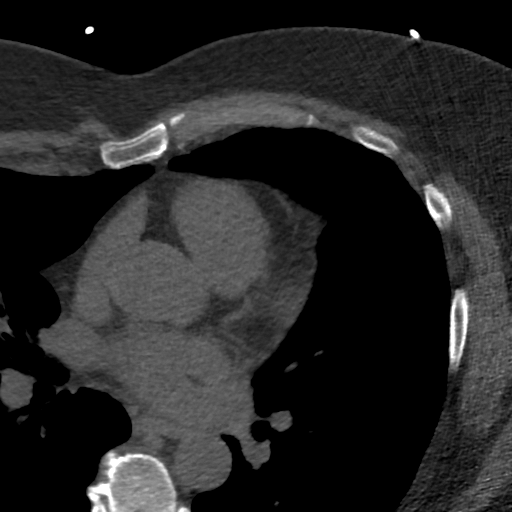
[im 36/47  vessel]
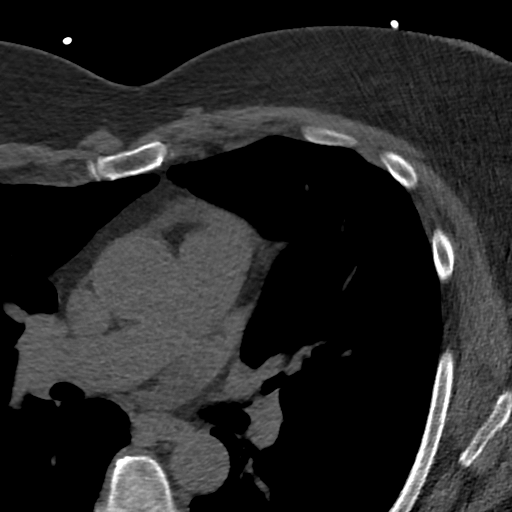
[im 41/47  vessel]
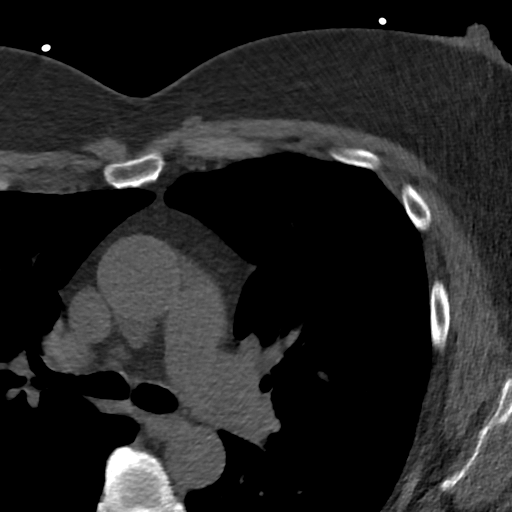

[Series 4: lung st 67 % · axial · 0.75mm/px · z∈[-250,-144]mm · 8 of 47 slices shown]
[im 6/47  lung]
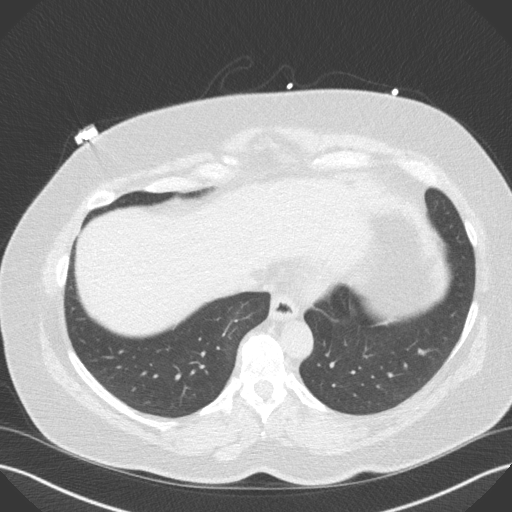
[im 11/47  lung]
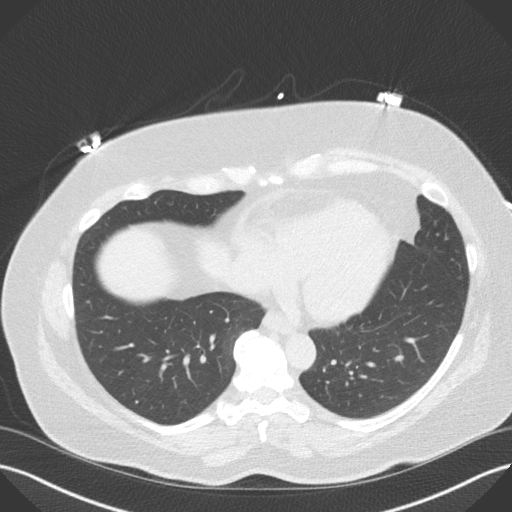
[im 16/47  lung]
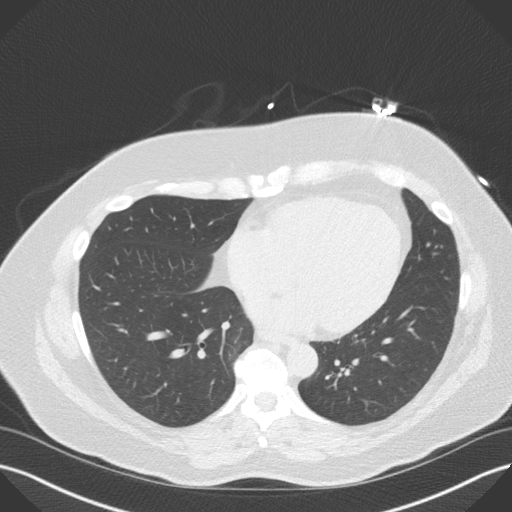
[im 21/47  lung]
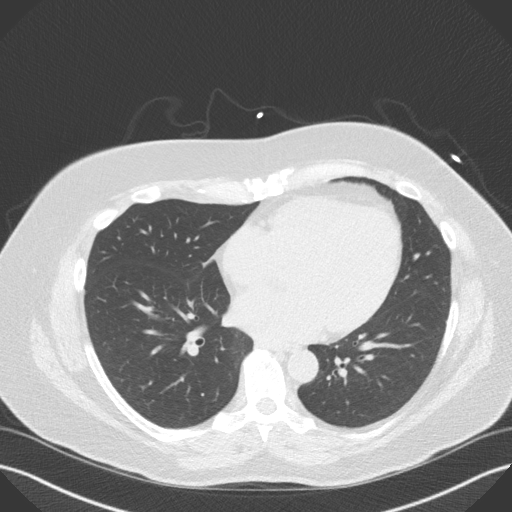
[im 26/47  lung]
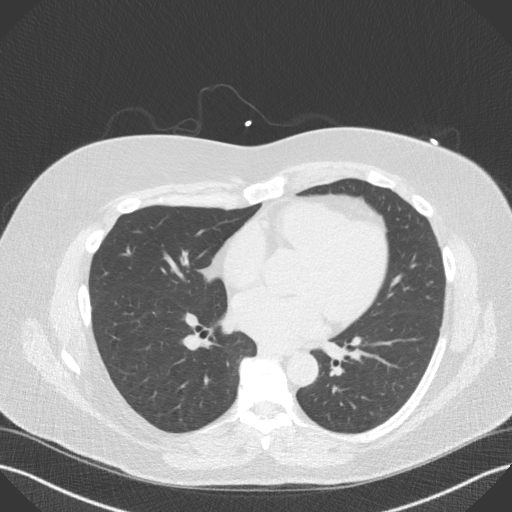
[im 31/47  lung]
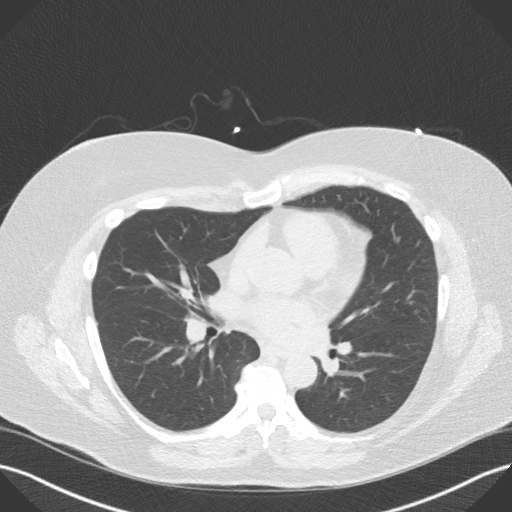
[im 36/47  lung]
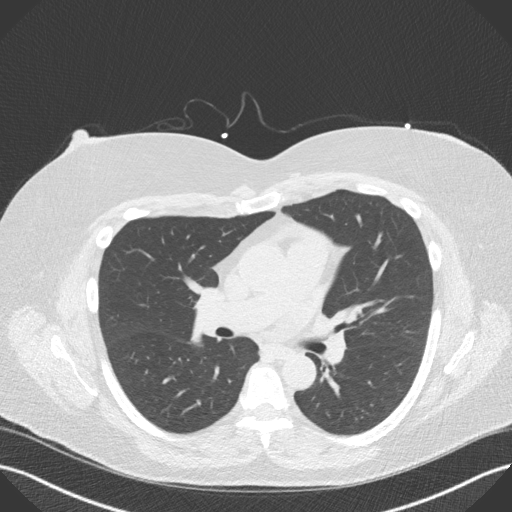
[im 41/47  lung]
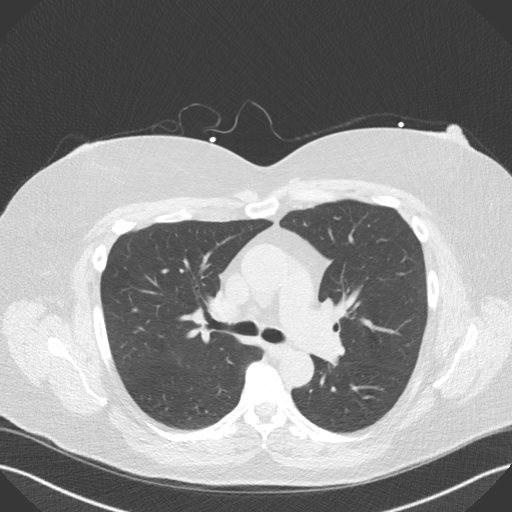

[16 of 20 positions shown; findings below may reference images not displayed]

FINDINGS: Vascular: Heart is normal size.  Visualized aorta is normal caliber.

Mediastinum/Nodes: No adenopathy in the lower mediastinum or hila.

Lungs/Pleura: Visualized lungs clear.  No effusions.

Upper Abdomen: Imaging into the upper abdomen shows no acute
findings.

Musculoskeletal: Chest wall soft tissues are unremarkable. No acute
bony abnormality.
IMPRESSION: No acute or significant extracardiac abnormality.
FINDINGS: Non-cardiac: See separate report from [REDACTED].

Ascending Aorta: Normal size, no calcifications.

Pericardium: Normal.

Coronary arteries: Normal origin.
IMPRESSION: Coronary calcium score of 0. This was 0 percentile for age and sex
matched control.

*** End of Addendum ***
EXAM:
OVER-READ INTERPRETATION  CT CHEST

The following report is an over-read performed by radiologist Dr.
Kaukaa Jox [REDACTED] on 08/12/2018. This over-read
does not include interpretation of cardiac or coronary anatomy or
pathology. The coronary calcium score interpretation by the
cardiologist is attached.
FINDINGS: Vascular: Heart is normal size.  Visualized aorta is normal caliber.

Mediastinum/Nodes: No adenopathy in the lower mediastinum or hila.

Lungs/Pleura: Visualized lungs clear.  No effusions.

Upper Abdomen: Imaging into the upper abdomen shows no acute
findings.

Musculoskeletal: Chest wall soft tissues are unremarkable. No acute
bony abnormality.
IMPRESSION: No acute or significant extracardiac abnormality.

## 2020-02-13 ENCOUNTER — Other Ambulatory Visit: Payer: Self-pay

## 2020-02-13 ENCOUNTER — Ambulatory Visit (AMBULATORY_SURGERY_CENTER): Payer: Self-pay | Admitting: *Deleted

## 2020-02-13 VITALS — Ht 68.0 in | Wt 320.0 lb

## 2020-02-13 DIAGNOSIS — Z1211 Encounter for screening for malignant neoplasm of colon: Secondary | ICD-10-CM

## 2020-02-13 MED ORDER — PLENVU 140 G PO SOLR: 1.0000 | Freq: Once | ORAL | 0 refills | Status: AC

## 2020-02-13 NOTE — Progress Notes (Signed)
No egg or soy allergy known to patient  No issues with past sedation with any surgeries or procedures No intubation problems in the past  No FH of Malignant Hyperthermia No diet pills per patient No home 02 use per patient  No blood thinners per patient  Pt denies issues with constipation  No A fib or A flutter  EMMI video to pt or via MyChart  COVID 19 guidelines implemented in PV today with Pt and RN  Pt is fully vaccinated  for Covid   Plenvu  Coupon given to pt in PV today , Code to Pharmacy   Due to the COVID-19 pandemic we are asking patients to follow certain guidelines.  Pt aware of COVID protocols and [facility] guidelines   

## 2020-02-24 ENCOUNTER — Other Ambulatory Visit: Payer: Self-pay | Admitting: Internal Medicine

## 2020-02-26 ENCOUNTER — Encounter: Payer: Self-pay | Admitting: Internal Medicine

## 2020-02-26 ENCOUNTER — Other Ambulatory Visit: Payer: Self-pay

## 2020-02-26 ENCOUNTER — Ambulatory Visit (INDEPENDENT_AMBULATORY_CARE_PROVIDER_SITE_OTHER): Payer: PRIVATE HEALTH INSURANCE | Admitting: Internal Medicine

## 2020-02-26 VITALS — BP 144/92 | HR 92 | Ht 69.0 in | Wt 322.0 lb

## 2020-02-26 DIAGNOSIS — T466X5A Adverse effect of antihyperlipidemic and antiarteriosclerotic drugs, initial encounter: Secondary | ICD-10-CM | POA: Diagnosis not present

## 2020-02-26 DIAGNOSIS — M791 Myalgia, unspecified site: Secondary | ICD-10-CM

## 2020-02-26 DIAGNOSIS — E782 Mixed hyperlipidemia: Secondary | ICD-10-CM

## 2020-02-26 MED ORDER — ATORVASTATIN CALCIUM 20 MG PO TABS: 20.0000 mg | ORAL_TABLET | Freq: Every day | ORAL | 3 refills | Status: DC

## 2020-02-26 NOTE — Progress Notes (Signed)
OFFICE NOTE  Chief Complaint:  Follow-up lipids  Primary Care Physician: Elby Showers, MD  HPI:  Diana [name] is a 65 y.o. female seen today for lipid evaluation at the request of Dr. Renold Genta.  She is a pleasant 65 year old female who was remotely followed by Dr. Doreatha Lew and then Dr. Burt Knack for a small secundum atrial septal defect.  She has not had this repaired.  Other medical problems include morbid obesity, obstructive sleep apnea on CPAP and hypothyroidism.  She denies any significant history of hypertension, diabetes and no real significant coronary disease history.  Both of her parents she has had lived into their 7s.  He does have a history of dyslipidemia which is in her assessment primarily diet driven.  She said many years ago did her cholesterol was much lower about 150 for total cholesterol however it subsequently has risen with significant weight gain.  She is also having problems ambulating and has an upcoming knee surgery scheduled.  She hopes to work on additional weight loss and dietary changes after that.  She says she is under a lot of stress and is a "stress eater".  She is trying a number of commercial diets.  Profile a month ago showed total cholesterol 253, HDL 40, triglycerides 223 and LDL of 175.  This represents mixed dyslipidemia and typical pattern for metabolic syndrome.  This is also an atherogenic lipid profile.  She has previously taken rosuvastatin which caused significant myalgias and recently was on pravastatin which she said caused sleep disturbance and was not tolerated.  She does not have any known coronary disease history.  She did have a vascular screen profile done a couple of years ago which showed some mild right internal carotid plaque.  11/03/2018  [name] returns today for follow-up.  We did perform a calcium score which was 0.  Overall I think this gives [place] some reassurance with regards to her lipids however if she persistently has elevated  lipids then she will be at risk for coronary disease in the future.  I would now recommend aggressive work on her diet and weight loss.  She has been working with a dietitian however is gained about 15 pounds since I last saw her.  She says her "body is out of whack".  Hopefully this can be better controlled.  06/01/2019  [name] returns today for follow-up.  He has had several health issues over the past several months including coronavirus in January.  She received antibiotic therapy for this.  She had a ready received her initial Pfizer vaccine.  She feels like just now she started to recover from this.  Unfortunately weight has not come down a lot and her cholesterol profile is essentially unchanged with LDL of 176.  We discussed therapeutic options however I do not think that she is really had an opportunity to work on the weight loss and dietary changes she needs to do.  02/26/2020  [name] seen today in follow-up.  She says that this past several months has been difficult.  She has been working more hours than she expected.  She said she finally had to retire from her job.  Unfortunately after we had discussed about try to work on weight loss and dietary changes, this has not been successful.  Weight remains at 322 with a BMI of 47.  She recently has started a weight loss program and exercise and made dietary changes.  After talking with her PCP she is amenable to trying  therapy.  Her lipids have essentially been unchanged with total cholesterol 246 in November, HDL 41, LDL 165 and triglycerides 238.  Her calcium score was 0 however her cardiovascular risk still I believe will remain elevated in the long-term.  I recommended a retrial of statin therapy.  We will target an LDL less than 100.  PMHx:  Past Medical History:  Diagnosis Date  . Anxiety    controlled  . Atrial septal defect    Small,non significant atrial septal defect with QP/QS of 1.25 by MRA.  . Cancer ([place])    melanoma left leg 2012   . Complication of anesthesia    gets migraine when she has to be NPO  . Depression   . Hyperlipidemia   . Hypertension    Pt doesn't take meds and denies having BP issues however her diostolic in alway in high 80s -90s  . Hypothyroidism   . Migraine    occasionally  . Migraine headache   . Obesity   . Obstructive apnea    CPAP user since 2012.   Marland Kitchen PONV (postoperative nausea and vomiting)    n/v from migraine, not anesthesia  . Primary localized osteoarthritis of left knee 08/02/2018  . Pseudotumor cerebri Diagnosed in March 2008-   She sees Dr Clovis Riley in this regard  . Sleep apnea    uses CPAP  . Statin intolerance    with elevated CRP    Past Surgical History:  Procedure Laterality Date  . BACK SURGERY    . BUNIONECTOMY    . FRACTURE SURGERY     left 5th metatarsal  . MASS EXCISION Left 03/22/2018   Procedure: EXCISION CYST DEBRIDEMENT DISTAL INTERPHALANGEAL LEFT MIDDLE FINGER;  Surgeon: Daryll Brod, MD;  Location: [facility];  Service: Orthopedics;  Laterality: Left;  FAB  . ORTHOPEDIC SURGERY     lumbar surgery   . PARTIAL KNEE ARTHROPLASTY Right 09/15/2016   Procedure: UNICOMPARTMENTAL KNEE;  Surgeon: Marchia Bond, MD;  Location: [facility];  Service: Orthopedics;  Laterality: Right;  . PARTIAL KNEE ARTHROPLASTY Left 08/02/2018   Procedure: UNICOMPARTMENTAL KNEE;  Surgeon: Marchia Bond, MD;  Location: [facility];  Service: Orthopedics;  Laterality: Left;  . pin in the fifth metatarsal    . right knee arthroscopy      FAMHx:  Family History  Problem Relation Age of Onset  . Alzheimer's disease Mother   . Rheumatic fever Father   . Colon cancer Neg Hx   . Esophageal cancer Neg Hx   . Stomach cancer Neg Hx   . Colon polyps Neg Hx     SOCHx:   reports that she has never smoked. She has never used smokeless tobacco. She reports current alcohol use. She reports that she does not use drugs.  ALLERGIES:  Allergies  Allergen Reactions  . Augmentin  [Amoxicillin-Pot Clavulanate] Nausea And Vomiting and Other (See Comments)    Has patient had a PCN reaction causing immediate rash, facial/tongue/throat swelling, SOB or lightheadedness with hypotension: No Has patient had a PCN reaction causing severe rash involving mucus membranes or skin necrosis: No Has patient had a PCN reaction that required hospitalization: No Has patient had a PCN reaction occurring within the last 10 years: #  #  #  YES  #  #  #  > 2014 If all of the above answers are "NO", then may proceed with Cephalosporin use.     ROS: Pertinent items noted in HPI and remainder of comprehensive ROS  otherwise negative.  HOME MEDS: Current Outpatient Medications on File Prior to Visit  Medication Sig Dispense Refill  . ALPRAZolam (XANAX) 0.5 MG tablet Take 0.5 mg by mouth daily as needed for anxiety.  0  . amLODipine (NORVASC) 5 MG tablet Take 1 tablet (5 mg total) by mouth daily. 90 tablet 1  . Aspirin 81 MG CAPS Take by mouth daily.    . cetirizine (ZYRTEC) 10 MG tablet Take 10 mg by mouth daily.    . ergocalciferol (DRISDOL) 1.25 MG (50000 UT) capsule Take 1 capsule (50,000 Units total) by mouth once a week. 12 capsule 0  . levothyroxine (SYNTHROID) 50 MCG tablet Take 1 tablet (50 mcg total) by mouth daily. 90 tablet 0  . PRISTIQ 100 MG 24 hr tablet Take 100 mg by mouth daily.  1  . SUMAtriptan (IMITREX) 100 MG tablet TAKE 1 TABLET BY MOUTH ONCE. MAY REPEAT IN 2 HOURS IF HEADACHE PERSISTS OR RECURS. MAX OF 2 TABLETS IN 24 HOURS. 27 tablet 2   No current facility-administered medications on file prior to visit.    LABS/IMAGING: No results found for this or any previous visit (from the past 48 hour(s)). No results found.  LIPID PANEL:    Component Value Date/Time   CHOL 246 (H) 11/30/2019 0927   CHOL 256 (H) 05/25/2019 1133   TRIG 238 (H) 11/30/2019 0927   HDL 41 (L) 11/30/2019 0927   HDL 38 (L) 05/25/2019 1133   CHOLHDL 6.0 (H) 11/30/2019 0927   VLDL 35 (H)  02/13/2016 1115   LDLCALC 165 (H) 11/30/2019 0927     WEIGHTS: Wt Readings from Last 3 Encounters:  02/26/20 (!) 322 lb (146.1 kg)  02/13/20 (!) 320 lb (145.2 kg)  12/08/19 297 lb (134.7 kg)    VITALS: BP (!) 144/92 (BP Location: Left Arm, Patient Position: Sitting, Cuff Size: Large)   Pulse 92   Ht 5\' 9"  (1.753 m)   Wt (!) 322 lb (146.1 kg)   BMI 47.55 kg/m   EXAM: Deferred  EKG: deferred  ASSESSMENT: 1. Mixed dyslipidemia - zero CAC score (07/2018), goal LDL less than 100 2. Statin intolerance 3. Morbid obesity 4. Mild carotid artery disease 5. History of small secundum ASD  PLAN: 1.   Ms. Alleyne has persistently elevated cholesterol with weight gain over the past several months now at 322 from 297.  She has retired and is focused on weight loss and more activity.  I suspect her cholesterol will improve somewhat however she will likely still remain well above a target LDL less than 100.  I do think we can get significant reduction with a moderate intensity statin, hopefully better tolerated than her statins in the past.  She has not previously tried atorvastatin and would recommend 20mg  daily at night.  We will repeat lipids in about 6 months and follow-up with me at that time.  Pixie Casino, MD, [facility], [place] Director of the Advanced Lipid Disorders &  Cardiovascular Risk Reduction Clinic Diplomate of the American Board of Clinical Lipidology Attending Cardiologist  Direct Dial: [phone]  Fax: [phone]  Website:  [email or web address]   Nadean Corwin Levonte Molina 02/26/2020, 3:12 PM

## 2020-02-26 NOTE — Patient Instructions (Signed)
Medication Instructions:  START atorvastatin 20mg  daily for cholesterol   *If you need a refill on your cardiac medications before your next appointment, please call your pharmacy*   Lab Work: FASTING lipid panel in 6 months  If you have labs (blood work) drawn today and your tests are completely normal, you will receive your results only by: Marland Kitchen MyChart Message (if you have MyChart) OR . A paper copy in the mail If you have any lab test that is abnormal or we need to change your treatment, we will call you to review the results.   Testing/Procedures: NONE   Follow-Up: At [facility], you and your health needs are our priority.  As part of our continuing mission to provide you with exceptional heart care, we have created designated Provider Care Teams.  These Care Teams include your primary Cardiologist (physician) and Advanced Practice Providers (APPs -  Physician Assistants and Nurse Practitioners) who all work together to provide you with the care you need, when you need it.  We recommend signing up for the patient portal called "MyChart".  Sign up information is provided on this After Visit Summary.  MyChart is used to connect with patients for Virtual Visits (Telemedicine).  Patients are able to view lab/test results, encounter notes, upcoming appointments, etc.  Non-urgent messages can be sent to your provider as well.   To learn more about what you can do with MyChart, go to [email or web address].    Your next appointment:   6 month(s) - lipid clinic  The format for your next appointment:   In Person  Provider:   K. Mali Hilty, MD   Other Instructions

## 2020-02-27 ENCOUNTER — Encounter: Payer: PRIVATE HEALTH INSURANCE | Admitting: Gastroenterology

## 2020-02-29 DIAGNOSIS — F331 Major depressive disorder, recurrent, moderate: Secondary | ICD-10-CM | POA: Diagnosis not present

## 2020-03-18 ENCOUNTER — Encounter: Payer: Self-pay | Admitting: Gastroenterology

## 2020-03-25 ENCOUNTER — Other Ambulatory Visit: Payer: Self-pay

## 2020-03-25 ENCOUNTER — Ambulatory Visit: Payer: PPO | Admitting: Gastroenterology

## 2020-03-25 ENCOUNTER — Encounter: Payer: Self-pay | Admitting: Gastroenterology

## 2020-03-25 ENCOUNTER — Other Ambulatory Visit: Payer: Self-pay | Admitting: Internal Medicine

## 2020-03-25 VITALS — BP 175/100 | HR 116 | Temp 97.3°F | Ht 68.0 in | Wt 320.0 lb

## 2020-03-25 DIAGNOSIS — Z1211 Encounter for screening for malignant neoplasm of colon: Secondary | ICD-10-CM

## 2020-03-25 MED ORDER — SODIUM CHLORIDE 0.9 % IV SOLN: 500.0000 mL | Freq: Once | INTRAVENOUS | Status: DC

## 2020-03-25 NOTE — Progress Notes (Signed)
VS-SM  Pt's states no medical or surgical changes since previsit or office visit.  

## 2020-03-25 NOTE — Progress Notes (Signed)
Procedure at the [facility] cancelled due to concerns re: airway. Will reschedule at the hospital. Patient requesting an appointment one month from now.

## 2020-03-27 ENCOUNTER — Other Ambulatory Visit: Payer: Self-pay

## 2020-03-27 MED ORDER — ERGOCALCIFEROL 1.25 MG (50000 UT) PO CAPS: 50000.0000 [IU] | ORAL_CAPSULE | ORAL | 0 refills | Status: DC

## 2020-03-28 ENCOUNTER — Telehealth: Payer: Self-pay | Admitting: Gastroenterology

## 2020-03-28 NOTE — Telephone Encounter (Signed)
Pt is requesting a call back from a nurse to schedule her colonoscopy at the hospital.

## 2020-03-29 ENCOUNTER — Other Ambulatory Visit: Payer: Self-pay

## 2020-03-29 DIAGNOSIS — Z1211 Encounter for screening for malignant neoplasm of colon: Secondary | ICD-10-CM

## 2020-03-29 NOTE — Telephone Encounter (Signed)
Pt scheduled for covid screen 07/01/20 at 9am, Colon scheduled at [facility] 07/04/20 at 8:30am, pt to arrive there at 7am. Pt states she did not want to do the Plenvu prep. What prep would you like pt to try. Please advise.

## 2020-04-01 ENCOUNTER — Other Ambulatory Visit: Payer: Self-pay

## 2020-04-01 MED ORDER — NA SULFATE-K SULFATE-MG SULF 17.5-3.13-1.6 GM/177ML PO SOLN: ORAL | 0 refills | Status: DC

## 2020-04-01 NOTE — Telephone Encounter (Signed)
The bowel prep that is best covered by her insurance. Thank you.

## 2020-04-01 NOTE — Telephone Encounter (Signed)
Suprep script sent to pharmacy. Updated instructions sent to pt via mychart.

## 2020-04-23 ENCOUNTER — Ambulatory Visit (INDEPENDENT_AMBULATORY_CARE_PROVIDER_SITE_OTHER): Payer: PPO | Admitting: Internal Medicine

## 2020-04-23 ENCOUNTER — Telehealth: Payer: Self-pay | Admitting: Internal Medicine

## 2020-04-23 ENCOUNTER — Other Ambulatory Visit: Payer: Self-pay

## 2020-04-23 ENCOUNTER — Encounter: Payer: Self-pay | Admitting: Internal Medicine

## 2020-04-23 VITALS — BP 130/88 | HR 110 | Temp 98.8°F | Ht 69.0 in | Wt 299.0 lb

## 2020-04-23 DIAGNOSIS — Z87898 Personal history of other specified conditions: Secondary | ICD-10-CM | POA: Diagnosis not present

## 2020-04-23 DIAGNOSIS — G4733 Obstructive sleep apnea (adult) (pediatric): Secondary | ICD-10-CM

## 2020-04-23 DIAGNOSIS — J0100 Acute maxillary sinusitis, unspecified: Secondary | ICD-10-CM

## 2020-04-23 DIAGNOSIS — E782 Mixed hyperlipidemia: Secondary | ICD-10-CM | POA: Diagnosis not present

## 2020-04-23 DIAGNOSIS — Z8669 Personal history of other diseases of the nervous system and sense organs: Secondary | ICD-10-CM

## 2020-04-23 DIAGNOSIS — I10 Essential (primary) hypertension: Secondary | ICD-10-CM

## 2020-04-23 DIAGNOSIS — R059 Cough, unspecified: Secondary | ICD-10-CM

## 2020-04-23 DIAGNOSIS — Z9989 Dependence on other enabling machines and devices: Secondary | ICD-10-CM | POA: Diagnosis not present

## 2020-04-23 MED ORDER — HYDROCODONE-HOMATROPINE 5-1.5 MG/5ML PO SYRP
5.0000 mL | ORAL_SOLUTION | Freq: Three times a day (TID) | ORAL | 0 refills | Status: DC | PRN
Start: 2020-04-23 — End: 2020-07-04

## 2020-04-23 MED ORDER — CLARITHROMYCIN 500 MG PO TABS: 500.0000 mg | ORAL_TABLET | Freq: Two times a day (BID) | ORAL | 1 refills | Status: DC

## 2020-04-23 NOTE — Telephone Encounter (Signed)
Diana [name] [phone]  Laci called to say she thinks she has sinus infection, eyes hurt, headache, stuffiness, starting to move into chest, no fever, this all started with stuffiness last Wednesday when they got back from their trip to the [place]. She done a lab COVID test on Wednesday to come back home that was negative and she also done a home test last night that was negative.Marland Kitchen

## 2020-04-23 NOTE — Telephone Encounter (Signed)
After talking with Dr Renold Genta, scheduled office visits

## 2020-04-23 NOTE — Patient Instructions (Addendum)
May take Bonine for seasickness while on cruise in [place].  Take Biaxin 500 mg twice daily for 10 days.  Stop taking Lipitor while on Biaxin because of possible adverse reaction which is uncommon.  Take Hycodan syrup 1 teaspoon every 8 hours if needed for cough.  Rest and drink fluids.

## 2020-04-23 NOTE — Progress Notes (Signed)
   Subjective:    Patient ID: Diana [name], female    DOB: [birth year], 65 y.o.   MRN: [id number]  HPI 65 year old Female recently traveled to the [place] with her husband.  They returned home about a week ago.  Patient had Covid testing prior to entering the [place] last week that was negative.  She has had 3 COVID-19 vaccines with the last one being in October 2021.   She did a home Covid test last evening that was negative.  She has maxillary sinus pressure, headache, nasal congestion, periorbital discomfort.  Office visit was advised.  She is seen today in person.  History of sleep apnea treated with CPAP.  Was scheduled to have a colonoscopy recently but due to concerns about her airway from anesthesia, this was canceled.  It has been scheduled for June 16 at [facility]  She is also planning a trip to [place] in the near future as well.  She was thinking she might need Transderm-Scop for a cruise she will take during that trip.  We discussed the pros and cons of scopolamine.  There is a chance of an adverse reaction to scopolamine.  I would prefer she take Bonine instead.  She is agreeable to this.    Review of Systems no dysgeusia, nausea, vomiting     Objective:   Physical Exam Blood pressure 130/88 pulse 110 temperature 98.8 degrees pulse oximetry 95% weight 299 pounds.  BMI 44.15  Pharynx is only very slightly injected.  TMs are clear.  Neck is supple.  Chest is clear to auscultation without rales or wheezing.       Assessment & Plan:  Acute maxillary sinusitis-Biaxin 500 mg twice daily for 10 days.  Recommend stopping Lipitor while taking Biaxin.  May take Hycodan if needed for cough.  Rest and drink  fluids.  History of seasickness-patient will take Bonine for upcoming cruise in [place]  Essential hypertension stable on current regimen  History of migraine headaches treated with Imitrex  Hypothyroidism treated with thyroid replacement medication  History  of sleep apnea treated with CPAP  History of mixed hyperlipidemia treated with statin  Plan: Stop Lipitor while taking Biaxin.  Take Biaxin 500 mg twice daily for 10 days.  Take Bonine for motion sickness if necessary with cruise.  Take Hycodan syrup 1 teaspoon every 8 hours if needed for cough.  Rest and drink fluids.

## 2020-04-26 ENCOUNTER — Other Ambulatory Visit: Payer: Self-pay

## 2020-04-26 MED ORDER — AMLODIPINE BESYLATE 5 MG PO TABS: 5.0000 mg | ORAL_TABLET | Freq: Every day | ORAL | 1 refills | Status: DC

## 2020-05-02 ENCOUNTER — Telehealth (INDEPENDENT_AMBULATORY_CARE_PROVIDER_SITE_OTHER): Payer: PPO | Admitting: Internal Medicine

## 2020-05-02 ENCOUNTER — Ambulatory Visit
Admission: RE | Admit: 2020-05-02 | Discharge: 2020-05-02 | Disposition: A | Discharge status: Home / Self Care | Payer: PPO | Source: Ambulatory Visit | Attending: Internal Medicine | Admitting: Internal Medicine

## 2020-05-02 ENCOUNTER — Encounter: Payer: Self-pay | Admitting: Internal Medicine

## 2020-05-02 ENCOUNTER — Other Ambulatory Visit: Payer: Self-pay

## 2020-05-02 VITALS — BP 140/86 | HR 90 | Temp 100.0°F

## 2020-05-02 DIAGNOSIS — J0100 Acute maxillary sinusitis, unspecified: Secondary | ICD-10-CM | POA: Diagnosis not present

## 2020-05-02 DIAGNOSIS — R059 Cough, unspecified: Secondary | ICD-10-CM

## 2020-05-02 MED ORDER — CLARITHROMYCIN 500 MG PO TABS: 500.0000 mg | ORAL_TABLET | Freq: Two times a day (BID) | ORAL | 1 refills | Status: DC

## 2020-05-02 MED ORDER — PREDNISONE 10 MG PO TABS: ORAL_TABLET | ORAL | 0 refills | Status: DC

## 2020-05-02 NOTE — Telephone Encounter (Signed)
Chest xray ordered. Patient knows to walk in any time at GI. Biaxin refilled.

## 2020-05-02 NOTE — Addendum Note (Signed)
Addended by: Mady Haagensen on: 05/02/2020 10:42 AM   Modules accepted: Orders

## 2020-05-02 NOTE — Telephone Encounter (Signed)
Please order a CXR and refill her antibiotics.

## 2020-05-02 NOTE — Telephone Encounter (Signed)
Diana [name] called to say she had gotten some better, however on Monday she started coughing , running low grade fever, she took home COVID test yesterday that was negative , she finishes the antibiotic today. The cough is deeper but over all the congestion is better.

## 2020-05-09 MED ORDER — ATORVASTATIN CALCIUM 20 MG PO TABS: 20.0000 mg | ORAL_TABLET | Freq: Every day | ORAL | 3 refills | Status: DC

## 2020-05-09 NOTE — Addendum Note (Signed)
Addended by: Fidel Levy on: 05/09/2020 08:18 AM   Modules accepted: Orders

## 2020-05-13 DIAGNOSIS — G4730 Sleep apnea, unspecified: Secondary | ICD-10-CM | POA: Diagnosis not present

## 2020-05-13 DIAGNOSIS — G4733 Obstructive sleep apnea (adult) (pediatric): Secondary | ICD-10-CM | POA: Diagnosis not present

## 2020-05-17 DIAGNOSIS — D1801 Hemangioma of skin and subcutaneous tissue: Secondary | ICD-10-CM | POA: Diagnosis not present

## 2020-05-17 DIAGNOSIS — Z85828 Personal history of other malignant neoplasm of skin: Secondary | ICD-10-CM | POA: Diagnosis not present

## 2020-05-17 DIAGNOSIS — L718 Other rosacea: Secondary | ICD-10-CM | POA: Diagnosis not present

## 2020-05-17 DIAGNOSIS — D2261 Melanocytic nevi of right upper limb, including shoulder: Secondary | ICD-10-CM | POA: Diagnosis not present

## 2020-05-17 DIAGNOSIS — Z85820 Personal history of malignant melanoma of skin: Secondary | ICD-10-CM | POA: Diagnosis not present

## 2020-05-17 DIAGNOSIS — L918 Other hypertrophic disorders of the skin: Secondary | ICD-10-CM | POA: Diagnosis not present

## 2020-05-17 DIAGNOSIS — L738 Other specified follicular disorders: Secondary | ICD-10-CM | POA: Diagnosis not present

## 2020-05-17 DIAGNOSIS — L821 Other seborrheic keratosis: Secondary | ICD-10-CM | POA: Diagnosis not present

## 2020-05-17 DIAGNOSIS — L814 Other melanin hyperpigmentation: Secondary | ICD-10-CM | POA: Diagnosis not present

## 2020-05-17 NOTE — Progress Notes (Addendum)
   Subjective:    Patient ID: Diana [name], female    DOB: 1955/02/19, 65 y.o.   MRN: [id number]  HPI 65 year old Female seen today via interactive audio and video telecommunications due to the Coronavirus pandemic.    Patient was seen April 5 after a trip to the [place] with her husband.  After arriving home she was seen in person with maxillary sinus pressure, headache and nasal congestion as well as periorbital discomfort.  She was diagnosed with acute maxillary sinusitis and treated with Biaxin for 10 days.  Was given Hycodan for cough.    Patient is seen in her home and I am at my office.  She is agreeable to visit in this format today.  She is identified using 2 identifiers as Diana [name], a longstanding patient in this practice.   Despite being treated with an antibiotic which usually works for her she was seen via video visit today regarding persistent symptoms.  Still hands maxillary sinus congestion and discomfort.      Review of Systems denies fever, chills, dysgeusia, or significant cough.     Objective:   Physical Exam  She reports vital signs including temperature of 100 degrees.  Pulse oximetry 97% pulse 90 and blood pressure 140/86.  She looks fatigued on video visit.  She sounds nasally congested.      Assessment & Plan:  Persistent maxillary sinusitis  Plan: Biaxin has been refilled for an additional 10 days 500 mg twice daily.  She has been placed her on tapering course of prednisone 10 mg starting with 60 mg day 1 and decreasing by 10 mg daily in other words 6-5-4-3-2-1 taper of prednisone.  Call if symptoms not improving.

## 2020-05-17 NOTE — Patient Instructions (Signed)
Patient will take prednisone 10 mg tablets starting with 6 tablets day 1 and decreasing by 1 tablet daily i.e. 6-5-4-3-2-1 taper.  Biaxin 500 mg twice daily (number 20 tablets) has been refilled.  Rest and drink fluids.

## 2020-06-24 ENCOUNTER — Other Ambulatory Visit: Payer: Self-pay | Admitting: Internal Medicine

## 2020-07-01 ENCOUNTER — Other Ambulatory Visit (HOSPITAL_COMMUNITY)
Admission: RE | Admit: 2020-07-01 | Discharge: 2020-07-01 | Disposition: A | Discharge status: Home / Self Care | Payer: PPO | Source: Ambulatory Visit | Attending: Gastroenterology | Admitting: Gastroenterology

## 2020-07-01 DIAGNOSIS — Z20822 Contact with and (suspected) exposure to covid-19: Secondary | ICD-10-CM | POA: Diagnosis not present

## 2020-07-01 DIAGNOSIS — Z01812 Encounter for preprocedural laboratory examination: Secondary | ICD-10-CM | POA: Insufficient documentation

## 2020-07-01 LAB — SARS CORONAVIRUS 2 (TAT 6-24 HRS): SARS Coronavirus 2: NEGATIVE

## 2020-07-01 NOTE — Progress Notes (Signed)
Attempted to obtain medical history via telephone, unable to reach at this time. I left a voicemail to return pre surgical testing department's phone call.  

## 2020-07-02 ENCOUNTER — Other Ambulatory Visit: Payer: Self-pay

## 2020-07-03 NOTE — Anesthesia Preprocedure Evaluation (Addendum)
Anesthesia Evaluation  Patient identified by MRN, date of birth, ID band Patient awake    Reviewed: Allergy & Precautions, NPO status , Patient's Chart, lab work & pertinent test results  History of Anesthesia Complications (+) PONV  Airway Mallampati: II  TM Distance: >3 FB Neck ROM: Full    Dental no notable dental hx. (+) Teeth Intact, Dental Advisory Given   Pulmonary sleep apnea ,    Pulmonary exam normal breath sounds clear to auscultation       Cardiovascular hypertension, Pt. on medications Normal cardiovascular exam Rhythm:Regular Rate:Normal  10/20 echo  Left ventricular ejection fraction, by visual estimation, is 55 to 60%. The left ventricle has normal function. There is borderline left ventricular hypertrophy. 2. Left ventricular diastolic parameters are consistent with Grade I diastolic dysfunction (impaired relaxation). 3. Global right ventricle has normal systolic function.The right ventricular size is normal. No increase in right ventricular wall thickness.   Neuro/Psych  Headaches, Anxiety Depression    GI/Hepatic negative GI ROS, Neg liver ROS,   Endo/Other  Hypothyroidism   Renal/GU      Musculoskeletal  (+) Arthritis ,   Abdominal (+) + obese,   Peds  Hematology   Anesthesia Other Findings   Reproductive/Obstetrics                            Anesthesia Physical Anesthesia Plan  ASA: 3  Anesthesia Plan: MAC   Post-op Pain Management:    Induction:   PONV Risk Score and Plan: Treatment may vary due to age or medical condition  Airway Management Planned: Natural Airway  Additional Equipment: None  Intra-op Plan:   Post-operative Plan:   Informed Consent: I have reviewed the patients History and Physical, chart, labs and discussed the procedure including the risks, benefits and alternatives for the proposed anesthesia with the patient or authorized  representative who has indicated his/her understanding and acceptance.     Dental advisory given  Plan Discussed with: CRNA  Anesthesia Plan Comments: (Screening colonoscopy)       Anesthesia Quick Evaluation

## 2020-07-04 ENCOUNTER — Other Ambulatory Visit: Payer: Self-pay

## 2020-07-04 ENCOUNTER — Ambulatory Visit (HOSPITAL_COMMUNITY)
Admission: RE | Admit: 2020-07-04 | Discharge: 2020-07-04 | Disposition: A | Discharge status: Home / Self Care | Payer: PPO | Attending: Gastroenterology | Admitting: Gastroenterology

## 2020-07-04 ENCOUNTER — Ambulatory Visit (HOSPITAL_COMMUNITY): Payer: PPO | Admitting: Anesthesiology

## 2020-07-04 ENCOUNTER — Encounter (HOSPITAL_COMMUNITY): Payer: Self-pay | Admitting: Gastroenterology

## 2020-07-04 ENCOUNTER — Encounter (HOSPITAL_COMMUNITY)
Admission: RE | Disposition: A | Discharge status: Home / Self Care | Payer: Self-pay | Source: Home / Self Care | Attending: Gastroenterology

## 2020-07-04 DIAGNOSIS — Z85820 Personal history of malignant melanoma of skin: Secondary | ICD-10-CM | POA: Diagnosis not present

## 2020-07-04 DIAGNOSIS — Z1211 Encounter for screening for malignant neoplasm of colon: Secondary | ICD-10-CM | POA: Diagnosis not present

## 2020-07-04 DIAGNOSIS — I10 Essential (primary) hypertension: Secondary | ICD-10-CM | POA: Diagnosis not present

## 2020-07-04 DIAGNOSIS — G4733 Obstructive sleep apnea (adult) (pediatric): Secondary | ICD-10-CM | POA: Diagnosis not present

## 2020-07-04 DIAGNOSIS — Z880 Allergy status to penicillin: Secondary | ICD-10-CM | POA: Insufficient documentation

## 2020-07-04 DIAGNOSIS — K633 Ulcer of intestine: Secondary | ICD-10-CM

## 2020-07-04 DIAGNOSIS — Q438 Other specified congenital malformations of intestine: Secondary | ICD-10-CM | POA: Diagnosis not present

## 2020-07-04 HISTORY — PX: COLONOSCOPY WITH PROPOFOL: SHX5780

## 2020-07-04 HISTORY — PX: BIOPSY: SHX5522

## 2020-07-04 SURGERY — COLONOSCOPY WITH PROPOFOL
Anesthesia: Monitor Anesthesia Care

## 2020-07-04 MED ORDER — PROPOFOL 10 MG/ML IV BOLUS
INTRAVENOUS | Status: DC | PRN
  Administered 2020-07-04 (×3): 20 mg via INTRAVENOUS

## 2020-07-04 MED ORDER — LACTATED RINGERS IV SOLN
INTRAVENOUS | Status: DC
  Administered 2020-07-04: 1000 mL via INTRAVENOUS

## 2020-07-04 MED ORDER — LACTATED RINGERS IV SOLN: INTRAVENOUS | Status: DC | PRN

## 2020-07-04 MED ORDER — PROPOFOL 500 MG/50ML IV EMUL
INTRAVENOUS | Status: DC | PRN
  Administered 2020-07-04: 150 ug/kg/min via INTRAVENOUS

## 2020-07-04 MED ORDER — SODIUM CHLORIDE 0.9 % IV SOLN: INTRAVENOUS | Status: DC

## 2020-07-04 SURGICAL SUPPLY — 22 items

## 2020-07-04 NOTE — Discharge Instructions (Signed)

## 2020-07-04 NOTE — Op Note (Signed)
[facility] Patient Name: Diana [name] Procedure Date: 07/04/2020 MRN: [id number] Attending MD: Thornton Park MD, MD Date of Birth: [birth year] CSN: [id number] Age: 65 Admit Type: Outpatient Procedure:                Colonoscopy Indications:              Screening for colorectal malignant neoplasm                           No known family history of colon cancer or polyps                           History of incomplete colonoscopy with Dr. Olevia Perches                            in 2006                           History of normal colonoscopy with Dr. Olevia Perches in                            2011 except for a redundant colon                           History of difficulty area and post-operative                            nausea and vomiting Providers:                Thornton Park MD, MD, Nelia Shi, RN,                            Fransico Setters Mbumina, Technician Referring MD:              Medicines:                Monitored Anesthesia Care Complications:            No immediate complications. Estimated blood loss:                            Minimal. Estimated Blood Loss:     Estimated blood loss was minimal. Procedure:                Pre-Anesthesia Assessment:                           - Prior to the procedure, a History and Physical                            was performed, and patient medications and                            allergies were reviewed. The patient's tolerance of                            previous anesthesia was also reviewed. The risks  and benefits of the procedure and the sedation                            options and risks were discussed with the patient.                            All questions were answered, and informed consent                            was obtained. Prior Anticoagulants: The patient has                            taken no previous anticoagulant or antiplatelet                            agents.  ASA Grade Assessment: III - A patient with                            severe systemic disease. After reviewing the risks                            and benefits, the patient was deemed in                            satisfactory condition to undergo the procedure.                           After obtaining informed consent, the colonoscope                            was passed under direct vision. Throughout the                            procedure, the patient's blood pressure, pulse, and                            oxygen saturations were monitored continuously. The                            CF-HQ190L ([id number]) Olympus colonoscope was                            introduced through the anus and advanced to the the                            cecum, identified by appendiceal orifice and                            ileocecal valve. The colonoscopy was technically                            difficult and complex due to a redundant colon.  Successful completion of the procedure was aided by                            changing the patient's position, withdrawing and                            reinserting the scope and applying abdominal                            pressure. The patient tolerated the procedure well.                            The quality of the bowel preparation was good. The                            ileocecal valve, appendiceal orifice, and rectum                            were photographed. Scope In: 8:39:51 AM Scope Out: 9:04:06 AM Scope Withdrawal Time: 0 hours 13 minutes 2 seconds  Total Procedure Duration: 0 hours 24 minutes 15 seconds  Findings:      The perianal and digital rectal examinations were normal.      The colon was significantly redundant.      Three discrete ulcers were found in the proximal ascending colon, on the       second fold from the IC valve. No bleeding was present. No stigmata of       recent bleeding were seen. Biopsies  were taken with a cold forceps for       histology. Estimated blood loss was minimal.      The remainder of the examined colonic mucosa appeared normal. Biopsies       were taken from the right, transverse, and left colon with a cold       forceps for histology. Estimated blood loss was minimal.      The exam was otherwise without abnormality on direct and retroflexion       views. I was unable to intubate the IC valve and evaluate the terminal       ileum due to the redundancy of the colon. Impression:               - Redundant colon.                           - A few ulcers in the proximal ascending colon.                            Biopsied.                           - The entire examined colon is normal. Biopsied.                           - The examination was otherwise normal on direct                            and retroflexion views. Moderate  Sedation:      Not Applicable - Patient had care per Anesthesia. Recommendation:           - Patient has a contact number available for                            emergencies. The signs and symptoms of potential                            delayed complications were discussed with the                            patient. Return to normal activities tomorrow.                            Written discharge instructions were provided to the                            patient.                           - Resume previous diet.                           - Continue present medications.                           - No aspirin, ibuprofen, naproxen, or other                            non-steroidal anti-inflammatory drugs as this can                            cause colon ulcers.                           - Await pathology results.                           - Office visit to review these results.                           - Repeat colonoscopy date to be determined after                            pending pathology results are reviewed for                             surveillance.                           - Emerging evidence supports eating a diet of                            fruits, vegetables, grains, calcium, and yogurt  while reducing red meat and alcohol may reduce the                            risk of colon cancer.                           - Thank you for allowing me to be involved in your                            colon cancer prevention. Procedure Code(s):        --- Professional ---                           [phone], Colonoscopy, flexible; with biopsy, single                            or multiple Diagnosis Code(s):        --- Professional ---                           Z12.11, Encounter for screening for malignant                            neoplasm of colon                           K63.3, Ulcer of intestine                           Q43.8, Other specified congenital malformations of                            intestine CPT copyright 2019 American Medical Association. All rights reserved. The codes documented in this report are preliminary and upon coder review may  be revised to meet current compliance requirements. Thornton Park MD, MD 07/04/2020 9:15:08 AM This report has been signed electronically. Number of Addenda: 0

## 2020-07-04 NOTE — Transfer of Care (Signed)
Immediate Anesthesia Transfer of Care Note  Patient: Diana [name]  Procedure(s) Performed: COLONOSCOPY WITH PROPOFOL BIOPSY  Patient Location: PACU and Nursing Unit  Anesthesia Type:MAC  Level of Consciousness: awake, alert  and oriented  Airway & Oxygen Therapy: Patient Spontanous Breathing and Patient connected to face mask  Post-op Assessment: Report given to RN and Post -op Vital signs reviewed and stable  Post vital signs: Reviewed and stable  Last Vitals:  Vitals Value Taken Time  BP 134/36 07/04/20 0910  Temp    Pulse 76 07/04/20 0911  Resp 11 07/04/20 0911  SpO2 99 % 07/04/20 0911  Vitals shown include unvalidated device data.  Last Pain:  Vitals:   07/04/20 0750  TempSrc: Oral  PainSc: 0-No pain         Complications: No notable events documented.

## 2020-07-04 NOTE — Anesthesia Postprocedure Evaluation (Signed)
Anesthesia Post Note  Patient: Diana [name]  Procedure(s) Performed: COLONOSCOPY WITH PROPOFOL BIOPSY     Patient location during evaluation: Endoscopy Anesthesia Type: MAC Level of consciousness: awake and alert Pain management: pain level controlled Vital Signs Assessment: post-procedure vital signs reviewed and stable Respiratory status: spontaneous breathing, nonlabored ventilation, respiratory function stable and patient connected to nasal cannula oxygen Cardiovascular status: blood pressure returned to baseline and stable Postop Assessment: no apparent nausea or vomiting Anesthetic complications: no   No notable events documented.  Last Vitals:  Vitals:   07/04/20 0920 07/04/20 0930  BP: (!) 134/38 (!) 138/40  Pulse: 81 73  Resp: 18 19  Temp:    SpO2: 97% 97%    Last Pain:  Vitals:   07/04/20 0920  TempSrc:   PainSc: 0-No pain                 Barnet Glasgow

## 2020-07-04 NOTE — H&P (Signed)
Referring Provider: No ref. provider found Primary Care Physician:  Elby Showers, MD  Reason for Consultation:  Need for colon cancer screening   IMPRESSION:  Need for colon cancer screening Patient reported history of difficult airway No known family history of colon cancer or polyps  PLAN: Screening colonoscopy to be performed at the hospital   HPI: Diana [name] is a 65 y.o. female presents for colon cancer screening. She has a history of a difficult airway. Normal colonoscopy with Dr. Olevia Perches in 2006 and 2011 with notes of a redundant colon. No known family history of colon cancer or polyps. No family history of uterine/endometrial cancer, pancreatic cancer or gastric/stomach cancer.   Past Medical History:  Diagnosis Date   Anxiety    controlled   Atrial septal defect    Small,non significant atrial septal defect with QP/QS of 1.25 by MRA.   Cancer [facility])    melanoma left leg [id number]   Complication of anesthesia    gets migraine when she has to be NPO   Depression    Hyperlipidemia    Hypertension    Pt doesn't take meds and denies having BP issues however her diostolic in alway in high 80s -90s   Hypothyroidism    Migraine    occasionally   Migraine headache    Obesity    Obstructive apnea    CPAP user since 2012.    PONV (postoperative nausea and vomiting)    n/v from migraine, not anesthesia   Primary localized osteoarthritis of left knee 08/02/2018   Pseudotumor cerebri Diagnosed in March 2008-   She sees Dr Clovis Riley in this regard   Sleep apnea    uses CPAP   Statin intolerance    with elevated CRP    Past Surgical History:  Procedure Laterality Date   BACK SURGERY     BUNIONECTOMY     FRACTURE SURGERY     left 5th metatarsal   MASS EXCISION Left 03/22/2018   Procedure: EXCISION CYST DEBRIDEMENT DISTAL INTERPHALANGEAL LEFT MIDDLE FINGER;  Surgeon: Daryll Brod, MD;  Location: [facility];  Service: Orthopedics;  Laterality: Left;  FAB    ORTHOPEDIC SURGERY     lumbar surgery    PARTIAL KNEE ARTHROPLASTY Right 09/15/2016   Procedure: UNICOMPARTMENTAL KNEE;  Surgeon: Marchia Bond, MD;  Location: [facility];  Service: Orthopedics;  Laterality: Right;   PARTIAL KNEE ARTHROPLASTY Left 08/02/2018   Procedure: UNICOMPARTMENTAL KNEE;  Surgeon: Marchia Bond, MD;  Location: [facility];  Service: Orthopedics;  Laterality: Left;   pin in the fifth metatarsal     right knee arthroscopy      Current Facility-Administered Medications  Medication Dose Route Frequency Provider Last Rate Last Admin   0.9 %  sodium chloride infusion   Intravenous Continuous Thornton Park, MD        Allergies as of 03/29/2020 - Review Complete 03/25/2020  Allergen Reaction Noted   Augmentin [amoxicillin-pot clavulanate] Nausea And Vomiting and Other (See Comments) 09/01/2016    Family History  Problem Relation Age of Onset   Alzheimer's disease Mother    Rheumatic fever Father    Colon cancer Neg Hx    Esophageal cancer Neg Hx    Stomach cancer Neg Hx    Colon polyps Neg Hx       Physical Exam: General:   Alert,  well-nourished, pleasant and cooperative in NAD Head:  Normocephalic and atraumatic. Eyes:  Sclera clear, no icterus.   Conjunctiva pink.  Ears:  Normal auditory acuity. Nose:  No deformity, discharge,  or lesions. Mouth:  No deformity or lesions.   Neck:  Supple; no masses or thyromegaly. Lungs:  Clear throughout to auscultation.   No wheezes. Heart:  Regular rate and rhythm; no murmurs. Abdomen:  Soft,nontender, nondistended, normal bowel sounds, no rebound or guarding. No hepatosplenomegaly.   Rectal:  Deferred  Msk:  Symmetrical. No boney deformities LAD: No inguinal or umbilical LAD Extremities:  No clubbing or edema. Neurologic:  Alert and  oriented x4;  grossly nonfocal Skin:  Intact without significant lesions or rashes. Psych:  Alert and cooperative. Normal mood and affect.     Maliik Karner L. Tarri Glenn, MD,  MPH 07/04/2020, 7:57 AM

## 2020-07-05 LAB — SURGICAL PATHOLOGY

## 2020-07-11 ENCOUNTER — Other Ambulatory Visit: Payer: Self-pay

## 2020-07-11 MED ORDER — LEVOTHYROXINE SODIUM 50 MCG PO TABS: 50.0000 ug | ORAL_TABLET | Freq: Every day | ORAL | 1 refills | Status: DC

## 2020-07-30 ENCOUNTER — Telehealth: Payer: PRIVATE HEALTH INSURANCE | Admitting: Family Medicine

## 2020-08-19 ENCOUNTER — Telehealth: Payer: Self-pay | Admitting: Neurology

## 2020-08-19 ENCOUNTER — Telehealth: Payer: Self-pay | Admitting: Family Medicine

## 2020-08-19 NOTE — Telephone Encounter (Signed)
Pt called, wanted to speak with nurse about having issues downloading CPAP information needed for the appt tomorrow.

## 2020-08-19 NOTE — Telephone Encounter (Signed)
The patient to discuss her video visit scheduled for 08/20/20.  Upon reviewing her download online, I do not see up-to-date information.  Called the patient to ask if she could bring her machine in so we can do a download prior to the visit or she can just change the visit to an office appointment where she can regularly seen to the visit and we do a download at that time.  Advised the patient to give [place] a call back.  **If she calls back please ask how she would like to proceed forward with getting a download from her machine for her yearly visit.

## 2020-08-19 NOTE — Telephone Encounter (Signed)
Called the patient back she will come to visit tomorrow and will bring the machine and power cord when she comes. [employer] machine  Pt called, wanted to speak with nurse about having issues downloading CPAP information needed for the appt tomorrow.

## 2020-08-19 NOTE — Progress Notes (Signed)
PATIENT: Diana [name] DOB: [birth year]  REASON FOR VISIT: follow up HISTORY FROM: patient  Chief Complaint  Patient presents with   Obstructive Sleep Apnea    Rm 1, alone. Here for cpap f/u, pt reports doing well, no issues or concerns today.      HISTORY OF PRESENT ILLNESS: 08/20/2020 ALL: Diana [name] returns for follow up for OSA on CPAP. She continues to do very well on therapy. She is using her machine or her travel machine every night. She denies concerns with CPAP or supplies.   Compliance report dated 07/21/2020-8/02-2020 combining her home CPAP and travel information shows that she used CPAP every night for greater than 4 hours. Average usage was about 8 hours. Residual AHI was 0.7 on 8-15cmH20. No significant leak noted.   07/20/2019 ALL:  Diana [name] is a 65 y.o. female here today for follow up for OSA on CPAP.  She is doing very well with CPAP therapy.  She uses her machine every night.  She denies any concerns or issues with her machine.  Compliance report dated 06/19/2019 through 07/18/2019 reveals that she used CPAP every day for compliance of 100%.  She used CPAP greater than 4 hours every day.  Average usage was 7 hours and 13 minutes.  Residual AHI was 0.7 on 8 to 15 cm of water and an EPR of 1.  There was no significant leak noted.  She is feeling well today and without concerns.  HISTORY: (copied from my note on 07/04/2018)  Diana [name] is a 65 y.o. female here today for follow up of OSA on CPAP.  She is doing very well with CPAP therapy.  She is using her machine every night.  She states that she cannot sleep without it.  She is in need of new supplies.   06/04/2018 - 07/03/2018 07/03/2018 Usage days 30/30 days (100%) >= 4 hours 30 days (100%) < 4 hours 0 days (0%) Usage hours 231 hours 31 minutes Average usage (total days) 7 hours 43 minutes Average usage (days used) 7 hours 43 minutes Median usage (days used) 7 hours 43 minutes Total used hours (value  since last reset - 07/03/2018) 7,298 hours AirSense 10 AutoSet Serial number [id number] Mode AutoSet Min Pressure 8 cmH2O Max Pressure 15 cmH2O EPR Fulltime EPR level 1 Response Standard Therapy Pressure - cmH2O Median: 9.0 95th percentile: 11.4 Maximum: 12.9 Leaks - L/min Median: 1.0 95th percentile: 13.2 Maximum: 21.4 Events per hour AI: 1.3 HI: 0.0 AHI: 1.3 Apnea Index Central: 0.1 Obstructive: 1.2 Unknown: 0.0 RERA Index 0.0 Cheyne-Stokes respiration (average duration per night) 0 minutes (0%)       History (copied from Dr Edwena Felty note on 04/08/2017)   HPI: Diana [name] is a 65  y.o. female seen here at [facility] as a referral from Dr. Caprice Beaver. She used to follow Dr. Erling Cruz, who signed her CPAP orders, but she was never scheduled for a formal sleep evaluation.  Mrs. Um underwent a split study on 9-28 2012. She has seen me yearly since then and has an AutoSet CPAP machine. Her sleep habits have not changed. She still not taking naps, she is not a shift worker, she usually goes to bed between 10 and 11 PM and rises at 6.00 in the morning, Most of the nights she will need an alarm to wake up in the morning. She will be received fresh and restored after a nocturnal sleep of about 7-7-1/2 hours.    07-29-12 for  a follow up  visit : The patient used the auto titrator between . Average daily usage was 7 hours and 42 minutes, which is a very good result. Residual apnea was 0.9 or. She had very minor air leaks and her 95th percentile pressure on CPAP was 10 cm the window or for this Ultracet was between 7 and 15 cm ater she endorsed the Epworth sleepiness score it only took points today. I would like for her to have the machine set at 10 cm water with a peak TR function. The patient has always been compliant with the CPAP but this is in [place] port at the CPAP actually has reduced her apnea to a single-digit orbital single-digit level. She's 100% compliant. She had been placed on an  auto-titrator before the SPLIT study and slept well with it. She tried changing to a different sleep setting of 9 cm water, and resumed afterwards her old autoset CPAP machine.  The patient never had a tonsillectomy. During her youth on [place] used a Health and safety inspector for retrognathia. She has no TMJ. She has a deformed anterior chest wall, Her estimated nocturnal sleep time about 7 hours.  She  does not complain of nocturia, as no breakthrough snoring or witnessed apneas.  The patient's on the cardiology history is a patent foramen ovale, she has or a body weight of 239 pounds at 5 feet 8 inches. Since the last sleep study she first gained over 60 pounds and lost already 25. College years the patient has never worked shift work again., She does not take daytime naps. The patient is using her 65 year old autotitrator machine. She received a new one after her sleep study end of 2012 but was unable to tolerate it.  Fam. history: Her father has a history of excessive daytime sleepiness loud snoring and she witnessed several times apneas , also he was never formally diagnosed. He lived to be 95.    09-11-2015 Diana [name] presents today brokenhearted as her CPAP machine has a tight. He has used a AutoSet advantage the S8 machine for over 9 years. She has been a very compliant CPAP user with 100% compliance up to early August 2017 average user time of 7 hours 11 minutes, AutoSet is allowing pressures between 10 and 15 cm water was 1 cm EPR and the residual AHI was 1.2. Air leaks were very mild 91st percentile pressure was 12 cm water. I will sign a prescription and forwarded to [facility] for a new CPAP machine I would like her to have an air sense machine with an O2 sat between 8 and 15 cm water and 1 cm EPR. I will ask Graciella Belton to set the machine for her. She endorsed today the Epworth sleepiness score at 2 points, she does not endorse a high fatigue, she is not clinically depressed. She just  would like to start CPAP.    Interval history on 04/02/2016, Diana [name] is here for her regular CPAP compliance visits and she has done excellent. 100% compliant 7 hours and 3 minutes average user time for the last 30 days, she's using an AutoSet between 8 and 15 cm water, and the 95th percentile pressure is 13.1 cm at night. The residual apneas is 0.6 per hour. Her husband believes that he heard some breakthrough snoring at night. 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    Interval history from 08 April 2017, I have the pleasure of meeting today with  Diana [name], a 65 year old Caucasian CPAP user ,with a 93% compliance for the last 30 days.  Average use of time 6 hours and 55 minutes on CPAP nightly, the machine is an AutoSet between 8 and 15 cmH2O pressure was 1 cm expiratory pressure relief.  Residual AHI is 0.7, 95th percentile pressure is 10.8, there are no central apneas emerging, there is no high air leak.  The patient has benefited from CPAP therapy as her Epworth sleepiness score is endorsed at only 2 points and her fatigue severity at 10 points. She has noted how bad she felt when the power failed and she was without CPAP. She has a second, older CPAP machine at her second residence -    REVIEW OF SYSTEMS: Out of a complete 14 system review of symptoms, the patient complains only of the following symptoms, none and all other reviewed systems are negative.  ESS: 3  ALLERGIES: Allergies  Allergen Reactions   Augmentin [Amoxicillin-Pot Clavulanate] Nausea And Vomiting and Other (See Comments)    Has patient had a PCN reaction causing immediate rash, facial/tongue/throat swelling, SOB or lightheadedness with hypotension: No Has patient had a PCN reaction causing severe rash involving mucus membranes or skin necrosis: No Has patient had a PCN reaction that required hospitalization: No Has patient had a PCN reaction occurring within the last 10 years: #  #  #  YES  #   #  #  > 2014 If all of the above answers are "NO", then may proceed with Cephalosporin use.     HOME MEDICATIONS: Outpatient Medications Prior to Visit  Medication Sig Dispense Refill   ALPRAZolam (XANAX) 0.5 MG tablet Take 0.5 mg by mouth daily as needed for anxiety.  0   amLODipine (NORVASC) 5 MG tablet Take 1 tablet (5 mg total) by mouth daily. 90 tablet 1   Aspirin 81 MG CAPS Take 81 mg by mouth daily.     cetirizine (ZYRTEC) 10 MG tablet Take 10 mg by mouth daily.     Coenzyme Q10 (COQ-10) 100 MG CAPS Take 100 mg by mouth daily.     levothyroxine (SYNTHROID) 50 MCG tablet Take 1 tablet (50 mcg total) by mouth daily. 90 tablet 1   PRISTIQ 100 MG 24 hr tablet Take 100 mg by mouth daily.  1   SUMAtriptan (IMITREX) 100 MG tablet TAKE 1 TABLET BY MOUTH ONCE. MAY REPEAT IN 2 HOURS IF HEADACHE PERSISTS OR RECURS. MAX OF 2 TABLETS IN 24 HOURS. 27 tablet 2   Vitamin D, Ergocalciferol, (DRISDOL) 1.25 MG (50000 UNIT) CAPS capsule TAKE 1 CAPSULE BY MOUTH ONE TIME PER WEEK 13 capsule 0   atorvastatin (LIPITOR) 20 MG tablet Take 1 tablet (20 mg total) by mouth at bedtime. 90 tablet 3   ibuprofen (ADVIL) 200 MG tablet Take 400 mg by mouth every 6 (six) hours as needed for headache or moderate pain.     No facility-administered medications prior to visit.    PAST MEDICAL HISTORY: Past Medical History:  Diagnosis Date   Anxiety    controlled   Atrial septal defect    Small,non significant atrial septal defect with QP/QS of 1.25 by MRA.   Cancer [facility])    melanoma left leg [id number]   Complication of anesthesia    gets migraine when she has to be NPO   Depression    Hyperlipidemia    Hypertension    Pt doesn't take meds and denies having BP issues however her diostolic in  [place] in high 80s -90s   Hypothyroidism    Migraine    occasionally   Migraine headache    Obesity    Obstructive apnea    CPAP user since 2012.    PONV (postoperative nausea and vomiting)    n/v from migraine, not  anesthesia   Primary localized osteoarthritis of left knee 08/02/2018   Pseudotumor cerebri Diagnosed in March 2008-   She sees Dr Clovis Riley in this regard   Sleep apnea    uses CPAP   Statin intolerance    with elevated CRP    PAST SURGICAL HISTORY: Past Surgical History:  Procedure Laterality Date   BACK SURGERY     BIOPSY  07/04/2020   Procedure: BIOPSY;  Surgeon: Thornton Park, MD;  Location: [facility];  Service: Gastroenterology;;   BUNIONECTOMY     COLONOSCOPY WITH PROPOFOL N/A 07/04/2020   Procedure: COLONOSCOPY WITH PROPOFOL;  Surgeon: Thornton Park, MD;  Location: [facility];  Service: Gastroenterology;  Laterality: N/A;   FRACTURE SURGERY     left 5th metatarsal   MASS EXCISION Left 03/22/2018   Procedure: EXCISION CYST DEBRIDEMENT DISTAL INTERPHALANGEAL LEFT MIDDLE FINGER;  Surgeon: Daryll Brod, MD;  Location: [facility];  Service: Orthopedics;  Laterality: Left;  FAB   ORTHOPEDIC SURGERY     lumbar surgery    PARTIAL KNEE ARTHROPLASTY Right 09/15/2016   Procedure: UNICOMPARTMENTAL KNEE;  Surgeon: Marchia Bond, MD;  [facility];  Service: Orthopedics;  Laterality: Right;   PARTIAL KNEE ARTHROPLASTY Left 08/02/2018   Procedure: UNICOMPARTMENTAL KNEE;  Surgeon: Marchia Bond, MD;  Location: [facility];  Service: Orthopedics;  Laterality: Left;   pin in the fifth metatarsal     right knee arthroscopy      FAMILY HISTORY: Family History  Problem Relation Age of Onset   Alzheimer's disease Mother    Rheumatic fever Father    Colon cancer Neg Hx    Esophageal cancer Neg Hx    Stomach cancer Neg Hx    Colon polyps Neg Hx     SOCIAL HISTORY: Social History   Socioeconomic History   Marital status: Married    Spouse name: Diana [name]   Number of children: 0   Years of education: masters   Highest education level: Not on file  Occupational History    Employer: [employer]    Comment: Charity fundraiser  Tobacco Use   Smoking status:  Never   Smokeless tobacco: Never  Vaping Use   Vaping Use: Never used  Substance and Sexual Activity   Alcohol use: Yes    Comment: occasionally   Drug use: No   Sexual activity: Not on file  Other Topics Concern   Not on file  Social History Narrative   Diana [name] is a 66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 She was referred for evaluation of what was previously been diagnosed as an ASD in 2001 at [facility]. She was advised to have it followed medically. She is marrried and lives with her spouse . She may have two glasses  of wine a week. She does not use tobacco. She sleeps with a CPAP.   Caffeine Use: 1-2 cups daily   Social Determinants of Health   Financial Resource Strain: Not on file  Food Insecurity: Not on file  Transportation Needs: Not on file  Physical Activity: Not on file  Stress: Not on file  Social Connections: Not on file  Intimate Partner Violence: Not  on file      PHYSICAL EXAM  Vitals:   08/20/20 0811  BP: (!) 143/81  Pulse: 87  Weight: (!) 317 lb (143.8 kg)  Height: '5\' 9"'$  (1.753 m)   Body mass index is 46.81 kg/m.  Generalized: Well developed, in no acute distress  Cardiology: normal rate and rhythm, no murmur noted Respiratory: clear to auscultation bilaterally  Neurological examination  Mentation: Alert oriented to time, place, history taking. Follows all commands speech and language fluent Cranial nerve II-XII: Pupils were equal round reactive to light. Extraocular movements were full, visual field were full  Motor: The motor testing reveals 5 over 5 strength of all 4 extremities. Good symmetric motor tone is noted throughout.  Gait and station: Gait is normal.   DIAGNOSTIC DATA (LABS, IMAGING, TESTING) - I reviewed patient records, labs, notes, testing and imaging myself where available.  No flowsheet data found.   Lab Results  Component Value Date   WBC 5.8 11/30/2019   HGB 13.4 11/30/2019   HCT 39.6 11/30/2019   MCV  87.8 11/30/2019   PLT 221 11/30/2019      Component Value Date/Time   NA 142 11/30/2019 0927   K 4.7 11/30/2019 0927   CL 104 11/30/2019 0927   CO2 30 11/30/2019 0927   GLUCOSE 100 (H) 11/30/2019 0927   BUN 17 11/30/2019 0927   CREATININE 0.93 11/30/2019 0927   CALCIUM 9.3 11/30/2019 0927   PROT 7.0 11/30/2019 0927   ALBUMIN 4.2 02/13/2016 1115   AST 20 11/30/2019 0927   ALT 20 11/30/2019 0927   ALKPHOS 73 02/13/2016 1115   BILITOT 0.5 11/30/2019 0927   GFRNONAA 64 11/30/2019 0927   GFRAA 74 11/30/2019 0927   Lab Results  Component Value Date   CHOL 246 (H) 11/30/2019   HDL 41 (L) 11/30/2019   LDLCALC 165 (H) 11/30/2019   TRIG 238 (H) 11/30/2019   CHOLHDL 6.0 (H) 11/30/2019   Lab Results  Component Value Date   HGBA1C 5.5 12/08/2019   No results found for: [id number] Lab Results  Component Value Date   TSH 2.26 11/30/2019      ASSESSMENT AND PLAN 65 y.o. year old female  has a past medical history of Anxiety, Atrial septal defect, Cancer ([place]), Complication of anesthesia, Depression, Hyperlipidemia, Hypertension, Hypothyroidism, Migraine, Migraine headache, Obesity, Obstructive apnea, PONV (postoperative nausea and vomiting), Primary localized osteoarthritis of left knee (08/02/2018), Pseudotumor cerebri (Diagnosed in March 2008-), Sleep apnea, and Statin intolerance. here with     ICD-10-CM   1. OSA on CPAP  G47.33    Z99.89       Tarianna is doing very well on CPAP therapy.  Compliance report reveals excellent compliance.  I have encouraged her to continue using CPAP nightly and for greater than 4 hours each night.  We will update supply orders and sent to DME.  Healthy lifestyle habits encouraged.  She will continue close follow-up with primary care.  She will follow-up with me in 1 year, sooner if needed.  She verbalizes understanding and agreement with this plan.   No orders of the defined types were placed in this encounter.    No orders of the defined types  were placed in this encounter.     Debbora Presto, FNP-C 08/20/2020, 8:30 AM [facility] [address] [place], [place] [address] [phone]

## 2020-08-20 ENCOUNTER — Ambulatory Visit: Payer: PPO | Admitting: Family Medicine

## 2020-08-20 ENCOUNTER — Encounter: Payer: Self-pay | Admitting: Family Medicine

## 2020-08-20 VITALS — BP 143/81 | HR 87 | Ht 69.0 in | Wt 317.0 lb

## 2020-08-20 DIAGNOSIS — G4733 Obstructive sleep apnea (adult) (pediatric): Secondary | ICD-10-CM | POA: Diagnosis not present

## 2020-08-20 DIAGNOSIS — Z9989 Dependence on other enabling machines and devices: Secondary | ICD-10-CM

## 2020-08-20 NOTE — Patient Instructions (Signed)

## 2020-08-27 DIAGNOSIS — E782 Mixed hyperlipidemia: Secondary | ICD-10-CM | POA: Diagnosis not present

## 2020-08-28 LAB — LIPID PANEL
Chol/HDL Ratio: 4.2 ratio (ref 0.0–4.4)
Cholesterol, Total: 157 mg/dL (ref 100–199)
HDL: 37 mg/dL — ABNORMAL LOW (ref >39)
LDL Chol Calc (NIH): 95 mg/dL (ref 0–99)
Triglycerides: 142 mg/dL (ref 0–149)
VLDL Cholesterol Cal: 25 mg/dL (ref 5–40)

## 2020-08-29 DIAGNOSIS — F331 Major depressive disorder, recurrent, moderate: Secondary | ICD-10-CM | POA: Diagnosis not present

## 2020-09-04 ENCOUNTER — Other Ambulatory Visit: Payer: Self-pay

## 2020-09-04 ENCOUNTER — Encounter: Payer: Self-pay | Admitting: Internal Medicine

## 2020-09-04 ENCOUNTER — Ambulatory Visit: Payer: PPO | Admitting: Internal Medicine

## 2020-09-04 VITALS — BP 130/80 | HR 84 | Ht 69.0 in | Wt 306.8 lb

## 2020-09-04 DIAGNOSIS — E782 Mixed hyperlipidemia: Secondary | ICD-10-CM | POA: Diagnosis not present

## 2020-09-04 DIAGNOSIS — M791 Myalgia, unspecified site: Secondary | ICD-10-CM

## 2020-09-04 DIAGNOSIS — T466X5D Adverse effect of antihyperlipidemic and antiarteriosclerotic drugs, subsequent encounter: Secondary | ICD-10-CM | POA: Diagnosis not present

## 2020-09-04 NOTE — Patient Instructions (Addendum)
Medication Instructions:  Your Physician recommend you continue on your current medication as directed.    *If you need a refill on your cardiac medications before your next appointment, please call your pharmacy*   Lab Work: Your physician recommends that you return for lab work prior to 6 month appointment (fasting lipid).      Testing/Procedures: None ordered today   Follow-Up: At [facility], you and your health needs are our priority.  As part of our continuing mission to provide you with exceptional heart care, we have created designated Provider Care Teams.  These Care Teams include your primary Cardiologist (physician) and Advanced Practice Providers (APPs -  Physician Assistants and Nurse Practitioners) who all work together to provide you with the care you need, when you need it.  We recommend signing up for the patient portal called "MyChart".  Sign up information is provided on this After Visit Summary.  MyChart is used to connect with patients for Virtual Visits (Telemedicine).  Patients are able to view lab/test results, encounter notes, upcoming appointments, etc.  Non-urgent messages can be sent to your provider as well.   To learn more about what you can do with MyChart, go to [email or web address].    Your next appointment:   6 months  The format for your next appointment:   In Person  Provider:   K. Mali Hilty, MD

## 2020-09-04 NOTE — Progress Notes (Signed)
OFFICE NOTE  Chief Complaint:  Follow-up lipids  Primary Care Physician: Elby Showers, MD  HPI:  Diana [name] is a 65 y.o. female seen today for lipid evaluation at the request of Dr. Renold Genta.  She is a pleasant 65 year old female who was remotely followed by Dr. Doreatha Lew and then Dr. Burt Knack for a small secundum atrial septal defect.  She has not had this repaired.  Other medical problems include morbid obesity, obstructive sleep apnea on CPAP and hypothyroidism.  She denies any significant history of hypertension, diabetes and no real significant coronary disease history.  Both of her parents she has had lived into their 85s.  He does have a history of dyslipidemia which is in her assessment primarily diet driven.  She said many years ago did her cholesterol was much lower about 150 for total cholesterol however it subsequently has risen with significant weight gain.  She is also having problems ambulating and has an upcoming knee surgery scheduled.  She hopes to work on additional weight loss and dietary changes after that.  She says she is under a lot of stress and is a "stress eater".  She is trying a number of commercial diets.  Profile a month ago showed total cholesterol 253, HDL 40, triglycerides 223 and LDL of 175.  This represents mixed dyslipidemia and typical pattern for metabolic syndrome.  This is also an atherogenic lipid profile.  She has previously taken rosuvastatin which caused significant myalgias and recently was on pravastatin which she said caused sleep disturbance and was not tolerated.  She does not have any known coronary disease history.  She did have a vascular screen profile done a couple of years ago which showed some mild right internal carotid plaque.  11/03/2018  [name] returns today for follow-up.  We did perform a calcium score which was 0.  Overall I think this gives [place] some reassurance with regards to her lipids however if she persistently has elevated  lipids then she will be at risk for coronary disease in the future.  I would now recommend aggressive work on her diet and weight loss.  She has been working with a dietitian however is gained about 15 pounds since I last saw her.  She says her "body is out of whack".  Hopefully this can be better controlled.  06/01/2019  [name] returns today for follow-up.  He has had several health issues over the past several months including coronavirus in January.  She received antibiotic therapy for this.  She had a ready received her initial Pfizer vaccine.  She feels like just now she started to recover from this.  Unfortunately weight has not come down a lot and her cholesterol profile is essentially unchanged with LDL of 176.  We discussed therapeutic options however I do not think that she is really had an opportunity to work on the weight loss and dietary changes she needs to do.  02/26/2020  [name] seen today in follow-up.  She says that this past several months has been difficult.  She has been working more hours than she expected.  She said she finally had to retire from her job.  Unfortunately after we had discussed about try to work on weight loss and dietary changes, this has not been successful.  Weight remains at 322 with a BMI of 47.  She recently has started a weight loss program and exercise and made dietary changes.  After talking with her PCP she is amenable to trying  therapy.  Her lipids have essentially been unchanged with total cholesterol 246 in November, HDL 41, LDL 165 and triglycerides 238.  Her calcium score was 0 however her cardiovascular risk still I believe will remain elevated in the long-term.  I recommended a retrial of statin therapy.  We will target an LDL less than 100.  09/04/2020  [name] returns today for follow-up.  She has had a marked improvement in her lipids.  She is managed to lose about 20 to 30 pounds.  She is working with a commercial weight loss program.  [employer] but  she has been taking atorvastatin 20 mg at night and the combination of both is caused a significant improvement in her numbers.  LDL cholesterol which was as high as 165 is now down to 95, total 157, HDL 37 and triglycerides 142.  She seems to be tolerating the medicine well and notes that after changing her diet she has had less pains and arthritis as well.   PMHx:  Past Medical History:  Diagnosis Date   Anxiety    controlled   Atrial septal defect    Small,non significant atrial septal defect with QP/QS of 1.25 by MRA.   Cancer [facility])    melanoma left leg 0000000   Complication of anesthesia    gets migraine when she has to be NPO   Depression    Hyperlipidemia    Hypertension    Pt doesn't take meds and denies having BP issues however her diostolic in alway in high 80s -90s   Hypothyroidism    Migraine    occasionally   Migraine headache    Obesity    Obstructive apnea    CPAP user since 2012.    PONV (postoperative nausea and vomiting)    n/v from migraine, not anesthesia   Primary localized osteoarthritis of left knee 08/02/2018   Pseudotumor cerebri Diagnosed in March 2008-   She sees Dr Clovis Riley in this regard   Sleep apnea    uses CPAP   Statin intolerance    with elevated CRP    Past Surgical History:  Procedure Laterality Date   BACK SURGERY     BIOPSY  07/04/2020   Procedure: BIOPSY;  Surgeon: Thornton Park, MD;  Location: [facility];  Service: Gastroenterology;;   BUNIONECTOMY     COLONOSCOPY WITH PROPOFOL N/A 07/04/2020   Procedure: COLONOSCOPY WITH PROPOFOL;  Surgeon: Thornton Park, MD;  Location: [facility];  Service: Gastroenterology;  Laterality: N/A;   FRACTURE SURGERY     left 5th metatarsal   MASS EXCISION Left 03/22/2018   Procedure: EXCISION CYST DEBRIDEMENT DISTAL INTERPHALANGEAL LEFT MIDDLE FINGER;  Surgeon: Daryll Brod, MD;  Location: [facility];  Service: Orthopedics;  Laterality: Left;  FAB   ORTHOPEDIC SURGERY     lumbar  surgery    PARTIAL KNEE ARTHROPLASTY Right 09/15/2016   Procedure: UNICOMPARTMENTAL KNEE;  Surgeon: Marchia Bond, MD;  Location: [place];  Service: Orthopedics;  Laterality: Right;   PARTIAL KNEE ARTHROPLASTY Left 08/02/2018   Procedure: UNICOMPARTMENTAL KNEE;  Surgeon: Marchia Bond, MD;  Location: [facility];  Service: Orthopedics;  Laterality: Left;   pin in the fifth metatarsal     right knee arthroscopy      FAMHx:  Family History  Problem Relation Age of Onset   Alzheimer's disease Mother    Rheumatic fever Father    Colon cancer Neg Hx    Esophageal cancer Neg Hx    Stomach cancer Neg Hx  Colon polyps Neg Hx     SOCHx:   reports that she has never smoked. She has never used smokeless tobacco. She reports current alcohol use. She reports that she does not use drugs.  ALLERGIES:  Allergies  Allergen Reactions   Augmentin [Amoxicillin-Pot Clavulanate] Nausea And Vomiting and Other (See Comments)    Has patient had a PCN reaction causing immediate rash, facial/tongue/throat swelling, SOB or lightheadedness with hypotension: No Has patient had a PCN reaction causing severe rash involving mucus membranes or skin necrosis: No Has patient had a PCN reaction that required hospitalization: No Has patient had a PCN reaction occurring within the last 10 years: #  #  #  YES  #  #  #  > 2014 If all of the above answers are "NO", then may proceed with Cephalosporin use.     ROS: Pertinent items noted in HPI and remainder of comprehensive ROS otherwise negative.  HOME MEDS: Current Outpatient Medications on File Prior to Visit  Medication Sig Dispense Refill   ALPRAZolam (XANAX) 0.5 MG tablet Take 0.5 mg by mouth daily as needed for anxiety.  0   amLODipine (NORVASC) 5 MG tablet Take 1 tablet (5 mg total) by mouth daily. 90 tablet 1   Aspirin 81 MG CAPS Take 81 mg by mouth daily.     cetirizine (ZYRTEC) 10 MG tablet Take 10 mg by mouth daily.     Coenzyme Q10 (COQ-10) 100 MG CAPS  Take 100 mg by mouth daily.     levothyroxine (SYNTHROID) 50 MCG tablet Take 1 tablet (50 mcg total) by mouth daily. 90 tablet 1   PRISTIQ 100 MG 24 hr tablet Take 100 mg by mouth daily.  1   SUMAtriptan (IMITREX) 100 MG tablet TAKE 1 TABLET BY MOUTH ONCE. MAY REPEAT IN 2 HOURS IF HEADACHE PERSISTS OR RECURS. MAX OF 2 TABLETS IN 24 HOURS. (Patient taking differently: AS NEEDED TAKE 1 TABLET BY MOUTH ONCE. MAY REPEAT IN 2 HOURS IF HEADACHE PERSISTS OR RECURS. MAX OF 2 TABLETS IN 24 HOURS) 27 tablet 2   Vitamin D, Ergocalciferol, (DRISDOL) 1.25 MG (50000 UNIT) CAPS capsule TAKE 1 CAPSULE BY MOUTH ONE TIME PER WEEK 13 capsule 0   atorvastatin (LIPITOR) 20 MG tablet Take 1 tablet (20 mg total) by mouth at bedtime. 90 tablet 3   No current facility-administered medications on file prior to visit.    LABS/IMAGING: No results found for this or any previous visit (from the past 48 hour(s)). No results found.  LIPID PANEL:    Component Value Date/Time   CHOL 157 08/27/2020 1210   TRIG 142 08/27/2020 1210   HDL 37 (L) 08/27/2020 1210   CHOLHDL 4.2 08/27/2020 1210   CHOLHDL 6.0 (H) 11/30/2019 0927   VLDL 35 (H) 02/13/2016 1115   LDLCALC 95 08/27/2020 1210   LDLCALC 165 (H) 11/30/2019 0927     WEIGHTS: Wt Readings from Last 3 Encounters:  09/04/20 (!) 306 lb 12.8 oz (139.2 kg)  08/20/20 (!) 317 lb (143.8 kg)  07/02/20 300 lb (136.1 kg)    VITALS: BP 130/80 (BP Location: Left Arm)   Pulse 84   Ht '5\' 9"'$  (1.753 m)   Wt (!) 306 lb 12.8 oz (139.2 kg)   SpO2 97%   BMI 45.31 kg/m   EXAM: Deferred  EKG: deferred  ASSESSMENT: Mixed dyslipidemia - zero CAC score (07/2018), goal LDL less than 100 Statin intolerance - tolerating atorvastatin Morbid obesity Mild carotid artery disease History of  small secundum ASD  PLAN: 1.   Ms. Huebert has had some recent significant weight loss as well as seems to be tolerating atorvastatin.  The combination of both dietary changes weight loss and  medication is caused a significant improvement in her lipids.  She is now at target with LDL less than 100.  I recommend continuing her current dose of atorvastatin for now however we may be able to REDUCE-IT at some point in the future if she has continued weight loss.  Plan follow-up with me in 6 months with repeat lipids.  Pixie Casino, MD, [facility], [place] Director of the [facility] Lipid Disorders &  Cardiovascular [facility] Reduction Clinic Diplomate of the American Board of Clinical Lipidology Attending Cardiologist  Direct Dial: [phone]  Fax: [phone]  Website:  [email or web address] Kassebaum 09/04/2020, 10:30 AM

## 2020-10-10 ENCOUNTER — Other Ambulatory Visit: Payer: Self-pay | Admitting: Internal Medicine

## 2020-10-10 DIAGNOSIS — Z1231 Encounter for screening mammogram for malignant neoplasm of breast: Secondary | ICD-10-CM

## 2020-10-29 ENCOUNTER — Other Ambulatory Visit: Payer: Self-pay

## 2020-10-29 ENCOUNTER — Ambulatory Visit
Admission: RE | Admit: 2020-10-29 | Discharge: 2020-10-29 | Disposition: A | Discharge status: Home / Self Care | Payer: PPO | Source: Ambulatory Visit

## 2020-10-29 DIAGNOSIS — Z1231 Encounter for screening mammogram for malignant neoplasm of breast: Secondary | ICD-10-CM

## 2021-01-02 ENCOUNTER — Other Ambulatory Visit: Payer: Self-pay | Admitting: Internal Medicine

## 2021-01-19 ENCOUNTER — Other Ambulatory Visit: Payer: Self-pay | Admitting: Internal Medicine

## 2021-01-24 ENCOUNTER — Other Ambulatory Visit: Payer: PPO | Admitting: Internal Medicine

## 2021-01-24 ENCOUNTER — Other Ambulatory Visit: Payer: Self-pay

## 2021-01-24 DIAGNOSIS — E039 Hypothyroidism, unspecified: Secondary | ICD-10-CM

## 2021-01-25 LAB — TSH: TSH: 2.2 mIU/L (ref 0.40–4.50)

## 2021-01-27 ENCOUNTER — Other Ambulatory Visit: Payer: Self-pay

## 2021-01-27 ENCOUNTER — Encounter: Payer: Self-pay | Admitting: Internal Medicine

## 2021-01-27 ENCOUNTER — Ambulatory Visit (INDEPENDENT_AMBULATORY_CARE_PROVIDER_SITE_OTHER): Payer: PPO | Admitting: Internal Medicine

## 2021-01-27 VITALS — BP 124/62 | HR 86 | Temp 98.3°F

## 2021-01-27 DIAGNOSIS — Z8669 Personal history of other diseases of the nervous system and sense organs: Secondary | ICD-10-CM

## 2021-01-27 DIAGNOSIS — N951 Menopausal and female climacteric states: Secondary | ICD-10-CM

## 2021-01-27 DIAGNOSIS — G4733 Obstructive sleep apnea (adult) (pediatric): Secondary | ICD-10-CM

## 2021-01-27 DIAGNOSIS — E782 Mixed hyperlipidemia: Secondary | ICD-10-CM | POA: Diagnosis not present

## 2021-01-27 DIAGNOSIS — Z9989 Dependence on other enabling machines and devices: Secondary | ICD-10-CM

## 2021-01-27 DIAGNOSIS — E039 Hypothyroidism, unspecified: Secondary | ICD-10-CM | POA: Diagnosis not present

## 2021-01-27 DIAGNOSIS — I10 Essential (primary) hypertension: Secondary | ICD-10-CM

## 2021-01-27 DIAGNOSIS — E559 Vitamin D deficiency, unspecified: Secondary | ICD-10-CM | POA: Diagnosis not present

## 2021-01-27 MED ORDER — CLARITHROMYCIN 500 MG PO TABS: 500.0000 mg | ORAL_TABLET | Freq: Two times a day (BID) | ORAL | 0 refills | Status: DC

## 2021-01-27 MED ORDER — METHYLPREDNISOLONE 4 MG PO TABS: ORAL_TABLET | ORAL | 0 refills | Status: DC

## 2021-01-27 MED ORDER — HYDROCODONE-ACETAMINOPHEN 5-325 MG PO TABS: ORAL_TABLET | ORAL | 0 refills | Status: DC

## 2021-01-27 NOTE — Progress Notes (Signed)
° °  Subjective:    Patient ID: Diana [name], female    DOB: [birth year], 66 y.o.   MRN: [id number]  HPI  66 year old for 6 month recheck. Going to [place] to see [employer]. Recent TSH is WNL on current dose of thyroid medication.  Patient would like to have medications for travel.  Have given her Medrol 4 mg Dosepak to take in tapering course as directed.  Was prescribed hydrocodone APAP 5/325 to take sparingly every 8 hours as needed for pain and Biaxin 500 mg twice daily for 10 days should she get a sinus infection while traveling.  Has had colonoscopy June 2022. Had 3 ulcers in ascending colon and will have repeat study this Summer.Biopsies showed erosion but no colitis.   Still uses CPAP for sleep apnea.  History of vitamin D deficiency and takes high-dose vitamin D weekly  Was seen in April 2022 with sinusitis after traveling to the [place].  Did not have COVID-19.  Was treated with Biaxin for 10 days.  History of essential hypertension.  History of migraine headaches treated with Imitrex.  History of hypothyroidism treated with thyroid replacement medication.  TSH is stable at 2.20  Review of Systems sees Dr. Debara Pickett for lipid management.  Currently on generic Lipitor 20 mg daily and lipid panel is excellent.  Mammogram in October was OK. Will get Shingrix this summer. Bone density ordered. Coronary calcium score was 0. Last 2 D Echo was 2020.     Objective:   Physical Exam  Blood pressure excellent 124/62 pulse 86 regular temperature 98.3 degrees pulse oximetry 97%  Skin warm and dry.  Nodes none.  No thyromegaly.  Neck is supple without adenopathy.  Chest clear to auscultation.  Cardiac exam regular rate and rhythm.  No lower extremity edema.      Assessment & Plan:  She seems happy about retirement and is looking forward to traveling.  Plan: I have prescribed hydrocodone APAP to take sparingly for pain every 8 hours 5/325 # 15 tablets.  Prescribed Medrol Dosepak  to take in tapering course 4 mg tablets starting with 6 tablets day 1 and decreasing by 1 tablet daily i.e. 6-5-4-3-2-1 taper.  This is for traveling.  Also Biaxin 500 mg twice daily for 10 days should she get respiratory infection while traveling.  Hypertension she will continue with Norvasc 5 mg daily  Hypothyroidism-we will continue with levothyroxine 50 mcg daily  History of vitamin D deficiency-we will continue with high-dose vitamin D weekly  Hyperlipidemia we will continue with generic Lipitor 20 mg daily  History of migraine headaches may take Imitrex if needed 100 mg at onset of headache  Plan: Return in 6 months for Medicare wellness and health maintenance exam.

## 2021-02-16 NOTE — Patient Instructions (Addendum)
For overseas travel have prescribed hydrocodone APAP sparingly along with Medrol Dosepak and 10-day course of Biaxin.  Continue with Norvasc for hypertension, continue thyroid replacement medication and high-dose vitamin D weekly.  Continue Lipitor for hyperlipidemia.  May take Imitrex at onset of migraine headaches.  Please return in 6 months for health maintenance exam and Medicare wellness visit.  It was a pleasure to see you today.  Bone density study ordered.

## 2021-04-02 ENCOUNTER — Encounter: Payer: Self-pay | Admitting: Cardiovascular Disease

## 2021-04-02 DIAGNOSIS — Q211 Atrial septal defect, unspecified: Secondary | ICD-10-CM

## 2021-04-02 NOTE — Telephone Encounter (Signed)
Order placed at this time, pt made aware via MyChart ?

## 2021-04-02 NOTE — Telephone Encounter (Signed)
Yes - please order echo for follow-up evaluation of ASD. Thank you ?

## 2021-04-02 NOTE — Telephone Encounter (Signed)
Diana Mocha, MD  ?11/18/2018  6:34 AM EDT   ?  ?Echo study looks good with normal LV and RV size and function. ASD not seen. No indication of significant flow through ASD with normal right heart size. Continue to monitor every 2-3 years. thx  ? ? ?Will route to Dr Burt Knack for approval to order ECHO since it has been 3 years (this October). ?

## 2021-04-11 ENCOUNTER — Telehealth: Payer: PPO | Admitting: Internal Medicine

## 2021-04-17 ENCOUNTER — Ambulatory Visit (HOSPITAL_COMMUNITY): Payer: PPO | Attending: Cardiology

## 2021-04-17 DIAGNOSIS — Q211 Atrial septal defect, unspecified: Secondary | ICD-10-CM | POA: Insufficient documentation

## 2021-04-17 LAB — ECHOCARDIOGRAM COMPLETE
Area-P 1/2: 4.99 cm2
S' Lateral: 2.7 cm

## 2021-04-19 ENCOUNTER — Other Ambulatory Visit: Payer: Self-pay | Admitting: Internal Medicine

## 2021-04-22 ENCOUNTER — Other Ambulatory Visit: Payer: Self-pay | Admitting: Internal Medicine

## 2021-04-30 DIAGNOSIS — L918 Other hypertrophic disorders of the skin: Secondary | ICD-10-CM | POA: Diagnosis not present

## 2021-04-30 DIAGNOSIS — L821 Other seborrheic keratosis: Secondary | ICD-10-CM | POA: Diagnosis not present

## 2021-04-30 DIAGNOSIS — L738 Other specified follicular disorders: Secondary | ICD-10-CM | POA: Diagnosis not present

## 2021-04-30 DIAGNOSIS — D225 Melanocytic nevi of trunk: Secondary | ICD-10-CM | POA: Diagnosis not present

## 2021-04-30 DIAGNOSIS — Z85820 Personal history of malignant melanoma of skin: Secondary | ICD-10-CM | POA: Diagnosis not present

## 2021-04-30 DIAGNOSIS — D1801 Hemangioma of skin and subcutaneous tissue: Secondary | ICD-10-CM | POA: Diagnosis not present

## 2021-04-30 DIAGNOSIS — Z85828 Personal history of other malignant neoplasm of skin: Secondary | ICD-10-CM | POA: Diagnosis not present

## 2021-06-14 ENCOUNTER — Other Ambulatory Visit: Payer: Self-pay | Admitting: Internal Medicine

## 2021-06-27 ENCOUNTER — Other Ambulatory Visit: Payer: Self-pay | Admitting: Internal Medicine

## 2021-06-27 ENCOUNTER — Ambulatory Visit
Admission: RE | Admit: 2021-06-27 | Discharge: 2021-06-27 | Disposition: A | Discharge status: Home / Self Care | Payer: PPO | Source: Ambulatory Visit | Attending: Internal Medicine | Admitting: Internal Medicine

## 2021-06-27 DIAGNOSIS — N951 Menopausal and female climacteric states: Secondary | ICD-10-CM

## 2021-06-27 DIAGNOSIS — E559 Vitamin D deficiency, unspecified: Secondary | ICD-10-CM

## 2021-06-27 DIAGNOSIS — Z780 Asymptomatic menopausal state: Secondary | ICD-10-CM | POA: Diagnosis not present

## 2021-07-16 DIAGNOSIS — L821 Other seborrheic keratosis: Secondary | ICD-10-CM | POA: Diagnosis not present

## 2021-07-16 DIAGNOSIS — L74511 Primary focal hyperhidrosis, face: Secondary | ICD-10-CM | POA: Diagnosis not present

## 2021-07-16 DIAGNOSIS — Z85820 Personal history of malignant melanoma of skin: Secondary | ICD-10-CM | POA: Diagnosis not present

## 2021-07-16 DIAGNOSIS — Z85828 Personal history of other malignant neoplasm of skin: Secondary | ICD-10-CM | POA: Diagnosis not present

## 2021-07-28 ENCOUNTER — Other Ambulatory Visit: Payer: PPO

## 2021-07-28 DIAGNOSIS — E039 Hypothyroidism, unspecified: Secondary | ICD-10-CM

## 2021-07-28 DIAGNOSIS — E782 Mixed hyperlipidemia: Secondary | ICD-10-CM

## 2021-07-28 DIAGNOSIS — I10 Essential (primary) hypertension: Secondary | ICD-10-CM

## 2021-07-28 DIAGNOSIS — E559 Vitamin D deficiency, unspecified: Secondary | ICD-10-CM | POA: Diagnosis not present

## 2021-07-29 LAB — COMPLETE METABOLIC PANEL WITH GFR
AG Ratio: 1.5 (calc) (ref 1.0–2.5)
ALT: 18 U/L (ref 6–29)
AST: 19 U/L (ref 10–35)
Albumin: 4.2 g/dL (ref 3.6–5.1)
Alkaline phosphatase (APISO): 102 U/L (ref 37–153)
BUN: 19 mg/dL (ref 7–25)
CO2: 28 mmol/L (ref 20–32)
Calcium: 8.8 mg/dL (ref 8.6–10.4)
Chloride: 103 mmol/L (ref 98–110)
Creat: 0.91 mg/dL (ref 0.50–1.05)
Globulin: 2.8 g/dL (calc) (ref 1.9–3.7)
Glucose, Bld: 100 mg/dL — ABNORMAL HIGH (ref 65–99)
Potassium: 4.3 mmol/L (ref 3.5–5.3)
Sodium: 141 mmol/L (ref 135–146)
Total Bilirubin: 0.5 mg/dL (ref 0.2–1.2)
Total Protein: 7 g/dL (ref 6.1–8.1)
eGFR: 69 mL/min/{1.73_m2} (ref >=60)

## 2021-07-29 LAB — LIPID PANEL
Cholesterol: 175 mg/dL (ref <200)
HDL: 41 mg/dL — ABNORMAL LOW (ref >=50)
LDL Cholesterol (Calc): 98 mg/dL (calc)
Non-HDL Cholesterol (Calc): 134 mg/dL (calc) — ABNORMAL HIGH (ref <130)
Total CHOL/HDL Ratio: 4.3 (calc) (ref <5.0)
Triglycerides: 239 mg/dL — ABNORMAL HIGH (ref <150)

## 2021-07-29 LAB — CBC WITH DIFFERENTIAL/PLATELET
Absolute Monocytes: 319 cells/uL (ref 200–950)
Basophils Absolute: 52 cells/uL (ref 0–200)
Basophils Relative: 0.9 %
Eosinophils Absolute: 342 cells/uL (ref 15–500)
Eosinophils Relative: 5.9 %
HCT: 40.3 % (ref 35.0–45.0)
Hemoglobin: 13.5 g/dL (ref 11.7–15.5)
Lymphs Abs: 2018 cells/uL (ref 850–3900)
MCH: 29.8 pg (ref 27.0–33.0)
MCHC: 33.5 g/dL (ref 32.0–36.0)
MCV: 89 fL (ref 80.0–100.0)
MPV: 10.3 fL (ref 7.5–12.5)
Monocytes Relative: 5.5 %
Neutro Abs: 3068 cells/uL (ref 1500–7800)
Neutrophils Relative %: 52.9 %
Platelets: 210 10*3/uL (ref 140–400)
RBC: 4.53 10*6/uL (ref 3.80–5.10)
RDW: 12.6 % (ref 11.0–15.0)
Total Lymphocyte: 34.8 %
WBC: 5.8 10*3/uL (ref 3.8–10.8)

## 2021-07-29 LAB — TSH: TSH: 3.56 mIU/L (ref 0.40–4.50)

## 2021-07-29 LAB — VITAMIN D 25 HYDROXY (VIT D DEFICIENCY, FRACTURES): Vit D, 25-Hydroxy: 17 ng/mL — ABNORMAL LOW (ref 30–100)

## 2021-08-04 ENCOUNTER — Encounter: Payer: Self-pay | Admitting: Internal Medicine

## 2021-08-04 ENCOUNTER — Ambulatory Visit (INDEPENDENT_AMBULATORY_CARE_PROVIDER_SITE_OTHER): Payer: PPO | Admitting: Internal Medicine

## 2021-08-04 VITALS — BP 128/78 | HR 94 | Temp 98.7°F | Ht 67.5 in | Wt 327.2 lb

## 2021-08-04 DIAGNOSIS — Z9989 Dependence on other enabling machines and devices: Secondary | ICD-10-CM | POA: Diagnosis not present

## 2021-08-04 DIAGNOSIS — I10 Essential (primary) hypertension: Secondary | ICD-10-CM

## 2021-08-04 DIAGNOSIS — Z8669 Personal history of other diseases of the nervous system and sense organs: Secondary | ICD-10-CM | POA: Diagnosis not present

## 2021-08-04 DIAGNOSIS — R7302 Impaired glucose tolerance (oral): Secondary | ICD-10-CM | POA: Diagnosis not present

## 2021-08-04 DIAGNOSIS — E559 Vitamin D deficiency, unspecified: Secondary | ICD-10-CM | POA: Diagnosis not present

## 2021-08-04 DIAGNOSIS — E039 Hypothyroidism, unspecified: Secondary | ICD-10-CM | POA: Diagnosis not present

## 2021-08-04 DIAGNOSIS — Z8659 Personal history of other mental and behavioral disorders: Secondary | ICD-10-CM | POA: Diagnosis not present

## 2021-08-04 DIAGNOSIS — Z23 Encounter for immunization: Secondary | ICD-10-CM

## 2021-08-04 DIAGNOSIS — E782 Mixed hyperlipidemia: Secondary | ICD-10-CM

## 2021-08-04 DIAGNOSIS — Z6841 Body Mass Index (BMI) 40.0 and over, adult: Secondary | ICD-10-CM

## 2021-08-04 DIAGNOSIS — Z85820 Personal history of malignant melanoma of skin: Secondary | ICD-10-CM | POA: Diagnosis not present

## 2021-08-04 DIAGNOSIS — Q211 Atrial septal defect, unspecified: Secondary | ICD-10-CM

## 2021-08-04 DIAGNOSIS — Z0000 Encounter for general adult medical examination without abnormal findings: Secondary | ICD-10-CM | POA: Diagnosis not present

## 2021-08-04 DIAGNOSIS — G4733 Obstructive sleep apnea (adult) (pediatric): Secondary | ICD-10-CM | POA: Diagnosis not present

## 2021-08-04 LAB — POCT URINALYSIS DIPSTICK
Bilirubin, UA: NEGATIVE
Blood, UA: NEGATIVE
Glucose, UA: NEGATIVE
Ketones, UA: NEGATIVE
Leukocytes, UA: NEGATIVE
Nitrite, UA: NEGATIVE
Protein, UA: NEGATIVE
Spec Grav, UA: 1.02 (ref 1.010–1.025)
Urobilinogen, UA: 0.2 E.U./dL
pH, UA: 6 (ref 5.0–8.0)

## 2021-08-04 MED ORDER — LEVOTHYROXINE SODIUM 75 MCG PO TABS: 75.0000 ug | ORAL_TABLET | Freq: Every day | ORAL | 3 refills | Status: DC

## 2021-08-04 MED ORDER — ERGOCALCIFEROL 1.25 MG (50000 UT) PO CAPS: 50000.0000 [IU] | ORAL_CAPSULE | ORAL | 3 refills | Status: DC

## 2021-08-04 NOTE — Progress Notes (Unsigned)
Annual Wellness Visit     Patient: Diana [name], Female    DOB: 1955/04/30, 66 y.o.   MRN: [id number] Visit Date: 08/04/2021  Chief Complaint  Patient presents with   Medicare Wellness   Subjective She is also here for health maintenance exam and evaluation of medical issues.    Diana [name] is a 66 y.o. Female who presents today for her Annual Medicare wellness Visit.  She has a history of sleep apnea treated with CPAP and followed by Neurology.  Had COVID-19 in January 2021 treated with monoclonal antibody and subsequently developed refractory cough.  She saw Dr. Melvyn Novas, pulmonologist and he felt she had upper airway cough syndrome consistent with postviral syndrome.  Was treated with PPI, prednisone and cough suppressant.  Underwent left unicompartmental partial knee arthroplasty by Dr. Mardelle Matte in July 2020.  History of partial right knee arthroplasty in 2018.  Mammogram and bone density are up to date. Discussed vaccines.  Coronary calcium score was 0   in July 2020.  Followed by Dr. Burt Knack for small secundum ASD and is asymptomatic.  History of mixed hyperlipidemia and prefers not to be on statin medication.  History of vitamin D deficiency in 2021 level was 19 and she was placed on high-dose vitamin D weekly.  Intolerant of Crestor and pravastatin.  Currently on atorvastatin 20 mg daily per Dr. Debara Pickett.  History of migraine headaches treated with Imitrex.  History of allergic rhinitis treated with Zyrtec.  History of anxiety depression managed with Pristiq and Xanax.  Longstanding history of hypothyroidism treated with thyroid replacement medication.  Remote history of impaired glucose tolerance.  No known drug allergies.  History of possible pseudotumor cerebri in 2008.  She saw Dr. Erling Cruz and was thought to have papilledema versus pseudopapilledema with significant elevated opening pressures on lumbar puncture.  Hospitalized in 2007 with volume depletion  due to a viral syndrome and SSRI discontinuation syndrome.  She had upper endoscopy by Dr. Alben Spittle which was normal.  In March 2020 Dr. Fredna Dow removed a mass from her left middle finger right hand consistent with mucoid cyst.  History of basal cell carcinoma on her back in 2012.  History of malignant melanoma left thigh in 2012 treated at [facility].  Had lymph node sampling.  Lesion was Clark level IV.  She is followed by dermatology.  Social history: Married.  Does not smoke.  Seldom drinks alcohol.  Never been pregnant.  Tried fertility treatments without success.  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  Family history: Mother with history of migraine headaches.  Mother died at age 36 with a colonic infarction but had history of Alzheimer's disease.  Father died at age 73 with history of stroke and congestive heart failure.  1 brother in good health.      Patient Care Team: Elby Showers, MD as PCP - General (Internal Medicine) Sherren Mocha, MD as PCP - Cardiology (Cardiology)  Review of Systems denies chest pain, shortness of breath, malaise, fatigue   Objective    Vitals: Blood pressure 128/78, pulse 94 regular, temperature 98.7 degrees pulse oximetry 97% weight 327 pounds 4 ounces BMI 50.50  Physical Exam skin: Warm and dry.  No cervical adenopathy.  No thyromegaly.  No carotid bruits.  Chest is clear.  Breast are without masses.  Cardiac exam: Regular rate and rhythm without ectopy.  Abdomen is soft nondistended without hepatosplenomegaly masses or tenderness.  No lower extremity pitting  edema.  Affect thought and judgment are normal.   Most recent functional status assessment:    08/04/2021   10:57 AM  In your present state of health, do you have any difficulty performing the following activities:  Hearing? 0  Vision? 0  Difficulty concentrating or making decisions? 0  Walking or climbing stairs? 0  Dressing or bathing? 0  Doing  errands, shopping? 0  Preparing Food and eating ? N  Using the Toilet? N  In the past six months, have you accidently leaked urine? N  Do you have problems with loss of bowel control? N  Managing your Medications? N  Managing your Finances? N  Housekeeping or managing your Housekeeping? N   Most recent fall risk assessment:    08/04/2021   10:57 AM  Fall Risk   Falls in the past year? 0  Number falls in past yr: 0  Injury with Fall? 0  Risk for fall due to : No Fall Risks  Follow up Falls evaluation completed    Most recent depression screenings:    08/04/2021   10:57 AM 01/27/2021    3:39 PM  PHQ 2/9 Scores  PHQ - 2 Score 0 0   Most recent cognitive screening:    08/04/2021   10:58 AM  6CIT Screen  What Year? 0 points  What month? 0 points  What time? 0 points  Count back from 20 0 points  Months in reverse 0 points  Repeat phrase 0 points  Total Score 0 points       Assessment & Plan     Annual wellness visit done today including the all of the following: Reviewed patient's Family Medical History Reviewed and updated list of patient's medical providers Assessment of cognitive impairment was done Assessed patient's functional ability Established a written schedule for health screening [place] Completed and Reviewed  Discussed health benefits of physical activity, and encouraged her to engage in regular exercise appropriate for her age and condition.         IElby Showers, MD, have reviewed all documentation for this visit. The documentation on 09/16/21 for the exam, diagnosis, procedures, and orders are all accurate and complete.   Angus Seller, CMA

## 2021-08-19 ENCOUNTER — Telehealth: Payer: Self-pay

## 2021-08-19 NOTE — Progress Notes (Unsigned)
PATIENT: Diana [name] DOB: Dec 18, 1955  REASON FOR VISIT: follow up HISTORY FROM: patient  No chief complaint on file.    HISTORY OF PRESENT ILLNESS:  08/20/2021 ALL: Diana [name] returns for follow up for OSA on CPAP.   08/20/2020 ALL: Diana [name] returns for follow up for OSA on CPAP. She continues to do very well on therapy. She is using her machine or her travel machine every night. She denies concerns with CPAP or supplies.   Compliance report dated 07/21/2020-8/02-2020 combining her home CPAP and travel information shows that she used CPAP every night for greater than 4 hours. Average usage was about 8 hours. Residual AHI was 0.7 on 8-15cmH20. No significant leak noted.   07/20/2019 ALL:  Diana [name] is a 66 y.o. female here today for follow up for OSA on CPAP.  She is doing very well with CPAP therapy.  She uses her machine every night.  She denies any concerns or issues with her machine.  Compliance report dated 06/19/2019 through 07/18/2019 reveals that she used CPAP every day for compliance of 100%.  She used CPAP greater than 4 hours every day.  Average usage was 7 hours and 13 minutes.  Residual AHI was 0.7 on 8 to 15 cm of water and an EPR of 1.  There was no significant leak noted.  She is feeling well today and without concerns.  HISTORY: (copied from my note on 07/04/2018)  Diana [name] is a 66 y.o. female here today for follow up of OSA on CPAP.  She is doing very well with CPAP therapy.  She is using her machine every night.  She states that she cannot sleep without it.  She is in need of new supplies.   06/04/2018 - 07/03/2018 07/03/2018 Usage days 30/30 days (100%) >= 4 hours 30 days (100%) < 4 hours 0 days (0%) Usage hours 231 hours 31 minutes Average usage (total days) 7 hours 43 minutes Average usage (days used) 7 hours 43 minutes Median usage (days used) 7 hours 43 minutes Total used hours (value since last reset - 07/03/2018) 7,298 hours AirSense 10  AutoSet Serial number [id number] Mode AutoSet Min Pressure 8 cmH2O Max Pressure 15 cmH2O EPR Fulltime EPR level 1 Response Standard Therapy Pressure - cmH2O Median: 9.0 95th percentile: 11.4 Maximum: 12.9 Leaks - L/min Median: 1.0 95th percentile: 13.2 Maximum: 21.4 Events per hour AI: 1.3 HI: 0.0 AHI: 1.3 Apnea Index Central: 0.1 Obstructive: 1.2 Unknown: 0.0 RERA Index 0.0 Cheyne-Stokes respiration (average duration per night) 0 minutes (0%)       History (copied from Dr Edwena Felty note on 04/08/2017)   HPI: Diana [name] is a 66  y.o. female seen here at [facility] as a referral from Dr. Caprice Beaver. She used to follow Dr. Erling Cruz, who signed her CPAP orders, but she was never scheduled for a formal sleep evaluation.  Mrs. Gallicchio underwent a split study on 9-28 2012. She has seen me yearly since then and has an AutoSet CPAP machine. Her sleep habits have not changed. She still not taking naps, she is not a shift worker, she usually goes to bed between 10 and 11 PM and rises at 6.00 in the morning, Most of the nights she will need an alarm to wake up in the morning. She will be received fresh and restored after a nocturnal sleep of about 7-7-1/2 hours.    07-29-12 for a follow up  visit : The patient used the auto titrator between .  Average daily usage was 7 hours and 42 minutes, which is a very good result. Residual apnea was 0.9 or. She had very minor air leaks and her 95th percentile pressure on CPAP was 10 cm the window or for this Ultracet was between 7 and 15 cm ater she endorsed the Epworth sleepiness score it only took points today. I would like for her to have the machine set at 10 cm water with a peak TR function. The patient has always been compliant with the CPAP but this is in [place] port at the CPAP actually has reduced her apnea to a single-digit orbital single-digit level. She's 100% compliant. She had been placed on an auto-titrator before the SPLIT study and slept well with it.  She tried changing to a different sleep setting of 9 cm water, and resumed afterwards her old autoset CPAP machine.  The patient never had a tonsillectomy. During her youth on [place] used a Health and safety inspector for retrognathia. She has no TMJ. She has a deformed anterior chest wall, Her estimated nocturnal sleep time about 7 hours.  She  does not complain of nocturia, as no breakthrough snoring or witnessed apneas.  The patient's on the cardiology history is a patent foramen ovale, she has or a body weight of 239 pounds at 5 feet 8 inches. Since the last sleep study she first gained over 60 pounds and lost already 25. College years the patient has never worked shift work again., She does not take daytime naps. The patient is using her 66 year old autotitrator machine. She received a new one after her sleep study end of 2012 but was unable to tolerate it.  Fam. history: Her father has a history of excessive daytime sleepiness loud snoring and she witnessed several times apneas , also he was never formally diagnosed. He lived to be 95.    09-11-2015 Diana [name] presents today brokenhearted as her CPAP machine has a tight. He has used a AutoSet advantage the S8 machine for over 9 years. She has been a very compliant CPAP user with 100% compliance up to early August 2017 average user time of 7 hours 11 minutes, AutoSet is allowing pressures between 10 and 15 cm water was 1 cm EPR and the residual AHI was 1.2. Air leaks were very mild 91st percentile pressure was 12 cm water. I will sign a prescription and forwarded to [facility] for a new CPAP machine I would like her to have an air sense machine with an O2 sat between 8 and 15 cm water and 1 cm EPR. I will ask Graciella Belton to set the machine for her. She endorsed today the Epworth sleepiness score at 2 points, she does not endorse a high fatigue, she is not clinically depressed. She just would like to start CPAP.    Interval history on 04/02/2016,  Diana [name] is here for her regular CPAP compliance visits and she has done excellent. 100% compliant 7 hours and 3 minutes average user time for the last 30 days, she's using an AutoSet between 8 and 15 cm water, and the 95th percentile pressure is 13.1 cm at night. The residual apneas is 0.6 per hour. Her husband believes that he heard some breakthrough snoring at night. 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    Interval history from 08 April 2017, I have the pleasure of meeting today with Diana [name], a 66 year old Caucasian CPAP user ,with a 93% compliance for the  last 30 days.  Average use of time 6 hours and 55 minutes on CPAP nightly, the machine is an AutoSet between 8 and 15 cmH2O pressure was 1 cm expiratory pressure relief.  Residual AHI is 0.7, 95th percentile pressure is 10.8, there are no central apneas emerging, there is no high air leak.  The patient has benefited from CPAP therapy as her Epworth sleepiness score is endorsed at only 2 points and her fatigue severity at 10 points. She has noted how bad she felt when the power failed and she was without CPAP. She has a second, older CPAP machine at her second residence -    REVIEW OF SYSTEMS: Out of a complete 14 system review of symptoms, the patient complains only of the following symptoms, none and all other reviewed systems are negative.  ESS: 3  ALLERGIES: Allergies  Allergen Reactions   Augmentin [Amoxicillin-Pot Clavulanate] Nausea And Vomiting and Other (See Comments)    Has patient had a PCN reaction causing immediate rash, facial/tongue/throat swelling, SOB or lightheadedness with hypotension: No Has patient had a PCN reaction causing severe rash involving mucus membranes or skin necrosis: No Has patient had a PCN reaction that required hospitalization: No Has patient had a PCN reaction occurring within the last 10 years: #  #  #  YES  #  #  #  > 2014 If all of the above answers are "NO", then may  proceed with Cephalosporin use.     HOME MEDICATIONS: Outpatient Medications Prior to Visit  Medication Sig Dispense Refill   ALPRAZolam (XANAX) 0.5 MG tablet Take 0.5 mg by mouth daily as needed for anxiety.  0   amLODipine (NORVASC) 5 MG tablet TAKE 1 TABLET (5 MG TOTAL) BY MOUTH DAILY. 90 tablet 1   Aspirin 81 MG CAPS Take 81 mg by mouth daily.     atorvastatin (LIPITOR) 20 MG tablet TAKE 1 TABLET BY MOUTH EVERYDAY AT BEDTIME 90 tablet 3   cetirizine (ZYRTEC) 10 MG tablet Take 10 mg by mouth daily.     Coenzyme Q10 (COQ-10) 100 MG CAPS Take 100 mg by mouth daily.     ergocalciferol (DRISDOL) 1.25 MG (50000 UT) capsule Take 1 capsule (50,000 Units total) by mouth once a week. 12 capsule 3   levothyroxine (SYNTHROID) 75 MCG tablet Take 1 tablet (75 mcg total) by mouth daily. 90 tablet 3   PRISTIQ 100 MG 24 hr tablet Take 100 mg by mouth daily.  1   SUMAtriptan (IMITREX) 100 MG tablet TAKE 1 TABLET BY MOUTH ONCE. MAY REPEAT IN 2 HOURS IF HEADACHE PERSISTS OR RECURS. MAX OF 2 TABLETS IN 24 HOURS. (Patient taking differently: AS NEEDED TAKE 1 TABLET BY MOUTH ONCE. MAY REPEAT IN 2 HOURS IF HEADACHE PERSISTS OR RECURS. MAX OF 2 TABLETS IN 24 HOURS) 27 tablet 2   No facility-administered medications prior to visit.    PAST MEDICAL HISTORY: Past Medical History:  Diagnosis Date   Anxiety    controlled   Atrial septal defect    Small,non significant atrial septal defect with QP/QS of 1.25 by MRA.   Cancer [facility])    melanoma left leg 8250   Complication of anesthesia    gets migraine when she has to be NPO   Depression    Hyperlipidemia    Hypertension    Pt doesn't take meds and denies having BP issues however her diostolic in alway in high 80s -90s   Hypothyroidism    Migraine  occasionally   Migraine headache    Obesity    Obstructive apnea    CPAP user since 2012.    PONV (postoperative nausea and vomiting)    n/v from migraine, not anesthesia   Primary localized  osteoarthritis of left knee 08/02/2018   Pseudotumor cerebri Diagnosed in March 2008-   She sees Dr Clovis Riley in this regard   Sleep apnea    uses CPAP   Statin intolerance    with elevated CRP    PAST SURGICAL HISTORY: Past Surgical History:  Procedure Laterality Date   BACK SURGERY     BIOPSY  07/04/2020   Procedure: BIOPSY;  Surgeon: Thornton Park, MD;  Location: [facility];  Service: Gastroenterology;;   BUNIONECTOMY     COLONOSCOPY WITH PROPOFOL N/A 07/04/2020   Procedure: COLONOSCOPY WITH PROPOFOL;  Surgeon: Thornton Park, MD;  Location: [facility];  Service: Gastroenterology;  Laterality: N/A;   FRACTURE SURGERY     left 5th metatarsal   MASS EXCISION Left 03/22/2018   Procedure: EXCISION CYST DEBRIDEMENT DISTAL INTERPHALANGEAL LEFT MIDDLE FINGER;  Surgeon: Daryll Brod, MD;  Location: [place];  Service: Orthopedics;  Laterality: Left;  FAB   ORTHOPEDIC SURGERY     lumbar surgery    PARTIAL KNEE ARTHROPLASTY Right 09/15/2016   Procedure: UNICOMPARTMENTAL KNEE;  Surgeon: Marchia Bond, MD;  Location: [place];  Service: Orthopedics;  Laterality: Right;   PARTIAL KNEE ARTHROPLASTY Left 08/02/2018   Procedure: UNICOMPARTMENTAL KNEE;  Surgeon: Marchia Bond, MD;  Location: [facility];  Service: Orthopedics;  Laterality: Left;   pin in the fifth metatarsal     right knee arthroscopy      FAMILY HISTORY: Family History  Problem Relation Age of Onset   Alzheimer's disease Mother    Rheumatic fever Father    Colon cancer Neg Hx    Esophageal cancer Neg Hx    Stomach cancer Neg Hx    Colon polyps Neg Hx     SOCIAL HISTORY: Social History   Socioeconomic History   Marital status: Married    Spouse name: Jeneen Rinks   Number of children: 0   Years of education: masters   Highest education level: Not on file  Occupational History    Employer: [employer]    Comment: Charity fundraiser  Tobacco Use   Smoking status: Never   Smokeless tobacco:  Never  Vaping Use   Vaping Use: Never used  Substance and Sexual Activity   Alcohol use: Yes    Comment: occasionally   Drug use: No   Sexual activity: Not on file  Other Topics Concern   Not on file  Social History Narrative   Diana [name] is a 66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 She was referred for evaluation of what was previously been diagnosed as an ASD in 2001 at [facility]. She was advised to have it followed medically. She is marrried and lives with her spouse . She may have two glasses  of wine a week. She does not use tobacco. She sleeps with a CPAP.   Caffeine Use: 1-2 cups daily   Social Determinants of Health   Financial Resource Strain: Not on file  Food Insecurity: Not on file  Transportation Needs: Not on file  Physical Activity: Not on file  Stress: Not on file  Social Connections: Not on file  Intimate Partner Violence: Not on file      PHYSICAL EXAM  There were no vitals filed  for this visit.  There is no height or weight on file to calculate BMI.  Generalized: Well developed, in no acute distress  Cardiology: normal rate and rhythm, no murmur noted Respiratory: clear to auscultation bilaterally  Neurological examination  Mentation: Alert oriented to time, place, history taking. Follows all commands speech and language fluent Cranial nerve II-XII: Pupils were equal round reactive to light. Extraocular movements were full, visual field were full  Motor: The motor testing reveals 5 over 5 strength of all 4 extremities. Good symmetric motor tone is noted throughout.  Gait and station: Gait is normal.   DIAGNOSTIC DATA (LABS, IMAGING, TESTING) - I reviewed patient records, labs, notes, testing and imaging myself where available.      No data to display           Lab Results  Component Value Date   WBC 5.8 07/28/2021   HGB 13.5 07/28/2021   HCT 40.3 07/28/2021   MCV 89.0 07/28/2021   PLT 210 07/28/2021      Component Value  Date/Time   NA 141 07/28/2021 0946   K 4.3 07/28/2021 0946   CL 103 07/28/2021 0946   CO2 28 07/28/2021 0946   GLUCOSE 100 (H) 07/28/2021 0946   BUN 19 07/28/2021 0946   CREATININE 0.91 07/28/2021 0946   CALCIUM 8.8 07/28/2021 0946   PROT 7.0 07/28/2021 0946   ALBUMIN 4.2 02/13/2016 1115   AST 19 07/28/2021 0946   ALT 18 07/28/2021 0946   ALKPHOS 73 02/13/2016 1115   BILITOT 0.5 07/28/2021 0946   GFRNONAA 64 11/30/2019 0927   GFRAA 74 11/30/2019 0927   Lab Results  Component Value Date   CHOL 175 07/28/2021   HDL 41 (L) 07/28/2021   LDLCALC 98 07/28/2021   TRIG 239 (H) 07/28/2021   CHOLHDL 4.3 07/28/2021   Lab Results  Component Value Date   HGBA1C 5.5 12/08/2019   No results found for: "VITAMINB12" Lab Results  Component Value Date   TSH 3.56 07/28/2021      ASSESSMENT AND PLAN 66 y.o. year old female  has a past medical history of Anxiety, Atrial septal defect, Cancer ([place]), Complication of anesthesia, Depression, Hyperlipidemia, Hypertension, Hypothyroidism, Migraine, Migraine headache, Obesity, Obstructive apnea, PONV (postoperative nausea and vomiting), Primary localized osteoarthritis of left knee (08/02/2018), Pseudotumor cerebri (Diagnosed in March 2008-), Sleep apnea, and Statin intolerance. here with   No diagnosis found.   Tayli is doing very well on CPAP therapy.  Compliance report reveals excellent compliance.  I have encouraged her to continue using CPAP nightly and for greater than 4 hours each night.  We will update supply orders and sent to DME.  Healthy lifestyle habits encouraged.  She will continue close follow-up with primary care.  She will follow-up with me in 1 year, sooner if needed.  She verbalizes understanding and agreement with this plan.   No orders of the defined types were placed in this encounter.     No orders of the defined types were placed in this encounter.      Debbora Presto, FNP-C 08/19/2021, 3:18 PM [facility] [address] [place], [place] [address] [phone]

## 2021-08-19 NOTE — Patient Instructions (Incomplete)
Please continue using your CPAP regularly. While your insurance requires that you use CPAP at least 4 hours each night on 70% of the nights, I recommend, that you not skip any nights and use it throughout the night if you can. Getting used to CPAP and staying with the treatment long term does take time and patience and discipline. Untreated obstructive sleep apnea when it is moderate to severe can have an adverse impact on cardiovascular health and raise her risk for heart disease, arrhythmias, hypertension, congestive heart failure, stroke and diabetes. Untreated obstructive sleep apnea causes sleep disruption, nonrestorative sleep, and sleep deprivation. This can have an impact on your day to day functioning and cause daytime sleepiness and impairment of cognitive function, memory loss, mood disturbance, and problems focussing. Using CPAP regularly can improve these symptoms.   Follow up in 31-90 days following set up of new CPAP machine.

## 2021-08-19 NOTE — Telephone Encounter (Signed)
Called and lvm. I also sent pt a myChart message for pt to bring CPAP to appt.

## 2021-08-20 ENCOUNTER — Ambulatory Visit: Payer: PPO | Admitting: Family Medicine

## 2021-08-20 ENCOUNTER — Encounter: Payer: Self-pay | Admitting: Family Medicine

## 2021-08-20 VITALS — BP 160/90 | HR 95 | Ht 68.0 in | Wt 326.0 lb

## 2021-08-20 DIAGNOSIS — Z9989 Dependence on other enabling machines and devices: Secondary | ICD-10-CM

## 2021-08-20 DIAGNOSIS — E782 Mixed hyperlipidemia: Secondary | ICD-10-CM | POA: Diagnosis not present

## 2021-08-20 DIAGNOSIS — G4733 Obstructive sleep apnea (adult) (pediatric): Secondary | ICD-10-CM | POA: Diagnosis not present

## 2021-08-20 LAB — LIPID PANEL
Chol/HDL Ratio: 4.1 ratio (ref 0.0–4.4)
Cholesterol, Total: 171 mg/dL (ref 100–199)
HDL: 42 mg/dL (ref >39)
LDL Chol Calc (NIH): 96 mg/dL (ref 0–99)
Triglycerides: 189 mg/dL — ABNORMAL HIGH (ref 0–149)
VLDL Cholesterol Cal: 33 mg/dL (ref 5–40)

## 2021-08-20 NOTE — Progress Notes (Signed)
Pt has 2 machines.

## 2021-08-21 ENCOUNTER — Telehealth: Payer: Self-pay | Admitting: Family Medicine

## 2021-08-21 DIAGNOSIS — H93293 Other abnormal auditory perceptions, bilateral: Secondary | ICD-10-CM | POA: Diagnosis not present

## 2021-08-21 NOTE — Telephone Encounter (Signed)
HTA pending faxed notes 

## 2021-08-26 ENCOUNTER — Other Ambulatory Visit: Payer: Self-pay

## 2021-08-26 ENCOUNTER — Encounter: Payer: Self-pay | Admitting: Internal Medicine

## 2021-08-26 ENCOUNTER — Telehealth: Payer: PPO | Admitting: Internal Medicine

## 2021-08-26 VITALS — BP 131/71 | HR 88 | Wt 325.0 lb

## 2021-08-26 DIAGNOSIS — T466X5D Adverse effect of antihyperlipidemic and antiarteriosclerotic drugs, subsequent encounter: Secondary | ICD-10-CM

## 2021-08-26 DIAGNOSIS — K633 Ulcer of intestine: Secondary | ICD-10-CM

## 2021-08-26 DIAGNOSIS — T466X5A Adverse effect of antihyperlipidemic and antiarteriosclerotic drugs, initial encounter: Secondary | ICD-10-CM

## 2021-08-26 DIAGNOSIS — E782 Mixed hyperlipidemia: Secondary | ICD-10-CM

## 2021-08-26 DIAGNOSIS — M791 Myalgia, unspecified site: Secondary | ICD-10-CM | POA: Diagnosis not present

## 2021-08-26 NOTE — Patient Instructions (Signed)
Medication Instructions:  NO CHANGES  *If you need a refill on your cardiac medications before your next appointment, please call your pharmacy*   Lab Work: FASTING lab work to check cholesterol in 1 year   If you have labs (blood work) drawn today and your tests are completely normal, you will receive your results only by: [place] (if you have MyChart) OR A paper copy in the mail If you have any lab test that is abnormal or we need to change your treatment, we will call you to review the results.   Testing/Procedures: NONE   Follow-Up: At [facility], you and your health needs are our priority.  As part of our continuing mission to provide you with exceptional heart care, we have created designated Provider Care Teams.  These Care Teams include your primary Cardiologist (physician) and Advanced Practice Providers (APPs -  Physician Assistants and Nurse Practitioners) who all work together to provide you with the care you need, when you need it.  We recommend signing up for the patient portal called "MyChart".  Sign up information is provided on this After Visit Summary.  MyChart is used to connect with patients for Virtual Visits (Telemedicine).  Patients are able to view lab/test results, encounter notes, upcoming appointments, etc.  Non-urgent messages can be sent to your provider as well.   To learn more about what you can do with MyChart, go to [email or web address].    Your next appointment:   12 month(s)  The format for your next appointment:   In Person  Provider:   Lyman Bishop MD - lipid clinic

## 2021-08-26 NOTE — Telephone Encounter (Signed)
Patient has been scheduled for colon at [facility] with Dr. Tarri Glenn on 11/10/21 at 7:30 am. Pre visit scheduled for 10/29/21 at 10:00 am. Pt has been advised on where she will need to go for this appointment. She states she is not on any blood thinners or diabetic. Hospital orders placed.

## 2021-08-26 NOTE — Progress Notes (Signed)
Virtual Visit via Video Note   Because of Diana [name]'s co-morbid illnesses, she is at least at moderate risk for complications without adequate follow up.  This format is felt to be most appropriate for this patient at this time.  All issues noted in this document were discussed and addressed.  A limited physical exam was performed with this format.  Please refer to the patient's chart for her consent to telehealth for [facility].      Date:  08/26/2021   ID:  Diana [name], DOB April 11, 1955, MRN [id number] The patient was identified using 2 identifiers.  Evaluation Performed:  Follow-Up Visit  Patient Location:  [place] [id number]  Provider location:   [address], [place] [place], [place] [address]  PCP:  Elby Showers, MD  Cardiologist:  Sherren Mocha, MD Electrophysiologist:  None   Chief Complaint:  Manage dyslipidemia  History of Present Illness:    Diana [name] is a 66 y.o. female who presents via audio/video conferencing for a telehealth visit today.  She is a pleasant 66 year old female who was remotely followed by Dr. Doreatha Lew and then Dr. Burt Knack for a small secundum atrial septal defect.  She has not had this repaired.  Other medical problems include morbid obesity, obstructive sleep apnea on CPAP and hypothyroidism.  She denies any significant history of hypertension, diabetes and no real significant coronary disease history.  Both of her parents she has had lived into their 75s.  He does have a history of dyslipidemia which is in her assessment primarily diet driven.  She said many years ago did her cholesterol was much lower about 150 for total cholesterol however it subsequently has risen with significant weight gain.  She is also having problems ambulating and has an upcoming knee surgery scheduled.  She hopes to work on additional weight loss and dietary changes after that.  She says she is under a lot of stress and  is a "stress eater".  She is trying a number of commercial diets.  Profile a month ago showed total cholesterol 253, HDL 40, triglycerides 223 and LDL of 175.  This represents mixed dyslipidemia and typical pattern for metabolic syndrome.  This is also an atherogenic lipid profile.  She has previously taken rosuvastatin which caused significant myalgias and recently was on pravastatin which she said caused sleep disturbance and was not tolerated.  She does not have any known coronary disease history.  She did have a vascular screen profile done a couple of years ago which showed some mild right internal carotid plaque.   11/03/2018   Diana [name] returns today for follow-up.  We did perform a calcium score which was 0.  Overall I think this gives [place] some reassurance with regards to her lipids however if she persistently has elevated lipids then she will be at risk for coronary disease in the future.  I would now recommend aggressive work on her diet and weight loss.  She has been working with a dietitian however is gained about 15 pounds since I last saw her.  She says her "body is out of whack".  Hopefully this can be better controlled.   06/01/2019   Diana [name] returns today for follow-up.  He has had several health issues over the past several months including coronavirus in January.  She received antibiotic therapy for this.  She had a ready received her initial Pfizer vaccine.  She feels like just now she started to  recover from this.  Unfortunately weight has not come down a lot and her cholesterol profile is essentially unchanged with LDL of 176.  We discussed therapeutic options however I do not think that she is really had an opportunity to work on the weight loss and dietary changes she needs to do.   02/26/2020   Diana [name] seen today in follow-up.  She says that this past several months has been difficult.  She has been working more hours than she expected.  She said she finally had to retire from her job.   Unfortunately after we had discussed about try to work on weight loss and dietary changes, this has not been successful.  Weight remains at 322 with a BMI of 47.  She recently has started a weight loss program and exercise and made dietary changes.  After talking with her PCP she is amenable to trying therapy.  Her lipids have essentially been unchanged with total cholesterol 246 in November, HDL 41, LDL 165 and triglycerides 238.  Her calcium score was 0 however her cardiovascular risk still I believe will remain elevated in the long-term.  I recommended a retrial of statin therapy.  We will target an LDL less than 100.   09/04/2020   Diana [name] returns today for follow-up.  She has had a marked improvement in her lipids.  She is managed to lose about 20 to 30 pounds.  She is working with a commercial weight loss program.  [employer] but she has been taking atorvastatin 20 mg at night and the combination of both is caused a significant improvement in her numbers.  LDL cholesterol which was as high as 165 is now down to 95, total 157, HDL 37 and triglycerides 142.  She seems to be tolerating the medicine well and notes that after changing her diet she has had less pains and arthritis as well.  08/26/2021  Overall she reports doing well.  She has had about 20 to 25 pound weight gain.  Her lipids however seem to be fairly stable except triglycerides are elevated.  Cholesterol 171, triglycerides 189, HDL 42 and LDL 96.  Prior CV studies:   The following studies were reviewed today:  Labwork  PMHx:  Past Medical History:  Diagnosis Date   Anxiety    controlled   Atrial septal defect    Small,non significant atrial septal defect with QP/QS of 1.25 by MRA.   Cancer [facility])    melanoma left leg 7564   Complication of anesthesia    gets migraine when she has to be NPO   Depression    Hyperlipidemia    Hypertension    Pt doesn't take meds and denies having BP issues however her diostolic in alway in  high 80s -90s   Hypothyroidism    Migraine    occasionally   Migraine headache    Obesity    Obstructive apnea    CPAP user since 2012.    PONV (postoperative nausea and vomiting)    n/v from migraine, not anesthesia   Primary localized osteoarthritis of left knee 08/02/2018   Pseudotumor cerebri Diagnosed in March 2008-   She sees Dr Clovis Riley in this regard   Sleep apnea    uses CPAP   Statin intolerance    with elevated CRP    Past Surgical History:  Procedure Laterality Date   BACK SURGERY     BIOPSY  07/04/2020   Procedure: BIOPSY;  Surgeon: Thornton Park, MD;  Location: [facility];  Service: Gastroenterology;;   Lillard Anes     COLONOSCOPY WITH PROPOFOL N/A 07/04/2020   Procedure: COLONOSCOPY WITH PROPOFOL;  Surgeon: Thornton Park, MD;  Location: [facility];  Service: Gastroenterology;  Laterality: N/A;   FRACTURE SURGERY     left 5th metatarsal   MASS EXCISION Left 03/22/2018   Procedure: EXCISION CYST DEBRIDEMENT DISTAL INTERPHALANGEAL LEFT MIDDLE FINGER;  Surgeon: Daryll Brod, MD;  Location: [facility];  Service: Orthopedics;  Laterality: Left;  FAB   ORTHOPEDIC SURGERY     lumbar surgery    PARTIAL KNEE ARTHROPLASTY Right 09/15/2016   Procedure: UNICOMPARTMENTAL KNEE;  Surgeon: Marchia Bond, MD;  Location: [facility];  Service: Orthopedics;  Laterality: Right;   PARTIAL KNEE ARTHROPLASTY Left 08/02/2018   Procedure: UNICOMPARTMENTAL KNEE;  Surgeon: Marchia Bond, MD;  Location: [facility];  Service: Orthopedics;  Laterality: Left;   pin in the fifth metatarsal     right knee arthroscopy      FAMHx:  Family History  Problem Relation Age of Onset   Alzheimer's disease Mother    Rheumatic fever Father    Colon cancer Neg Hx    Esophageal cancer Neg Hx    Stomach cancer Neg Hx    Colon polyps Neg Hx     SOCHx:   reports that she has never smoked. She has never used smokeless tobacco. She reports current alcohol use. She reports that she does not  use drugs.  ALLERGIES:  Allergies  Allergen Reactions   Augmentin [Amoxicillin-Pot Clavulanate] Nausea And Vomiting and Other (See Comments)    Has patient had a PCN reaction causing immediate rash, facial/tongue/throat swelling, SOB or lightheadedness with hypotension: No Has patient had a PCN reaction causing severe rash involving mucus membranes or skin necrosis: No Has patient had a PCN reaction that required hospitalization: No Has patient had a PCN reaction occurring within the last 10 years: #  #  #  YES  #  #  #  > 2014 If all of the above answers are "NO", then may proceed with Cephalosporin use.     MEDS:  Current Meds  Medication Sig   ALPRAZolam (XANAX) 0.5 MG tablet Take 0.5 mg by mouth daily as needed for anxiety.   amLODipine (NORVASC) 5 MG tablet TAKE 1 TABLET (5 MG TOTAL) BY MOUTH DAILY.   Aspirin 81 MG CAPS Take 81 mg by mouth daily.   atorvastatin (LIPITOR) 20 MG tablet TAKE 1 TABLET BY MOUTH EVERYDAY AT BEDTIME   cetirizine (ZYRTEC) 10 MG tablet Take 10 mg by mouth daily.   Coenzyme Q10 (COQ-10) 100 MG CAPS Take 100 mg by mouth daily.   ergocalciferol (DRISDOL) 1.25 MG (50000 UT) capsule Take 1 capsule (50,000 Units total) by mouth once a week.   levothyroxine (SYNTHROID) 75 MCG tablet Take 1 tablet (75 mcg total) by mouth daily.   PRISTIQ 100 MG 24 hr tablet Take 100 mg by mouth daily.   SUMAtriptan (IMITREX) 100 MG tablet TAKE 1 TABLET BY MOUTH ONCE. MAY REPEAT IN 2 HOURS IF HEADACHE PERSISTS OR RECURS. MAX OF 2 TABLETS IN 24 HOURS. (Patient taking differently: AS NEEDED TAKE 1 TABLET BY MOUTH ONCE. MAY REPEAT IN 2 HOURS IF HEADACHE PERSISTS OR RECURS. MAX OF 2 TABLETS IN 24 HOURS)     ROS: Pertinent items noted in HPI and remainder of comprehensive ROS otherwise negative.  Labs/Other Tests and Data Reviewed:    Recent Labs: 07/28/2021: ALT 18; BUN 19; Creat 0.91; Hemoglobin 13.5; Platelets 210;  Potassium 4.3; Sodium 141; TSH 3.56   Recent Lipid Panel Lab  Results  Component Value Date/Time   CHOL 171 08/20/2021 11:15 AM   TRIG 189 (H) 08/20/2021 11:15 AM   HDL 42 08/20/2021 11:15 AM   CHOLHDL 4.1 08/20/2021 11:15 AM   CHOLHDL 4.3 07/28/2021 09:46 AM   LDLCALC 96 08/20/2021 11:15 AM   LDLCALC 98 07/28/2021 09:46 AM    Wt Readings from Last 3 Encounters:  08/26/21 (!) 325 lb (147.4 kg)  08/20/21 (!) 326 lb (147.9 kg)  08/04/21 (!) 327 lb 4 oz (148.4 kg)     Exam:    Vital Signs:  BP 131/71   Pulse 88   Wt (!) 325 lb (147.4 kg)   BMI 49.42 kg/m    General appearance: alert and no distress Lungs: no wheezing Abdomen: obese Extremities: extremities normal, atraumatic, no cyanosis or edema Neurologic: Grossly normal  ASSESSMENT & PLAN:    Mixed dyslipidemia - zero CAC score (07/2018), goal LDL less than 100 Statin intolerance - tolerating atorvastatin Morbid obesity Mild carotid artery disease History of small secundum ASD  Has had recent 20 to 25 pound weight gain and is starting a supervised weight loss program tomorrow to further address this.  Her lipids have remained fairly stable except for increases in triglycerides.  This will likely improve with weight loss and diet.  No changes to her statin today.  Follow-up with me annually or sooner as necessary.  COVID-19 Education: The signs and symptoms of COVID-19 were discussed with the patient and how to seek care for testing (follow up with PCP or arrange E-visit).  The importance of social distancing was discussed today.  Patient Risk:   After full review of this patients clinical status, I feel that they are at least moderate risk at this time.  Time:   Today, I have spent 15 minutes with the patient with telehealth technology discussing dyslipidemia.     Medication Adjustments/Labs and Tests Ordered: Current medicines are reviewed at length with the patient today.  Concerns regarding medicines are outlined above.   Tests Ordered: Orders Placed This Encounter   Procedures   Lipid panel    Medication Changes: No orders of the defined types were placed in this encounter.   Disposition:  in 1 year(s)  Pixie Casino, MD, [facility], [place] Director of the Advanced Lipid Disorders &  Cardiovascular Risk Reduction Clinic Diplomate of the American Board of Clinical Lipidology Attending Cardiologist  Direct Dial: [phone]  Fax: [phone]  Website:  [email or web address]  Pixie Casino, MD  08/26/2021 9:11 AM

## 2021-08-26 NOTE — Telephone Encounter (Signed)
Patient called wondering if it is necessary for her to schedule a follow up. Please call to advise.

## 2021-08-26 NOTE — Telephone Encounter (Signed)
LVM for pt to call back to schedule sleep study.  

## 2021-08-27 ENCOUNTER — Encounter: Payer: Self-pay | Admitting: Cardiovascular Disease

## 2021-08-27 ENCOUNTER — Ambulatory Visit: Payer: PPO | Admitting: Cardiovascular Disease

## 2021-08-27 VITALS — BP 152/98 | HR 93 | Ht 68.0 in | Wt 330.8 lb

## 2021-08-27 DIAGNOSIS — E782 Mixed hyperlipidemia: Secondary | ICD-10-CM | POA: Diagnosis not present

## 2021-08-27 DIAGNOSIS — I10 Essential (primary) hypertension: Secondary | ICD-10-CM

## 2021-08-27 DIAGNOSIS — Q211 Atrial septal defect, unspecified: Secondary | ICD-10-CM

## 2021-08-27 NOTE — Patient Instructions (Signed)
Medication Instructions:  Your physician recommends that you continue on your current medications as directed. Please refer to the Current Medication list given to you today.  *If you need a refill on your cardiac medications before your next appointment, please call your pharmacy*   Lab Work: NONE If you have labs (blood work) drawn today and your tests are completely normal, you will receive your results only by: MyChart Message (if you have MyChart) OR A paper copy in the mail If you have any lab test that is abnormal or we need to change your treatment, we will call you to review the results.   Testing/Procedures: NONE   Follow-Up: At [facility], you and your health needs are our priority.  As part of our continuing mission to provide you with exceptional heart care, we have created designated Provider Care Teams.  These Care Teams include your primary Cardiologist (physician) and Advanced Practice Providers (APPs -  Physician Assistants and Nurse Practitioners) who all work together to provide you with the care you need, when you need it.  We recommend signing up for the patient portal called "MyChart".  Sign up information is provided on this After Visit Summary.  MyChart is used to connect with patients for Virtual Visits (Telemedicine).  Patients are able to view lab/test results, encounter notes, upcoming appointments, etc.  Non-urgent messages can be sent to your provider as well.   To learn more about what you can do with MyChart, go to [email or web address].    Your next appointment:   1 year(s)  The format for your next appointment:   In Person  Provider:   Michael Cooper, MD      Important Information About Sugar       

## 2021-08-27 NOTE — Progress Notes (Signed)
Cardiology Office Note:    Date:  08/27/2021   ID:  Diana, [name] 03/05/1955, MRN [id number]  PCP:  Elby Showers, MD   [facility] Providers Cardiologist:  Sherren Mocha, MD     Referring MD: Elby Showers, MD   Chief Complaint  Patient presents with   Hypertension    History of Present Illness:    Diana [name] is a 66 y.o. female with a hx of HTN, morbid obesity, and small secundum ASD, presenting for follow-up evaluation. She had a recent echo to follow the ASD and is shown to have normal RV size and function. She remains asymptomatic. Today, she denies symptoms of palpitations, chest pain, shortness of breath, orthopnea, PND, lower extremity edema, dizziness, or syncope. She continues to struggle with her weight but has no other complaints today.   Past Medical History:  Diagnosis Date   Anxiety    controlled   Atrial septal defect    Small,non significant atrial septal defect with QP/QS of 1.25 by MRA.   Cancer [facility])    melanoma left leg 0177   Complication of anesthesia    gets migraine when she has to be NPO   Depression    Hyperlipidemia    Hypertension    Pt doesn't take meds and denies having BP issues however her diostolic in alway in high 80s -90s   Hypothyroidism    Migraine    occasionally   Migraine headache    Obesity    Obstructive apnea    CPAP user since 2012.    PONV (postoperative nausea and vomiting)    n/v from migraine, not anesthesia   Primary localized osteoarthritis of left knee 08/02/2018   Pseudotumor cerebri Diagnosed in March 2008-   She sees Dr Clovis Riley in this regard   Sleep apnea    uses CPAP   Statin intolerance    with elevated CRP    Past Surgical History:  Procedure Laterality Date   BACK SURGERY     BIOPSY  07/04/2020   Procedure: BIOPSY;  Surgeon: Thornton Park, MD;  Location: [facility];  Service: Gastroenterology;;   BUNIONECTOMY     COLONOSCOPY WITH PROPOFOL N/A 07/04/2020   Procedure:  COLONOSCOPY WITH PROPOFOL;  Surgeon: Thornton Park, MD;  Location: [facility];  Service: Gastroenterology;  Laterality: N/A;   FRACTURE SURGERY     left 5th metatarsal   MASS EXCISION Left 03/22/2018   Procedure: EXCISION CYST DEBRIDEMENT DISTAL INTERPHALANGEAL LEFT MIDDLE FINGER;  Surgeon: Daryll Brod, MD;  Location: [facility];  Service: Orthopedics;  Laterality: Left;  FAB   ORTHOPEDIC SURGERY     lumbar surgery    PARTIAL KNEE ARTHROPLASTY Right 09/15/2016   Procedure: UNICOMPARTMENTAL KNEE;  Surgeon: Marchia Bond, MD;  Location: [place];  Service: Orthopedics;  Laterality: Right;   PARTIAL KNEE ARTHROPLASTY Left 08/02/2018   Procedure: UNICOMPARTMENTAL KNEE;  Surgeon: Marchia Bond, MD;  Location: [facility];  Service: Orthopedics;  Laterality: Left;   pin in the fifth metatarsal     right knee arthroscopy      Current Medications: Current Meds  Medication Sig   ALPRAZolam (XANAX) 0.5 MG tablet Take 0.5 mg by mouth daily as needed for anxiety.   amLODipine (NORVASC) 5 MG tablet TAKE 1 TABLET (5 MG TOTAL) BY MOUTH DAILY.   Aspirin 81 MG CAPS Take 81 mg by mouth daily.   atorvastatin (LIPITOR) 20 MG tablet TAKE 1 TABLET BY MOUTH EVERYDAY AT BEDTIME  cetirizine (ZYRTEC) 10 MG tablet Take 10 mg by mouth daily.   Coenzyme Q10 (COQ-10) 100 MG CAPS Take 100 mg by mouth daily.   ergocalciferol (DRISDOL) 1.25 MG (50000 UT) capsule Take 1 capsule (50,000 Units total) by mouth once a week.   levothyroxine (SYNTHROID) 75 MCG tablet Take 1 tablet (75 mcg total) by mouth daily.   PRISTIQ 100 MG 24 hr tablet Take 100 mg by mouth daily.   SUMAtriptan (IMITREX) 100 MG tablet TAKE 1 TABLET BY MOUTH ONCE. MAY REPEAT IN 2 HOURS IF HEADACHE PERSISTS OR RECURS. MAX OF 2 TABLETS IN 24 HOURS. (Patient taking differently: AS NEEDED TAKE 1 TABLET BY MOUTH ONCE. MAY REPEAT IN 2 HOURS IF HEADACHE PERSISTS OR RECURS. MAX OF 2 TABLETS IN 24 HOURS)     Allergies:   Augmentin [amoxicillin-pot  clavulanate]   Social History   Socioeconomic History   Marital status: Married    Spouse name: Jeneen Rinks   Number of children: 0   Years of education: masters   Highest education level: Not on file  Occupational History    Employer: [employer]    Comment: Charity fundraiser  Tobacco Use   Smoking status: Never   Smokeless tobacco: Never  Vaping Use   Vaping Use: Never used  Substance and Sexual Activity   Alcohol use: Yes    Comment: occasionally   Drug use: No   Sexual activity: Not on file  Other Topics Concern   Not on file  Social History Narrative   Diana [name] is a 66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 She was referred for evaluation of what was previously been diagnosed as an ASD in 2001 at [facility]. She was advised to have it followed medically. She is marrried and lives with her spouse . She may have two glasses  of wine a week. She does not use tobacco. She sleeps with a CPAP.   Caffeine Use: 1-2 cups daily   Social Determinants of Health   Financial Resource Strain: Not on file  Food Insecurity: Not on file  Transportation Needs: Not on file  Physical Activity: Not on file  Stress: Not on file  Social Connections: Not on file     Family History: The patient's family history includes Alzheimer's disease in her mother; Rheumatic fever in her father. There is no history of Colon cancer, Esophageal cancer, Stomach cancer, or Colon polyps.  ROS:   Please see the history of present illness.    All other systems reviewed and are negative.  EKGs/Labs/Other Studies Reviewed:    The following studies were reviewed today: Echo 04/17/2021:  1. Left ventricular ejection fraction, by estimation, is 55 to 60%. The  left ventricle has normal function. The left ventricle has no regional  wall motion abnormalities. Left ventricular diastolic parameters were  normal.   2. Right ventricular systolic function is normal. The right ventricular  size  is mildly enlarged. There is normal pulmonary artery systolic  pressure.   3. The mitral valve is normal in structure. No evidence of mitral valve  regurgitation. No evidence of mitral stenosis.   4. The aortic valve is tricuspid. Aortic valve regurgitation is not  visualized. No aortic stenosis is present.   5. The inferior vena cava is normal in size with greater than 50%  respiratory variability, suggesting right atrial pressure of 3 mmHg.   Comparison(s): No significant change from prior study.   EKG:  EKG is ordered today.  The ekg ordered today demonstrates NSR 93 bpm, normal study  Recent Labs: 07/28/2021: ALT 18; BUN 19; Creat 0.91; Hemoglobin 13.5; Platelets 210; Potassium 4.3; Sodium 141; TSH 3.56  Recent Lipid Panel    Component Value Date/Time   CHOL 171 08/20/2021 1115   TRIG 189 (H) 08/20/2021 1115   HDL 42 08/20/2021 1115   CHOLHDL 4.1 08/20/2021 1115   CHOLHDL 4.3 07/28/2021 0946   VLDL 35 (H) 02/13/2016 1115   LDLCALC 96 08/20/2021 1115   LDLCALC 98 07/28/2021 0946     Risk Assessment/Calculations:           Physical Exam:    VS:  BP (!) 152/98   Pulse 93   Ht '5\' 8"'$  (1.727 m)   Wt (!) 330 lb 12.8 oz (150 kg)   SpO2 97%   BMI 50.30 kg/m     Wt Readings from Last 3 Encounters:  08/27/21 (!) 330 lb 12.8 oz (150 kg)  08/26/21 (!) 325 lb (147.4 kg)  08/20/21 (!) 326 lb (147.9 kg)     GEN:  Well nourished, well developed, morbidly obese woman in no acute distress HEENT: Normal NECK: No JVD; No carotid bruits LYMPHATICS: No lymphadenopathy CARDIAC: RRR, no murmurs, rubs, gallops RESPIRATORY:  Clear to auscultation without rales, wheezing or rhonchi  ABDOMEN: Soft, non-tender, non-distended MUSCULOSKELETAL:  No edema; No deformity  SKIN: Warm and dry NEUROLOGIC:  Alert and oriented x 3 PSYCHIATRIC:  Normal affect   ASSESSMENT:    1. ASD (atrial septal defect)   2. Essential hypertension   3. Mixed hyperlipidemia    PLAN:    In order of  problems listed above:  Known to have a small ASD. I have followed her with serial echo studies and there is no increase in RV size. She remains asymptomatic. Will see back in one year for clinical follow-up. Significant component of white coat HTN. Home readings and those in PCP office are within normal limits on amlodipine 5 mg daily. Continue same Rx. Treated with atorvastatin. Followed by PCP. LDL cholesterol 96 mg/dL.            Medication Adjustments/Labs and Tests Ordered: Current medicines are reviewed at length with the patient today.  Concerns regarding medicines are outlined above.  No orders of the defined types were placed in this encounter.  No orders of the defined types were placed in this encounter.   Patient Instructions  Medication Instructions:  Your physician recommends that you continue on your current medications as directed. Please refer to the Current Medication list given to you today.  *If you need a refill on your cardiac medications before your next appointment, please call your pharmacy*   Lab Work: NONE If you have labs (blood work) drawn today and your tests are completely normal, you will receive your results only by: [facility] (if you have MyChart) OR A paper copy in the mail If you have any lab test that is abnormal or we need to change your treatment, we will call you to review the results.   Testing/Procedures: NONE   Follow-Up: At [facility], you and your health needs are our priority.  As part of our continuing mission to provide you with exceptional heart care, we have created designated Provider Care Teams.  These Care Teams include your primary Cardiologist (physician) and Advanced Practice Providers (APPs -  Physician Assistants and Nurse Practitioners) who all work together to provide you with the care you need, when you need it.  We recommend signing up for the patient portal called "MyChart".  Sign up information is  provided on this After Visit Summary.  MyChart is used to connect with patients for Virtual Visits (Telemedicine).  Patients are able to view lab/test results, encounter notes, upcoming appointments, etc.  Non-urgent messages can be sent to your provider as well.   To learn more about what you can do with MyChart, go to [email or web address].    Your next appointment:   1 year(s)  The format for your next appointment:   In Person  Provider:   Sherren Mocha, MD       Important Information About Sugar         Signed, Sherren Mocha, MD  08/27/2021 3:19 PM    [place]

## 2021-08-27 NOTE — Telephone Encounter (Signed)
[name]: [id number] (exp. 08/21/21 to 11/19/21).  Patient is scheduled at [facility] for 09/16/21 at 11:30 AM.  Mailed packet to the patient.

## 2021-09-15 ENCOUNTER — Other Ambulatory Visit: Payer: PPO

## 2021-09-15 DIAGNOSIS — E039 Hypothyroidism, unspecified: Secondary | ICD-10-CM

## 2021-09-16 ENCOUNTER — Encounter: Payer: Self-pay | Admitting: Internal Medicine

## 2021-09-16 ENCOUNTER — Ambulatory Visit: Payer: PPO

## 2021-09-16 ENCOUNTER — Ambulatory Visit: Payer: PPO | Admitting: Neurology

## 2021-09-16 ENCOUNTER — Ambulatory Visit (INDEPENDENT_AMBULATORY_CARE_PROVIDER_SITE_OTHER): Payer: PPO | Admitting: Internal Medicine

## 2021-09-16 VITALS — BP 130/76 | HR 79 | Temp 97.8°F | Wt 326.1 lb

## 2021-09-16 DIAGNOSIS — Z9989 Dependence on other enabling machines and devices: Secondary | ICD-10-CM

## 2021-09-16 DIAGNOSIS — I10 Essential (primary) hypertension: Secondary | ICD-10-CM

## 2021-09-16 DIAGNOSIS — G4733 Obstructive sleep apnea (adult) (pediatric): Secondary | ICD-10-CM | POA: Diagnosis not present

## 2021-09-16 DIAGNOSIS — Z8639 Personal history of other endocrine, nutritional and metabolic disease: Secondary | ICD-10-CM | POA: Diagnosis not present

## 2021-09-16 DIAGNOSIS — E039 Hypothyroidism, unspecified: Secondary | ICD-10-CM

## 2021-09-16 LAB — TSH: TSH: 1.61 mIU/L (ref 0.40–4.50)

## 2021-09-16 NOTE — Patient Instructions (Signed)
TSH is much improved with levothyroxine 75 mcg daily.  Continue this dose and follow-up here in January 2024.  Continue high-dose vitamin D and amlodipine for essential hypertension.  Was a pleasure to see you today.

## 2021-09-16 NOTE — Progress Notes (Signed)
   Subjective:    Patient ID: Diana [name], female    DOB: [birth year], 66 y.o.   MRN: [id number]  HPI 66 year old Female seen for follow-up on TSH that was elevated at last visit.  Levothyroxine was increased to 75 mcg daily in July.  TSH is now 1.61 and previously was 3.56 in July.  She thinks she may feel a bit better on this dose.  Explained to her I would like to keep TSH around 1 if possible.   BP was elevated at Dr. Antionette Char office recently but normal with other visits elsewhere and at home. No change in antihypertensive therapy.  We will be traveling some in the near future.  Review of Systems no new complaints.     Objective:   Physical Exam  Blood pressure 130/76 pulse 79 temperature 97.8 degrees pulse oximetry 98% weight 326 pounds 1.9 ounces BMI of 49.59      Assessment & Plan:   Elevated TSH improved with changing dose of levothyroxine to 75 mcg daily.  TSH is now 1.61 and previously was 3.56  Essential hypertension stable on current regimen consisting of amlodipine 5 mg daily.  History of vitamin D deficiency treated with high-dose vitamin D weekly  Plan: Patient has 75-monthfollow-up appointment in January 2024.

## 2021-09-18 NOTE — Patient Instructions (Addendum)
Take vitamin D weekly.  Continue Pristiq for depression and thyroid replacement medication for hypothyroidism.  Continue amlodipine and atorvastatin.  Pneumococcal 20 vaccine given.  Colonoscopy is up-to-date.  Return in 1 year or as needed.

## 2021-09-22 DIAGNOSIS — Z9989 Dependence on other enabling machines and devices: Secondary | ICD-10-CM | POA: Insufficient documentation

## 2021-09-22 DIAGNOSIS — Z6841 Body Mass Index (BMI) 40.0 and over, adult: Secondary | ICD-10-CM | POA: Insufficient documentation

## 2021-09-22 NOTE — Procedures (Signed)
[facility] TEST REPORT ( by Watch PAT)   STUDY DATA:  09-19-2021   MRN: [id number]   ORDERING CLINICIAN: Larey Seat, MD  REFERRING CLINICIAN: Amy Lomax,NP   CLINICAL INFORMATION/HISTORY: 66 year old female patient, formerly followed by dr Erling Cruz here in the office, first sleep study of record  was on 10-17-2010 . History of : ASD , hypothyroidism and on CPAP for treatment of OSA.  Her CPAP was replaced last in 2017:  Currently using two S 9 auto-CPAPs , of which one is set between 10 and 15 cm water pressure with EPR at 1 cm H20, resulting in a residual AHI of 1.0/h. Recorded were minimal air leak, 95% pressure of 12.8 cm water.  This machine needs to be replaced.  The patient has also a record of therapeutic data from a second CPAP machine,  between 7-3- through 08-19-2021 on when her AHI was 0.6/h  settings here are  8 through 15 cm water,1 cm EPR, 95% pressure was 12.6 cm water. All AHI was central in origin.  The patient considers herself CPAP dependent and alternates between these 2 machines achieving a very high compliance rating.     Epworth sleepiness score: 2/24. FSS; xx/63 points.    BMI: 49.5 kg/m   Neck Circumference: 16"   FINDINGS:   Sleep Summary:   Total Recording Time (hours, min): 7 hours and 29 minutes       Total Sleep Time (hours, min):   6 hours 17 minutes              Percent REM (%):   20.5%                                     Respiratory Indices:   Calculated pAHI (per hour): 24/h                           REM pAHI:    27.4/h                                             NREM pAHI: 23.1/h                            Positional AHI: The majority of the night, about 315 minutes, were spent in supine sleep position with an AHI of 22.5/h followed by left-sided sleep with an AHI of 35.9/h and right lateral sleep for only 25 minutes associated with an AHI of 24/h.  Snoring reached a mean volume of 44 dB and was present for about 60% of  total sleep time, which was moderate to severe.                                                 Oxygen Saturation Statistics:          O2 Saturation Range (%): Between a nadir at 82% and a maximum of 100% oxygen saturation with a mean value at 95%  O2 Saturation (minutes) <89%: 0.1 minutes         Pulse Rate Statistics:   Pulse Mean (bpm):   79 bpm              Pulse Range:       Between 60 to 105 bpm          IMPRESSION:  This HST confirms the presence of obstructive sleep apnea which is not REM sleep dependent and not associated with hypoxia.  Only sleep in left lateral position seems to have exacerbated the AHI somewhat.   RECOMMENDATION: The patient should receive a new ResMed S11 autotitration device is a CPAP pressure setting between 5 and 15 cm water pressure with 1 cm expiratory pressure relief, heated humidification and a mask or interface of her choice. The patient also may want to purchase a travel CPAP with a setting of 13 cm pressure based on current compliance download results.    INTERPRETING PHYSICIAN:   Larey Seat, MD   Medical Director of [facility] at [facility].

## 2021-09-23 ENCOUNTER — Other Ambulatory Visit: Payer: Self-pay | Admitting: Family Medicine

## 2021-09-23 ENCOUNTER — Encounter: Payer: Self-pay | Admitting: Neurology

## 2021-09-23 DIAGNOSIS — G4733 Obstructive sleep apnea (adult) (pediatric): Secondary | ICD-10-CM

## 2021-10-29 ENCOUNTER — Ambulatory Visit (AMBULATORY_SURGERY_CENTER): Payer: Self-pay

## 2021-10-29 VITALS — Ht 68.0 in | Wt 329.0 lb

## 2021-10-29 DIAGNOSIS — K633 Ulcer of intestine: Secondary | ICD-10-CM

## 2021-10-29 MED ORDER — NA SULFATE-K SULFATE-MG SULF 17.5-3.13-1.6 GM/177ML PO SOLN: 1.0000 | Freq: Once | ORAL | 0 refills | Status: AC

## 2021-10-29 NOTE — Progress Notes (Signed)
No egg or soy allergy known to patient  No issues known to pt with past sedation with any surgeries or procedures---other than PONV Patient denies ever being told they had issues or difficulty with intubation --- patient reports she was told as some point in the past that she "has a narrow throat" No FH of Malignant Hyperthermia Pt is not on diet pills Pt is not on home 02  Pt is not on blood thinners  Pt reports issues with constipation - patient reports she has been increasing water intake and changing her diet habits to assist with constipation;  No A fib or A flutter Have any cardiac testing pending--NO Pt instructed to use Singlecare.com or GoodRx for a price reduction on prep

## 2021-10-31 ENCOUNTER — Other Ambulatory Visit: Payer: Self-pay

## 2021-10-31 ENCOUNTER — Encounter (HOSPITAL_COMMUNITY): Payer: Self-pay | Admitting: Gastroenterology

## 2021-11-05 ENCOUNTER — Encounter: Payer: Self-pay | Admitting: Internal Medicine

## 2021-11-05 ENCOUNTER — Telehealth: Payer: Self-pay | Admitting: Internal Medicine

## 2021-11-05 MED ORDER — SUMATRIPTAN SUCCINATE 100 MG PO TABS: ORAL_TABLET | ORAL | 2 refills | Status: DC

## 2021-11-05 NOTE — Telephone Encounter (Signed)
Imitrex refilled as requested. MJB, MD

## 2021-11-07 ENCOUNTER — Other Ambulatory Visit: Payer: Self-pay | Admitting: Internal Medicine

## 2021-11-07 DIAGNOSIS — Z1231 Encounter for screening mammogram for malignant neoplasm of breast: Secondary | ICD-10-CM

## 2021-11-10 ENCOUNTER — Encounter (HOSPITAL_COMMUNITY)
Admission: RE | Disposition: A | Discharge status: Home / Self Care | Payer: Self-pay | Source: Ambulatory Visit | Attending: Gastroenterology

## 2021-11-10 ENCOUNTER — Ambulatory Visit (HOSPITAL_COMMUNITY): Payer: PPO | Admitting: Anesthesiology

## 2021-11-10 ENCOUNTER — Ambulatory Visit (HOSPITAL_BASED_OUTPATIENT_CLINIC_OR_DEPARTMENT_OTHER): Payer: PPO | Admitting: Anesthesiology

## 2021-11-10 ENCOUNTER — Other Ambulatory Visit: Payer: Self-pay

## 2021-11-10 ENCOUNTER — Ambulatory Visit (HOSPITAL_COMMUNITY)
Admission: RE | Admit: 2021-11-10 | Discharge: 2021-11-10 | Disposition: A | Discharge status: Home / Self Care | Payer: PPO | Source: Ambulatory Visit | Attending: Gastroenterology | Admitting: Gastroenterology

## 2021-11-10 DIAGNOSIS — Q438 Other specified congenital malformations of intestine: Secondary | ICD-10-CM | POA: Insufficient documentation

## 2021-11-10 DIAGNOSIS — K635 Polyp of colon: Secondary | ICD-10-CM | POA: Diagnosis not present

## 2021-11-10 DIAGNOSIS — M1990 Unspecified osteoarthritis, unspecified site: Secondary | ICD-10-CM | POA: Diagnosis not present

## 2021-11-10 DIAGNOSIS — K279 Peptic ulcer, site unspecified, unspecified as acute or chronic, without hemorrhage or perforation: Secondary | ICD-10-CM

## 2021-11-10 DIAGNOSIS — Z6841 Body Mass Index (BMI) 40.0 and over, adult: Secondary | ICD-10-CM | POA: Diagnosis not present

## 2021-11-10 DIAGNOSIS — D122 Benign neoplasm of ascending colon: Secondary | ICD-10-CM

## 2021-11-10 DIAGNOSIS — I10 Essential (primary) hypertension: Secondary | ICD-10-CM | POA: Diagnosis not present

## 2021-11-10 DIAGNOSIS — K633 Ulcer of intestine: Secondary | ICD-10-CM

## 2021-11-10 DIAGNOSIS — F418 Other specified anxiety disorders: Secondary | ICD-10-CM | POA: Diagnosis not present

## 2021-11-10 DIAGNOSIS — D120 Benign neoplasm of cecum: Secondary | ICD-10-CM | POA: Diagnosis not present

## 2021-11-10 DIAGNOSIS — Z09 Encounter for follow-up examination after completed treatment for conditions other than malignant neoplasm: Secondary | ICD-10-CM | POA: Insufficient documentation

## 2021-11-10 DIAGNOSIS — Z8711 Personal history of peptic ulcer disease: Secondary | ICD-10-CM | POA: Diagnosis not present

## 2021-11-10 DIAGNOSIS — G4730 Sleep apnea, unspecified: Secondary | ICD-10-CM | POA: Diagnosis not present

## 2021-11-10 HISTORY — PX: COLONOSCOPY WITH PROPOFOL: SHX5780

## 2021-11-10 HISTORY — PX: POLYPECTOMY: SHX5525

## 2021-11-10 SURGERY — COLONOSCOPY WITH PROPOFOL
Anesthesia: Monitor Anesthesia Care

## 2021-11-10 MED ORDER — LIDOCAINE 2% (20 MG/ML) 5 ML SYRINGE
INTRAMUSCULAR | Status: DC | PRN
  Administered 2021-11-10: 60 mg via INTRAVENOUS

## 2021-11-10 MED ORDER — ONDANSETRON HCL 4 MG/2ML IJ SOLN
INTRAMUSCULAR | Status: DC | PRN
  Administered 2021-11-10: 4 mg via INTRAVENOUS

## 2021-11-10 MED ORDER — LACTATED RINGERS IV SOLN: INTRAVENOUS | Status: DC

## 2021-11-10 MED ORDER — PROPOFOL 500 MG/50ML IV EMUL
INTRAVENOUS | Status: DC | PRN
  Administered 2021-11-10: 125 ug/kg/min via INTRAVENOUS

## 2021-11-10 MED ORDER — SODIUM CHLORIDE 0.9 % IV SOLN: INTRAVENOUS | Status: DC

## 2021-11-10 MED ORDER — DEXAMETHASONE SODIUM PHOSPHATE 10 MG/ML IJ SOLN
INTRAMUSCULAR | Status: DC | PRN
  Administered 2021-11-10: 10 mg via INTRAVENOUS

## 2021-11-10 MED ORDER — PROPOFOL 500 MG/50ML IV EMUL
INTRAVENOUS | Status: AC
  Filled 2021-11-10: qty 50

## 2021-11-10 MED ORDER — PROPOFOL 10 MG/ML IV BOLUS
INTRAVENOUS | Status: DC | PRN
  Administered 2021-11-10 (×2): 20 mg via INTRAVENOUS
  Administered 2021-11-10: 30 mg via INTRAVENOUS

## 2021-11-10 MED ORDER — PROPOFOL 10 MG/ML IV BOLUS
INTRAVENOUS | Status: AC
  Filled 2021-11-10: qty 20

## 2021-11-10 SURGICAL SUPPLY — 22 items

## 2021-11-10 NOTE — Anesthesia Procedure Notes (Signed)
Procedure Name: MAC Date/Time: 11/10/2021 7:48 AM  Performed by: Lollie Sails, CRNAPre-anesthesia Checklist: Patient identified, Emergency Drugs available, Suction available, Patient being monitored and Timeout performed Oxygen Delivery Method: Simple face mask Placement Confirmation: positive ETCO2

## 2021-11-10 NOTE — Op Note (Addendum)
[facility] Patient Name: Diana [name] Procedure Date: 11/10/2021 MRN: [id number] Attending MD: Thornton Park MD, MD Date of Birth: [birth year] CSN: [id number] Age: 66 Admit Type: Outpatient Procedure:                Colonoscopy Indications:              Suspected ulcerative colitis                           Incomplete colonoscopy with Dr. Olevia Perches in 2006                           Normal colonoscopy with Dr. Olevia Perches in 2011 except                            for a redundant colon                           Ascending colon ulcers seen on colonoscopy 2022.                            Pathology showed focal changes of erosions.                           Repeat exam recommended in 1 year to follow-up on                            the ulcers.                           She has not used any NSAIDs since last year.                           No GI symptoms. Providers:                Thornton Park MD, MD, Dulcy Fanny, Theodora Blow, Technician Referring MD:              Medicines:                Monitored Anesthesia Care Complications:             Estimated Blood Loss:      Procedure:                Pre-Anesthesia Assessment:                           - Prior to the procedure, a History and Physical                            was performed, and patient medications and                            allergies were reviewed. The patient's tolerance of  previous anesthesia was also reviewed. The risks                            and benefits of the procedure and the sedation                            options and risks were discussed with the patient.                            All questions were answered, and informed consent                            was obtained. Prior Anticoagulants: The patient has                            taken no previous anticoagulant or antiplatelet                            agents. ASA Grade  Assessment: III - A patient with                            severe systemic disease. After reviewing the risks                            and benefits, the patient was deemed in                            satisfactory condition to undergo the procedure.                           After obtaining informed consent, the colonoscope                            was passed under direct vision. Throughout the                            procedure, the patient's blood pressure, pulse, and                            oxygen saturations were monitored continuously. The                            CF-HQ190L ([id number]) Olympus colonoscope was                            introduced through the anus and advanced to the 3                            cm into the ileum. The colonoscopy was technically                            difficult and complex due to a redundant colon and  significant looping. Successful completion of the                            procedure was aided by changing the patient's                            position, withdrawing and reinserting the scope and                            applying abdominal pressure. The patient tolerated                            the procedure well. The quality of the bowel                            preparation was good. The terminal ileum, ileocecal                            valve, appendiceal orifice, and rectum were                            photographed. Scope In: 7:53:55 AM Scope Out: 8:15:06 AM Scope Withdrawal Time: 0 hours 9 minutes 27 seconds  Total Procedure Duration: 0 hours 21 minutes 11 seconds  Findings:      The perianal and digital rectal examinations were normal.      A 2 mm polyp was found in the cecum. The polyp was flat. The polyp was       removed with a cold snare. Resection and retrieval were complete.       Estimated blood loss was minimal.      The colon is tortuous and redundant. The exam was otherwise  without       abnormality on direct and retroflexion views. Impression:               - One 2 mm polyp in the cecum, removed with a cold                            snare. Resected and retrieved.                           - Ulcers seen in 2022 are no longer present.                            Suspect they were related to NSAIDs.                           - Tortuous and redundant colon.                           - The examination was otherwise normal on direct                            and retroflexion views. Moderate Sedation:      Not Applicable - Patient had care per Anesthesia. Recommendation:           -  Patient has a contact number available for                            emergencies. The signs and symptoms of potential                            delayed complications were discussed with the                            patient. Return to normal activities tomorrow.                            Written discharge instructions were provided to the                            patient.                           - Resume previous diet.                           - Continue present medications.                           - Avoid all NSAIDs.                           - Await pathology results.                           - Repeat colonoscopy date to be determined after                            pending pathology results are reviewed for                            surveillance.                           - Emerging evidence supports eating a diet of                            fruits, vegetables, grains, calcium, and yogurt                            while reducing red meat and alcohol may reduce the                            risk of colon cancer. Procedure Code(s):        --- Professional ---                           [phone], Colonoscopy, flexible; with removal of                            tumor(s), polyp(s), or other lesion(s) by snare  technique Diagnosis Code(s):         --- Professional ---                           K63.5, Polyp of colon CPT copyright 2019 American Medical Association. All rights reserved. The codes documented in this report are preliminary and upon coder review may  be revised to meet current compliance requirements. Thornton Park MD, MD 11/10/2021 8:23:37 AM This report has been signed electronically. Number of Addenda: 0

## 2021-11-10 NOTE — Anesthesia Postprocedure Evaluation (Signed)
Anesthesia Post Note  Patient: Diana [name]  Procedure(s) Performed: COLONOSCOPY WITH PROPOFOL POLYPECTOMY     Patient location during evaluation: PACU Anesthesia Type: MAC Level of consciousness: awake and alert Pain management: pain level controlled Vital Signs Assessment: post-procedure vital signs reviewed and stable Respiratory status: spontaneous breathing, nonlabored ventilation and respiratory function stable Cardiovascular status: blood pressure returned to baseline and stable Postop Assessment: no apparent nausea or vomiting Anesthetic complications: no   No notable events documented.  Last Vitals:  Vitals:   11/10/21 0840 11/10/21 0850  BP: (!) 123/47 (!) 139/44  Pulse: 79 79  Resp: 18 16  Temp:    SpO2: 97% 98%    Last Pain:  Vitals:   11/10/21 0850  TempSrc:   PainSc: 0-No pain                 Lynda Rainwater

## 2021-11-10 NOTE — Discharge Instructions (Signed)
YOU HAD AN ENDOSCOPIC PROCEDURE TODAY: Refer to the procedure report and other information in the discharge instructions given to you for any specific questions about what was found during the examination. If this information does not answer your questions, please call [place] office at [phone] to clarify.  ° °YOU SHOULD EXPECT: Some feelings of bloating in the abdomen. Passage of more gas than usual. Walking can help get rid of the air that was put into your GI tract during the procedure and reduce the bloating. If you had a lower endoscopy (such as a colonoscopy or flexible sigmoidoscopy) you may notice spotting of blood in your stool or on the toilet paper. Some abdominal soreness may be present for a day or two, also. ° °DIET: Your first meal following the procedure should be a light meal and then it is ok to progress to your normal diet. A half-sandwich or bowl of soup is an example of a good first meal. Heavy or fried foods are harder to digest and may make you feel nauseous or bloated. Drink plenty of fluids but you should avoid alcoholic beverages for 24 hours. If you had a esophageal dilation, please see attached instructions for diet.   ° °ACTIVITY: Your care partner should take you home directly after the procedure. You should plan to take it easy, moving slowly for the rest of the day. You can resume normal activity the day after the procedure however YOU SHOULD NOT DRIVE, use power tools, machinery or perform tasks that involve climbing or major physical exertion for 24 hours (because of the sedation medicines used during the test).  ° °SYMPTOMS TO REPORT IMMEDIATELY: °A gastroenterologist can be reached at any hour. Please call [phone]  for any of the following symptoms:  °Following lower endoscopy (colonoscopy, flexible sigmoidoscopy) °Excessive amounts of blood in the stool  °Significant tenderness, worsening of abdominal pains  °Swelling of the abdomen that is new, acute  °Fever of 100° or  higher  °Following upper endoscopy (EGD, EUS, ERCP, esophageal dilation) °Vomiting of blood or coffee ground material  °New, significant abdominal pain  °New, significant chest pain or pain under the shoulder blades  °Painful or persistently difficult swallowing  °New shortness of breath  °Black, tarry-looking or red, bloody stools ° °FOLLOW UP:  °If any biopsies were taken you will be contacted by phone or by letter within the next 1-3 weeks. Call [phone]  if you have not heard about the biopsies in 3 weeks.  °Please also call with any specific questions about appointments or follow up tests. ° °

## 2021-11-10 NOTE — H&P (Addendum)
Referring Provider: No ref. provider found Primary Care Physician:  Elby Showers, MD  Indication for Colonoscopy:  Colon cancer screening   IMPRESSION:  History of ascending colon ulcers of unclear etiology Appropriate candidate for monitored anesthesia care  PLAN: Colonoscopy in the [place] today   HPI: Diana [name] is a 66 y.o. female presents for follow-up colonoscopy.  History of incomplete colonoscopy with Dr. Olevia Perches in 2006 History of normal colonoscopy with Dr. Olevia Perches in 2011 except for a redundant colon History of ascending colon ulcers on colonoscopy 07/04/2020. Pathology showed focal changes of erosion. Other colon biopsies were normal.  History of difficulty area and post-operative nausea and vomiting  She has stopped using all NSAIDs since her last colonoscopy.   No known family history of colon cancer or polyps. No family history of uterine/endometrial cancer, pancreatic cancer or gastric/stomach cancer.   Past Medical History:  Diagnosis Date   Anxiety    controlled   Atrial septal defect    Small,non significant atrial septal defect with QP/QS of 1.25 by MRA.   Cancer [facility])    melanoma left leg 7425   Complication of anesthesia    gets migraine when she has to be NPO   Depression    Hyperlipidemia    Hypertension    Pt doesn't take meds and denies having BP issues however her diostolic in alway in high 80s -90s   Hypothyroidism    Migraine    occasionally   Migraine headache    Obesity    Obstructive apnea    CPAP user since 2012.    PONV (postoperative nausea and vomiting)    n/v from migraine, not anesthesia   Primary localized osteoarthritis of left knee 08/02/2018   osteo   Pseudotumor cerebri Diagnosed in March 2008-   She sees Dr Clovis Riley in this regard   Sleep apnea    uses CPAP   Statin intolerance    with elevated CRP    Past Surgical History:  Procedure Laterality Date   BACK SURGERY     BIOPSY  07/04/2020   Procedure:  BIOPSY;  Surgeon: Thornton Park, MD;  Location: [facility];  Service: Gastroenterology;;   BUNIONECTOMY     COLONOSCOPY WITH PROPOFOL N/A 07/04/2020   Procedure: COLONOSCOPY WITH PROPOFOL;  Surgeon: Thornton Park, MD;  Location: [facility];  Service: Gastroenterology;  Laterality: N/A;   FRACTURE SURGERY     left 5th metatarsal   MASS EXCISION Left 03/22/2018   Procedure: EXCISION CYST DEBRIDEMENT DISTAL INTERPHALANGEAL LEFT MIDDLE FINGER;  Surgeon: Daryll Brod, MD;  Location: [place];  Service: Orthopedics;  Laterality: Left;  FAB   ORTHOPEDIC SURGERY     lumbar surgery    PARTIAL KNEE ARTHROPLASTY Right 09/15/2016   Procedure: UNICOMPARTMENTAL KNEE;  Surgeon: Marchia Bond, MD;  Location: [facility];  Service: Orthopedics;  Laterality: Right;   PARTIAL KNEE ARTHROPLASTY Left 08/02/2018   Procedure: UNICOMPARTMENTAL KNEE;  Surgeon: Marchia Bond, MD;  Location: [facility];  Service: Orthopedics;  Laterality: Left;   pin in the fifth metatarsal     right knee arthroscopy     WISDOM TOOTH EXTRACTION      Current Facility-Administered Medications  Medication Dose Route Frequency Provider Last Rate Last Admin   0.9 %  sodium chloride infusion   Intravenous Continuous Thornton Park, MD       lactated ringers infusion   Intravenous Continuous Thornton Park, MD 10 mL/hr at 11/10/21 0710 New Bag at 11/10/21 0710  Allergies as of 08/26/2021 - Review Complete 08/26/2021  Allergen Reaction Noted   Augmentin [amoxicillin-pot clavulanate] Nausea And Vomiting and Other (See Comments) 09/01/2016    Family History  Problem Relation Age of Onset   Alzheimer's disease Mother    Rheumatic fever Father    Colon cancer Neg Hx    Esophageal cancer Neg Hx    Stomach cancer Neg Hx    Colon polyps Neg Hx    Rectal cancer Neg Hx      Physical Exam: General:   Alert,  well-nourished, pleasant and cooperative in NAD Head:  Normocephalic and atraumatic. Eyes:  Sclera  clear, no icterus.   Conjunctiva pink. Mouth:  No deformity or lesions.   Neck:  Supple; no masses or thyromegaly. Lungs:  Clear throughout to auscultation.   No wheezes. Heart:  Regular rate and rhythm; no murmurs. Abdomen:  Soft, non-tender, nondistended, normal bowel sounds, no rebound or guarding.  Msk:  Symmetrical. No boney deformities LAD: No inguinal or umbilical LAD Extremities:  No clubbing or edema. Neurologic:  Alert and  oriented x4;  grossly nonfocal Skin:  No obvious rash or bruise. Psych:  Alert and cooperative. Normal mood and affect.     Studies/Results: No results found.    Diana [name] L. Tarri Glenn, MD, MPH 11/10/2021, 7:35 AM

## 2021-11-10 NOTE — Transfer of Care (Signed)
Immediate Anesthesia Transfer of Care Note  Patient: Diana [name]  Procedure(s) Performed: COLONOSCOPY WITH PROPOFOL POLYPECTOMY  Patient Location: PACU and Endoscopy Unit  Anesthesia Type:MAC  Level of Consciousness: awake, alert , oriented and patient cooperative  Airway & Oxygen Therapy: Patient Spontanous Breathing  Post-op Assessment: Report given to RN and Post -op Vital signs reviewed and stable  Post vital signs: Reviewed and stable  Last Vitals:  Vitals Value Taken Time  BP 115/39 11/10/21 0821  Temp    Pulse 79 11/10/21 0822  Resp 16 11/10/21 0822  SpO2 99 % 11/10/21 0822  Vitals shown include unvalidated device data.  Last Pain:  Vitals:   11/10/21 0651  TempSrc: Oral  PainSc: 0-No pain         Complications: No notable events documented.

## 2021-11-10 NOTE — Anesthesia Preprocedure Evaluation (Signed)
Anesthesia Evaluation  Patient identified by MRN, date of birth, ID band Patient awake    Reviewed: Allergy & Precautions, NPO status , Patient's Chart, lab work & pertinent test results  History of Anesthesia Complications (+) PONV and history of anesthetic complications  Airway Mallampati: II  TM Distance: >3 FB Neck ROM: Full    Dental no notable dental hx. (+) Teeth Intact, Dental Advisory Given   Pulmonary sleep apnea ,    Pulmonary exam normal breath sounds clear to auscultation       Cardiovascular hypertension, Pt. on medications Normal cardiovascular exam Rhythm:Regular Rate:Normal  10/20 echo  Left ventricular ejection fraction, by visual estimation, is 55 to 60%. The left ventricle has normal function. There is borderline left ventricular hypertrophy. 2. Left ventricular diastolic parameters are consistent with Grade I diastolic dysfunction (impaired relaxation). 3. Global right ventricle has normal systolic function.The right ventricular size is normal. No increase in right ventricular wall thickness.   Neuro/Psych  Headaches, Anxiety Depression    GI/Hepatic Neg liver ROS, PUD,   Endo/Other  Hypothyroidism Morbid obesity  Renal/GU      Musculoskeletal  (+) Arthritis , Osteoarthritis,    Abdominal (+) + obese,   Peds  Hematology   Anesthesia Other Findings   Reproductive/Obstetrics                             Anesthesia Physical  Anesthesia Plan  ASA: 3  Anesthesia Plan: MAC   Post-op Pain Management: Minimal or no pain anticipated   Induction: Intravenous  PONV Risk Score and Plan: 3 and Treatment may vary due to age or medical condition, Ondansetron, Dexamethasone and Midazolam  Airway Management Planned: Natural Airway and Simple Face Mask  Additional Equipment: None  Intra-op Plan:   Post-operative Plan:   Informed Consent: I have reviewed the patients  History and Physical, chart, labs and discussed the procedure including the risks, benefits and alternatives for the proposed anesthesia with the patient or authorized representative who has indicated his/her understanding and acceptance.     Dental advisory given  Plan Discussed with: CRNA  Anesthesia Plan Comments:         Anesthesia Quick Evaluation

## 2021-11-11 LAB — SURGICAL PATHOLOGY

## 2021-11-12 ENCOUNTER — Encounter (HOSPITAL_COMMUNITY): Payer: Self-pay | Admitting: Gastroenterology

## 2021-11-18 ENCOUNTER — Encounter: Payer: Self-pay | Admitting: Gastroenterology

## 2021-12-15 ENCOUNTER — Other Ambulatory Visit: Payer: Self-pay | Admitting: *Deleted

## 2021-12-15 ENCOUNTER — Encounter: Payer: Self-pay | Admitting: Family Medicine

## 2021-12-15 DIAGNOSIS — G4733 Obstructive sleep apnea (adult) (pediatric): Secondary | ICD-10-CM

## 2021-12-25 ENCOUNTER — Other Ambulatory Visit: Payer: Self-pay | Admitting: Internal Medicine

## 2021-12-29 ENCOUNTER — Ambulatory Visit
Admission: RE | Admit: 2021-12-29 | Discharge: 2021-12-29 | Disposition: A | Discharge status: Home / Self Care | Payer: PPO | Source: Ambulatory Visit | Attending: Internal Medicine | Admitting: Internal Medicine

## 2021-12-29 DIAGNOSIS — Z1231 Encounter for screening mammogram for malignant neoplasm of breast: Secondary | ICD-10-CM | POA: Diagnosis not present

## 2022-01-21 ENCOUNTER — Telehealth: Payer: PPO

## 2022-01-27 ENCOUNTER — Other Ambulatory Visit: Payer: PPO

## 2022-01-27 DIAGNOSIS — E782 Mixed hyperlipidemia: Secondary | ICD-10-CM | POA: Diagnosis not present

## 2022-01-27 DIAGNOSIS — E039 Hypothyroidism, unspecified: Secondary | ICD-10-CM | POA: Diagnosis not present

## 2022-01-27 DIAGNOSIS — R7302 Impaired glucose tolerance (oral): Secondary | ICD-10-CM

## 2022-01-28 LAB — LIPID PANEL
Cholesterol: 176 mg/dL (ref <200)
HDL: 48 mg/dL — ABNORMAL LOW (ref >=50)
LDL Cholesterol (Calc): 97 mg/dL (calc)
Non-HDL Cholesterol (Calc): 128 mg/dL (calc) (ref <130)
Total CHOL/HDL Ratio: 3.7 (calc) (ref <5.0)
Triglycerides: 213 mg/dL — ABNORMAL HIGH (ref <150)

## 2022-01-28 LAB — HEPATIC FUNCTION PANEL
AG Ratio: 1.6 (calc) (ref 1.0–2.5)
ALT: 28 U/L (ref 6–29)
AST: 23 U/L (ref 10–35)
Albumin: 4.5 g/dL (ref 3.6–5.1)
Alkaline phosphatase (APISO): 100 U/L (ref 37–153)
Bilirubin, Direct: 0.1 mg/dL (ref 0.0–0.2)
Globulin: 2.8 g/dL (calc) (ref 1.9–3.7)
Indirect Bilirubin: 0.4 mg/dL (calc) (ref 0.2–1.2)
Total Bilirubin: 0.5 mg/dL (ref 0.2–1.2)
Total Protein: 7.3 g/dL (ref 6.1–8.1)

## 2022-01-28 LAB — TSH: TSH: 3.49 mIU/L (ref 0.40–4.50)

## 2022-01-29 ENCOUNTER — Encounter: Payer: Self-pay | Admitting: Internal Medicine

## 2022-01-29 ENCOUNTER — Ambulatory Visit (INDEPENDENT_AMBULATORY_CARE_PROVIDER_SITE_OTHER): Payer: PPO | Admitting: Internal Medicine

## 2022-01-29 VITALS — BP 134/76 | HR 97 | Temp 98.1°F | Ht 68.0 in | Wt 341.0 lb

## 2022-01-29 DIAGNOSIS — E039 Hypothyroidism, unspecified: Secondary | ICD-10-CM | POA: Diagnosis not present

## 2022-01-29 DIAGNOSIS — Z8669 Personal history of other diseases of the nervous system and sense organs: Secondary | ICD-10-CM

## 2022-01-29 DIAGNOSIS — I10 Essential (primary) hypertension: Secondary | ICD-10-CM | POA: Diagnosis not present

## 2022-01-29 DIAGNOSIS — Z6841 Body Mass Index (BMI) 40.0 and over, adult: Secondary | ICD-10-CM | POA: Diagnosis not present

## 2022-01-29 DIAGNOSIS — Z8659 Personal history of other mental and behavioral disorders: Secondary | ICD-10-CM | POA: Diagnosis not present

## 2022-01-29 DIAGNOSIS — Q211 Atrial septal defect, unspecified: Secondary | ICD-10-CM

## 2022-01-29 DIAGNOSIS — E782 Mixed hyperlipidemia: Secondary | ICD-10-CM

## 2022-01-29 DIAGNOSIS — G4733 Obstructive sleep apnea (adult) (pediatric): Secondary | ICD-10-CM

## 2022-01-29 NOTE — Patient Instructions (Addendum)
Referral to surgeon to see about possible hernia in abdomen. Continue to work on diet and exercise. Triglycerides remain elevated RTC in 6 months for CPE

## 2022-01-29 NOTE — Progress Notes (Signed)
   Subjective:    Patient ID: Diana [name], female    DOB: January 28, 1955, 67 y.o.   MRN: [id number]  HPI 67 year old Female seen for 6 month follow up. Has some discomfort in left periumbilical area about 2-3 inches from umbilicus at 11 o'clock. She is worried about a hernia. Has no nausea, vomiting diarrhea, constipation.  History of ASD and was evaluated by Dr. Burt Knack with echo in March 2023 with normal ejection fraction, normal mitral valve, no aortic regurgitation or stenosis.  Both right and left atria were normal in size.  Discussion about diet exercise and weight loss. Has not been motivated recently.  She has a history of hypertension, vitamin D deficiency, obesity and hypothyroidism history of sleep apnea treated with CPAP.  Also has history of hypertriglyceridemia.  Recent labs show a low HDL of 48, normal total cholesterol of 176, triglycerides of 213, LDL cholesterol of 97.  Patient says she has not been following a low-fat diet.  She says she will do better.  Dr. Debara Pickett suggested statin medication but she has had some side effects with pravastatin and rosuvastatin.  He most recently prescribed atorvastatin 20 mg daily in April 2023 she has a small secundum atrial septal defect.  She has morbid obesity and hypothyroidism.  Coronary calcium scoring was 0.  She has a history of hypothyroidism and is on thyroid replacement medication.  History of migraine headaches.  History of mild hypertension treated with amlodipine.    Review of Systems see above regarding left periumbilical area     Objective:   Physical Exam     BP 134/76 large cuff.  Weight 341 pounds.  BMI 51.85, pulse 97 regular, pulse oximetry 98% on room air  Abdomen is soft, nondistended but obese.  No hepatosplenomegaly or masses appreciated.  There is no rebound tenderness.    Assessment & Plan:  Left periumbilical area discomfort some 2 to 3 inches from the umbilicus at [id number].  Possible hernia versus  musculoskeletal pain.  She has no evidence of acute abdomen today.  History of ASD-normal echo recently by Dr. Burt Knack in April 2023  Morbid obesity  Hyperlipidemia-does not like being on statin medication  Mild hypertension-prescribed amlodipine 5 mg daily  History of vitamin D deficiency treated with Drisdol 50,000 units weekly  Hypothyroidism treated with thyroid replacement medication  History of depression treated with Pristiq  History of migraine headaches treated with Imitrex  Plan: Suggest referral to general surgeon to see if she has a hernia.  Needs to work on diet exercise and weight loss and return in 6 months for health maintenance exam.

## 2022-02-01 ENCOUNTER — Encounter: Payer: Self-pay | Admitting: Internal Medicine

## 2022-02-04 DIAGNOSIS — F331 Major depressive disorder, recurrent, moderate: Secondary | ICD-10-CM | POA: Diagnosis not present

## 2022-02-04 DIAGNOSIS — F332 Major depressive disorder, recurrent severe without psychotic features: Secondary | ICD-10-CM | POA: Diagnosis not present

## 2022-02-10 DIAGNOSIS — F331 Major depressive disorder, recurrent, moderate: Secondary | ICD-10-CM | POA: Diagnosis not present

## 2022-02-10 DIAGNOSIS — F332 Major depressive disorder, recurrent severe without psychotic features: Secondary | ICD-10-CM | POA: Diagnosis not present

## 2022-02-17 ENCOUNTER — Ambulatory Visit (INDEPENDENT_AMBULATORY_CARE_PROVIDER_SITE_OTHER): Payer: PPO | Admitting: Internal Medicine

## 2022-02-17 ENCOUNTER — Encounter: Payer: Self-pay | Admitting: Internal Medicine

## 2022-02-17 VITALS — BP 128/68 | HR 97 | Temp 98.9°F | Ht 68.0 in | Wt 345.8 lb

## 2022-02-17 DIAGNOSIS — I10 Essential (primary) hypertension: Secondary | ICD-10-CM | POA: Diagnosis not present

## 2022-02-17 DIAGNOSIS — E782 Mixed hyperlipidemia: Secondary | ICD-10-CM

## 2022-02-17 DIAGNOSIS — Z8659 Personal history of other mental and behavioral disorders: Secondary | ICD-10-CM

## 2022-02-17 DIAGNOSIS — Z8669 Personal history of other diseases of the nervous system and sense organs: Secondary | ICD-10-CM | POA: Diagnosis not present

## 2022-02-17 DIAGNOSIS — E039 Hypothyroidism, unspecified: Secondary | ICD-10-CM | POA: Diagnosis not present

## 2022-02-17 DIAGNOSIS — Q211 Atrial septal defect, unspecified: Secondary | ICD-10-CM | POA: Diagnosis not present

## 2022-02-17 DIAGNOSIS — Z6841 Body Mass Index (BMI) 40.0 and over, adult: Secondary | ICD-10-CM | POA: Diagnosis not present

## 2022-02-17 NOTE — Progress Notes (Shared)
Patient Care Team: Elby Showers, MD as PCP - General (Internal Medicine) Sherren Mocha, MD as PCP - Cardiology (Cardiology)  Visit Date: 02/17/22  Subjective:    Patient ID: Diana [name] , Female   DOB: 27-Jul-1955, 67 y.o.    MRN: [id number]   67 y.o. Female presents today to discuss her weight. Patient has a past medical history of obesity, cancer, hyperlipidemia, hypertension, hypothyroidism, obesity, anxiety and depression, osteoarthritis of left knee.  History of obesity. She is requesting a referral for [facility]. She is uncertain about using SGLT medications for weight loss and diabetes treatment.  History of depression treated with Pristiq 100 mg daily. Reports that she is planning to start [place] therapy soon.  History of hyperlipidemia treated with Lipitor 20 mg daily at bedtime. TRIG elevated at 213 on 01/27/22.  History of hypertension treated with Norvasc 5 mg daily.  History of hypothyroidism treated with Synthroid 75 mcg daily.  History of migraines treated with Imitrex 100 mg as needed.   Past Medical History:  Diagnosis Date   Anxiety    controlled   Atrial septal defect    Small,non significant atrial septal defect with QP/QS of 1.25 by MRA.   Cancer [facility])    melanoma left leg 3785   Complication of anesthesia    gets migraine when she has to be NPO   Depression    Hyperlipidemia    Hypertension    Pt doesn't take meds and denies having BP issues however her diostolic in alway in high 80s -90s   Hypothyroidism    Migraine    occasionally   Migraine headache    Obesity    Obstructive apnea    CPAP user since 2012.    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 She was referred for evaluation of what was previously been diagnosed as an ASD in 2001 at [facility]. She was advised to have it followed medically. She is marrried and lives with her spouse . She may have two glasses  of wine a week. She does not use tobacco. She sleeps with a CPAP.   Caffeine Use: 1-2 cups daily      Review of Systems  Constitutional:  Negative for fever and malaise/fatigue.  HENT:  Negative for congestion.   Eyes:  Negative for blurred vision.  Respiratory:  Negative for cough and shortness of breath.   Cardiovascular:  Negative for chest pain, palpitations and leg swelling.  Gastrointestinal:  Negative for vomiting.  Musculoskeletal:  Negative for back pain.  Skin:  Negative for rash.  Neurological:  Negative for loss of consciousness and headaches.  Objective:   Vitals: BP 128/68   Pulse 97   Temp 98.9 F (37.2 C) (Tympanic)   Ht '5\' 8"'$  (1.727 m)   Wt (!) 345 lb 12.8 oz (156.9 kg)   SpO2 97%   BMI 52.58 kg/m    She was seen for discussion of weight loss management. She has begun to have some SOB with minimal exertion. Planning a trip to [place] in the near future  Should also consider Cardiology evaluation before starting exercise regimen. Had coronary calcium score of 0 in 2020.   Results:   Studies obtained and personally reviewed by me:   Labs:       Component Value Date/Time   NA 141 07/28/2021 0946   K 4.3 07/28/2021 0946   CL 103 07/28/2021 0946   CO2 28 07/28/2021 0946   GLUCOSE 100 (H) 07/28/2021 0946   BUN 19 07/28/2021 0946   CREATININE 0.91 07/28/2021 0946   CALCIUM 8.8  07/28/2021 0946   PROT 7.3 01/27/2022 0942   ALBUMIN 4.2 02/13/2016 1115   AST 23 01/27/2022 0942   ALT 28 01/27/2022 0942   ALKPHOS 73 02/13/2016 1115   BILITOT 0.5 01/27/2022 0942   GFRNONAA 64 11/30/2019 0927   GFRAA 74 11/30/2019 0927     Lab Results  Component Value Date   WBC 5.8 07/28/2021   HGB 13.5 07/28/2021   HCT 40.3 07/28/2021   MCV 89.0 07/28/2021   PLT 210 07/28/2021    Lab Results  Component Value Date   CHOL 176 01/27/2022   HDL 48 (L) 01/27/2022   LDLCALC 97 01/27/2022   TRIG 213 (H) 01/27/2022   CHOLHDL 3.7 01/27/2022    Lab Results  Component Value Date   HGBA1C 5.5 12/08/2019     Lab Results  Component Value Date   TSH 3.49 01/27/2022      Assessment & Plan:   Obesity: Referred to [facility]. Pt given information sheet with phone number. Discussed SGLT medications for weight loss and diabetes treatment.  Depression: Treating with Pristiq 100 mg daily. Reports that she is planning to start [place] therapy soon.  Hyperlipidemia: Well-controlled with Lipitor 20 mg daily at bedtime. TRIG elevated at 213 on 01/27/22.  Hypertension: Well-controlled with Norvasc 5 mg daily.  Hypothyroidism: Well-controlled with Synthroid 75 mcg daily.  Migraines: Well-controlled with Imitrex 100 mg as needed.   I,Alexander Ruley,acting as a Education administrator for Elby Showers, MD.,have documented all relevant documentation on the behalf of Elby Showers, MD,as directed by  Elby Showers, MD while in the presence of Elby Showers, MD.   I, Elby Showers, MD, have reviewed all documentation for this visit. The documentation on 02/18/22 for the exam, diagnosis, procedures, and orders are all accurate and complete.

## 2022-02-18 ENCOUNTER — Encounter: Payer: Self-pay | Admitting: Internal Medicine

## 2022-02-18 DIAGNOSIS — F331 Major depressive disorder, recurrent, moderate: Secondary | ICD-10-CM | POA: Diagnosis not present

## 2022-02-18 DIAGNOSIS — F332 Major depressive disorder, recurrent severe without psychotic features: Secondary | ICD-10-CM | POA: Diagnosis not present

## 2022-02-18 NOTE — Patient Instructions (Signed)
Patient is interesyed in [place] or similar drugs for weight loss management. Given info on [facility] and she will call for appt

## 2022-02-24 ENCOUNTER — Encounter: Payer: PPO | Admitting: Bariatrics

## 2022-03-03 DIAGNOSIS — R635 Abnormal weight gain: Secondary | ICD-10-CM | POA: Diagnosis not present

## 2022-03-03 DIAGNOSIS — E782 Mixed hyperlipidemia: Secondary | ICD-10-CM | POA: Diagnosis not present

## 2022-03-03 DIAGNOSIS — G4733 Obstructive sleep apnea (adult) (pediatric): Secondary | ICD-10-CM | POA: Diagnosis not present

## 2022-03-03 DIAGNOSIS — Z6841 Body Mass Index (BMI) 40.0 and over, adult: Secondary | ICD-10-CM | POA: Diagnosis not present

## 2022-03-03 DIAGNOSIS — Z131 Encounter for screening for diabetes mellitus: Secondary | ICD-10-CM | POA: Diagnosis not present

## 2022-03-03 DIAGNOSIS — E559 Vitamin D deficiency, unspecified: Secondary | ICD-10-CM | POA: Diagnosis not present

## 2022-03-03 DIAGNOSIS — I10 Essential (primary) hypertension: Secondary | ICD-10-CM | POA: Diagnosis not present

## 2022-03-03 DIAGNOSIS — F329 Major depressive disorder, single episode, unspecified: Secondary | ICD-10-CM | POA: Diagnosis not present

## 2022-03-06 ENCOUNTER — Encounter: Payer: Self-pay | Admitting: Gastroenterology

## 2022-03-06 DIAGNOSIS — F331 Major depressive disorder, recurrent, moderate: Secondary | ICD-10-CM | POA: Diagnosis not present

## 2022-03-18 DIAGNOSIS — F332 Major depressive disorder, recurrent severe without psychotic features: Secondary | ICD-10-CM | POA: Diagnosis not present

## 2022-03-19 DIAGNOSIS — I10 Essential (primary) hypertension: Secondary | ICD-10-CM | POA: Diagnosis not present

## 2022-03-19 DIAGNOSIS — E559 Vitamin D deficiency, unspecified: Secondary | ICD-10-CM | POA: Diagnosis not present

## 2022-03-19 DIAGNOSIS — Z6841 Body Mass Index (BMI) 40.0 and over, adult: Secondary | ICD-10-CM | POA: Diagnosis not present

## 2022-03-19 DIAGNOSIS — R635 Abnormal weight gain: Secondary | ICD-10-CM | POA: Diagnosis not present

## 2022-03-19 DIAGNOSIS — F329 Major depressive disorder, single episode, unspecified: Secondary | ICD-10-CM | POA: Diagnosis not present

## 2022-03-19 DIAGNOSIS — G4733 Obstructive sleep apnea (adult) (pediatric): Secondary | ICD-10-CM | POA: Diagnosis not present

## 2022-03-19 DIAGNOSIS — E782 Mixed hyperlipidemia: Secondary | ICD-10-CM | POA: Diagnosis not present

## 2022-03-19 DIAGNOSIS — F332 Major depressive disorder, recurrent severe without psychotic features: Secondary | ICD-10-CM | POA: Diagnosis not present

## 2022-03-20 DIAGNOSIS — F332 Major depressive disorder, recurrent severe without psychotic features: Secondary | ICD-10-CM | POA: Diagnosis not present

## 2022-03-23 DIAGNOSIS — F332 Major depressive disorder, recurrent severe without psychotic features: Secondary | ICD-10-CM | POA: Diagnosis not present

## 2022-03-25 ENCOUNTER — Other Ambulatory Visit: Payer: Self-pay | Admitting: Internal Medicine

## 2022-03-25 ENCOUNTER — Encounter: Payer: PPO | Admitting: Bariatrics

## 2022-03-25 DIAGNOSIS — F332 Major depressive disorder, recurrent severe without psychotic features: Secondary | ICD-10-CM | POA: Diagnosis not present

## 2022-03-26 DIAGNOSIS — F332 Major depressive disorder, recurrent severe without psychotic features: Secondary | ICD-10-CM | POA: Diagnosis not present

## 2022-03-27 DIAGNOSIS — F332 Major depressive disorder, recurrent severe without psychotic features: Secondary | ICD-10-CM | POA: Diagnosis not present

## 2022-03-30 DIAGNOSIS — F332 Major depressive disorder, recurrent severe without psychotic features: Secondary | ICD-10-CM | POA: Diagnosis not present

## 2022-03-31 DIAGNOSIS — F332 Major depressive disorder, recurrent severe without psychotic features: Secondary | ICD-10-CM | POA: Diagnosis not present

## 2022-04-01 DIAGNOSIS — F332 Major depressive disorder, recurrent severe without psychotic features: Secondary | ICD-10-CM | POA: Diagnosis not present

## 2022-04-02 DIAGNOSIS — G4733 Obstructive sleep apnea (adult) (pediatric): Secondary | ICD-10-CM | POA: Diagnosis not present

## 2022-04-02 DIAGNOSIS — F332 Major depressive disorder, recurrent severe without psychotic features: Secondary | ICD-10-CM | POA: Diagnosis not present

## 2022-04-02 DIAGNOSIS — F331 Major depressive disorder, recurrent, moderate: Secondary | ICD-10-CM | POA: Diagnosis not present

## 2022-04-02 DIAGNOSIS — I10 Essential (primary) hypertension: Secondary | ICD-10-CM | POA: Diagnosis not present

## 2022-04-02 DIAGNOSIS — Z6841 Body Mass Index (BMI) 40.0 and over, adult: Secondary | ICD-10-CM | POA: Diagnosis not present

## 2022-04-03 DIAGNOSIS — F332 Major depressive disorder, recurrent severe without psychotic features: Secondary | ICD-10-CM | POA: Diagnosis not present

## 2022-04-06 DIAGNOSIS — F332 Major depressive disorder, recurrent severe without psychotic features: Secondary | ICD-10-CM | POA: Diagnosis not present

## 2022-04-07 DIAGNOSIS — H60502 Unspecified acute noninfective otitis externa, left ear: Secondary | ICD-10-CM | POA: Diagnosis not present

## 2022-04-07 DIAGNOSIS — F332 Major depressive disorder, recurrent severe without psychotic features: Secondary | ICD-10-CM | POA: Diagnosis not present

## 2022-04-08 DIAGNOSIS — F332 Major depressive disorder, recurrent severe without psychotic features: Secondary | ICD-10-CM | POA: Diagnosis not present

## 2022-04-09 DIAGNOSIS — F332 Major depressive disorder, recurrent severe without psychotic features: Secondary | ICD-10-CM | POA: Diagnosis not present

## 2022-04-10 DIAGNOSIS — F332 Major depressive disorder, recurrent severe without psychotic features: Secondary | ICD-10-CM | POA: Diagnosis not present

## 2022-04-13 DIAGNOSIS — F332 Major depressive disorder, recurrent severe without psychotic features: Secondary | ICD-10-CM | POA: Diagnosis not present

## 2022-04-14 DIAGNOSIS — F332 Major depressive disorder, recurrent severe without psychotic features: Secondary | ICD-10-CM | POA: Diagnosis not present

## 2022-04-14 DIAGNOSIS — F329 Major depressive disorder, single episode, unspecified: Secondary | ICD-10-CM | POA: Diagnosis not present

## 2022-04-14 DIAGNOSIS — G4733 Obstructive sleep apnea (adult) (pediatric): Secondary | ICD-10-CM | POA: Diagnosis not present

## 2022-04-14 DIAGNOSIS — I10 Essential (primary) hypertension: Secondary | ICD-10-CM | POA: Diagnosis not present

## 2022-04-14 DIAGNOSIS — E559 Vitamin D deficiency, unspecified: Secondary | ICD-10-CM | POA: Diagnosis not present

## 2022-04-14 DIAGNOSIS — E782 Mixed hyperlipidemia: Secondary | ICD-10-CM | POA: Diagnosis not present

## 2022-04-14 DIAGNOSIS — Z6841 Body Mass Index (BMI) 40.0 and over, adult: Secondary | ICD-10-CM | POA: Diagnosis not present

## 2022-04-15 DIAGNOSIS — F332 Major depressive disorder, recurrent severe without psychotic features: Secondary | ICD-10-CM | POA: Diagnosis not present

## 2022-04-17 DIAGNOSIS — F332 Major depressive disorder, recurrent severe without psychotic features: Secondary | ICD-10-CM | POA: Diagnosis not present

## 2022-04-20 DIAGNOSIS — F332 Major depressive disorder, recurrent severe without psychotic features: Secondary | ICD-10-CM | POA: Diagnosis not present

## 2022-04-21 DIAGNOSIS — F332 Major depressive disorder, recurrent severe without psychotic features: Secondary | ICD-10-CM | POA: Diagnosis not present

## 2022-04-22 DIAGNOSIS — F332 Major depressive disorder, recurrent severe without psychotic features: Secondary | ICD-10-CM | POA: Diagnosis not present

## 2022-04-23 DIAGNOSIS — F331 Major depressive disorder, recurrent, moderate: Secondary | ICD-10-CM | POA: Diagnosis not present

## 2022-04-23 DIAGNOSIS — F332 Major depressive disorder, recurrent severe without psychotic features: Secondary | ICD-10-CM | POA: Diagnosis not present

## 2022-04-24 DIAGNOSIS — F332 Major depressive disorder, recurrent severe without psychotic features: Secondary | ICD-10-CM | POA: Diagnosis not present

## 2022-04-27 DIAGNOSIS — F332 Major depressive disorder, recurrent severe without psychotic features: Secondary | ICD-10-CM | POA: Diagnosis not present

## 2022-04-28 DIAGNOSIS — F332 Major depressive disorder, recurrent severe without psychotic features: Secondary | ICD-10-CM | POA: Diagnosis not present

## 2022-04-29 DIAGNOSIS — F332 Major depressive disorder, recurrent severe without psychotic features: Secondary | ICD-10-CM | POA: Diagnosis not present

## 2022-04-30 DIAGNOSIS — F332 Major depressive disorder, recurrent severe without psychotic features: Secondary | ICD-10-CM | POA: Diagnosis not present

## 2022-05-01 DIAGNOSIS — F332 Major depressive disorder, recurrent severe without psychotic features: Secondary | ICD-10-CM | POA: Diagnosis not present

## 2022-05-04 DIAGNOSIS — Z85828 Personal history of other malignant neoplasm of skin: Secondary | ICD-10-CM | POA: Diagnosis not present

## 2022-05-04 DIAGNOSIS — Z85820 Personal history of malignant melanoma of skin: Secondary | ICD-10-CM | POA: Diagnosis not present

## 2022-05-04 DIAGNOSIS — L720 Epidermal cyst: Secondary | ICD-10-CM | POA: Diagnosis not present

## 2022-05-04 DIAGNOSIS — F332 Major depressive disorder, recurrent severe without psychotic features: Secondary | ICD-10-CM | POA: Diagnosis not present

## 2022-05-04 DIAGNOSIS — D1801 Hemangioma of skin and subcutaneous tissue: Secondary | ICD-10-CM | POA: Diagnosis not present

## 2022-05-04 DIAGNOSIS — L821 Other seborrheic keratosis: Secondary | ICD-10-CM | POA: Diagnosis not present

## 2022-05-04 DIAGNOSIS — L820 Inflamed seborrheic keratosis: Secondary | ICD-10-CM | POA: Diagnosis not present

## 2022-05-05 DIAGNOSIS — F332 Major depressive disorder, recurrent severe without psychotic features: Secondary | ICD-10-CM | POA: Diagnosis not present

## 2022-05-21 DIAGNOSIS — F332 Major depressive disorder, recurrent severe without psychotic features: Secondary | ICD-10-CM | POA: Diagnosis not present

## 2022-05-22 DIAGNOSIS — F332 Major depressive disorder, recurrent severe without psychotic features: Secondary | ICD-10-CM | POA: Diagnosis not present

## 2022-05-25 DIAGNOSIS — F3341 Major depressive disorder, recurrent, in partial remission: Secondary | ICD-10-CM | POA: Diagnosis not present

## 2022-05-26 DIAGNOSIS — E559 Vitamin D deficiency, unspecified: Secondary | ICD-10-CM | POA: Diagnosis not present

## 2022-05-26 DIAGNOSIS — I10 Essential (primary) hypertension: Secondary | ICD-10-CM | POA: Diagnosis not present

## 2022-05-26 DIAGNOSIS — F329 Major depressive disorder, single episode, unspecified: Secondary | ICD-10-CM | POA: Diagnosis not present

## 2022-05-26 DIAGNOSIS — Z6841 Body Mass Index (BMI) 40.0 and over, adult: Secondary | ICD-10-CM | POA: Diagnosis not present

## 2022-05-26 DIAGNOSIS — G4733 Obstructive sleep apnea (adult) (pediatric): Secondary | ICD-10-CM | POA: Diagnosis not present

## 2022-05-26 DIAGNOSIS — E782 Mixed hyperlipidemia: Secondary | ICD-10-CM | POA: Diagnosis not present

## 2022-06-22 DIAGNOSIS — G4733 Obstructive sleep apnea (adult) (pediatric): Secondary | ICD-10-CM | POA: Diagnosis not present

## 2022-06-22 DIAGNOSIS — Z6841 Body Mass Index (BMI) 40.0 and over, adult: Secondary | ICD-10-CM | POA: Diagnosis not present

## 2022-06-22 DIAGNOSIS — E782 Mixed hyperlipidemia: Secondary | ICD-10-CM | POA: Diagnosis not present

## 2022-06-22 DIAGNOSIS — I10 Essential (primary) hypertension: Secondary | ICD-10-CM | POA: Diagnosis not present

## 2022-06-22 DIAGNOSIS — E559 Vitamin D deficiency, unspecified: Secondary | ICD-10-CM | POA: Diagnosis not present

## 2022-06-22 DIAGNOSIS — F329 Major depressive disorder, single episode, unspecified: Secondary | ICD-10-CM | POA: Diagnosis not present

## 2022-06-24 ENCOUNTER — Other Ambulatory Visit: Payer: Self-pay | Admitting: Internal Medicine

## 2022-06-24 DIAGNOSIS — F332 Major depressive disorder, recurrent severe without psychotic features: Secondary | ICD-10-CM | POA: Diagnosis not present

## 2022-06-29 DIAGNOSIS — F3341 Major depressive disorder, recurrent, in partial remission: Secondary | ICD-10-CM | POA: Diagnosis not present

## 2022-07-13 ENCOUNTER — Encounter: Payer: Self-pay | Admitting: Internal Medicine

## 2022-07-13 ENCOUNTER — Encounter (HOSPITAL_BASED_OUTPATIENT_CLINIC_OR_DEPARTMENT_OTHER): Payer: Self-pay

## 2022-07-13 ENCOUNTER — Emergency Department (HOSPITAL_BASED_OUTPATIENT_CLINIC_OR_DEPARTMENT_OTHER)
Admission: EM | Admit: 2022-07-13 | Discharge: 2022-07-13 | Disposition: A | Discharge status: Home / Self Care | Payer: PPO | Attending: Emergency Medicine | Admitting: Emergency Medicine

## 2022-07-13 DIAGNOSIS — I10 Essential (primary) hypertension: Secondary | ICD-10-CM | POA: Insufficient documentation

## 2022-07-13 DIAGNOSIS — R110 Nausea: Secondary | ICD-10-CM

## 2022-07-13 DIAGNOSIS — Z7982 Long term (current) use of aspirin: Secondary | ICD-10-CM | POA: Insufficient documentation

## 2022-07-13 DIAGNOSIS — Z79899 Other long term (current) drug therapy: Secondary | ICD-10-CM | POA: Insufficient documentation

## 2022-07-13 DIAGNOSIS — E860 Dehydration: Secondary | ICD-10-CM | POA: Diagnosis not present

## 2022-07-13 DIAGNOSIS — R197 Diarrhea, unspecified: Secondary | ICD-10-CM | POA: Diagnosis not present

## 2022-07-13 DIAGNOSIS — T50905A Adverse effect of unspecified drugs, medicaments and biological substances, initial encounter: Secondary | ICD-10-CM | POA: Diagnosis not present

## 2022-07-13 DIAGNOSIS — Z6841 Body Mass Index (BMI) 40.0 and over, adult: Secondary | ICD-10-CM | POA: Diagnosis not present

## 2022-07-13 DIAGNOSIS — E039 Hypothyroidism, unspecified: Secondary | ICD-10-CM | POA: Insufficient documentation

## 2022-07-13 LAB — URINALYSIS, ROUTINE W REFLEX MICROSCOPIC
Bilirubin Urine: NEGATIVE
Glucose, UA: NEGATIVE mg/dL
Hgb urine dipstick: NEGATIVE
Ketones, ur: NEGATIVE mg/dL
Nitrite: NEGATIVE
Protein, ur: NEGATIVE mg/dL
Specific Gravity, Urine: 1.01 (ref 1.005–1.030)
pH: 6 (ref 5.0–8.0)

## 2022-07-13 LAB — CBC WITH DIFFERENTIAL/PLATELET
Abs Immature Granulocytes: 0.02 10*3/uL (ref 0.00–0.07)
Basophils Absolute: 0 10*3/uL (ref 0.0–0.1)
Basophils Relative: 0 %
Eosinophils Absolute: 0.2 10*3/uL (ref 0.0–0.5)
Eosinophils Relative: 3 %
HCT: 39.7 % (ref 36.0–46.0)
Hemoglobin: 13.5 g/dL (ref 12.0–15.0)
Immature Granulocytes: 0 %
Lymphocytes Relative: 19 %
Lymphs Abs: 1.3 10*3/uL (ref 0.7–4.0)
MCH: 29.6 pg (ref 26.0–34.0)
MCHC: 34 g/dL (ref 30.0–36.0)
MCV: 87.1 fL (ref 80.0–100.0)
Monocytes Absolute: 0.4 10*3/uL (ref 0.1–1.0)
Monocytes Relative: 6 %
Neutro Abs: 5.1 10*3/uL (ref 1.7–7.7)
Neutrophils Relative %: 72 %
Platelets: 231 10*3/uL (ref 150–400)
RBC: 4.56 MIL/uL (ref 3.87–5.11)
RDW: 13 % (ref 11.5–15.5)
WBC: 7.1 10*3/uL (ref 4.0–10.5)
nRBC: 0 % (ref 0.0–0.2)

## 2022-07-13 LAB — COMPREHENSIVE METABOLIC PANEL
ALT: 18 U/L (ref 0–44)
AST: 15 U/L (ref 15–41)
Albumin: 4.6 g/dL (ref 3.5–5.0)
Alkaline Phosphatase: 81 U/L (ref 38–126)
Anion gap: 9 (ref 5–15)
BUN: 13 mg/dL (ref 8–23)
CO2: 28 mmol/L (ref 22–32)
Calcium: 9.6 mg/dL (ref 8.9–10.3)
Chloride: 104 mmol/L (ref 98–111)
Creatinine, Ser: 0.91 mg/dL (ref 0.44–1.00)
GFR, Estimated: >60 mL/min (ref >60)
Glucose, Bld: 116 mg/dL — ABNORMAL HIGH (ref 70–99)
Potassium: 3.7 mmol/L (ref 3.5–5.1)
Sodium: 141 mmol/L (ref 135–145)
Total Bilirubin: 0.6 mg/dL (ref 0.3–1.2)
Total Protein: 7.4 g/dL (ref 6.5–8.1)

## 2022-07-13 LAB — URINALYSIS, MICROSCOPIC (REFLEX)

## 2022-07-13 LAB — TROPONIN I (HIGH SENSITIVITY): Troponin I (High Sensitivity): 3 ng/L (ref <18)

## 2022-07-13 LAB — MAGNESIUM: Magnesium: 2.1 mg/dL (ref 1.7–2.4)

## 2022-07-13 LAB — LIPASE, BLOOD: Lipase: 25 U/L (ref 11–51)

## 2022-07-13 MED ORDER — PROMETHAZINE HCL 25 MG/ML IJ SOLN
INTRAMUSCULAR | Status: AC
  Filled 2022-07-13: qty 1

## 2022-07-13 MED ORDER — SODIUM CHLORIDE 0.9 % IV BOLUS
1000.0000 mL | Freq: Once | INTRAVENOUS | Status: AC
  Administered 2022-07-13: 1000 mL via INTRAVENOUS

## 2022-07-13 MED ORDER — PROMETHAZINE HCL 12.5 MG PO TABS: 12.5000 mg | ORAL_TABLET | Freq: Four times a day (QID) | ORAL | 0 refills | Status: DC | PRN

## 2022-07-13 MED ORDER — SODIUM CHLORIDE 0.9 % IV SOLN
12.5000 mg | Freq: Once | INTRAVENOUS | Status: AC
  Administered 2022-07-13: 12.5 mg via INTRAVENOUS
  Filled 2022-07-13: qty 0.5

## 2022-07-13 NOTE — ED Provider Notes (Signed)
[name] AT [facility] Provider Note   CSN: [id number] Arrival date & time: 07/13/22  1035     History  Chief Complaint  Patient presents with   Diarrhea    Diana [name] is a 67 y.o. female.  Pt is a 67 yo female with pmhx significant for ASD, statin intolerance, pseudotumor cerebri, migraines, anxiety, depression, melanoma, hypothyroidism, sleep apnea, htn, and hld.  Pt has just increased her ozempic dose.  She has nausea and diarrhea for the past 1.5 weeks.  She said the diarrhea is better with imodium.  Zofran is not helping nausea.  She can't eat and feels dehydrated.  She denies fevers.  No pain.       Home Medications Prior to Admission medications   Medication Sig Start Date End Date Taking? Authorizing Provider  promethazine (PHENERGAN) 12.5 MG tablet Take 1 tablet (12.5 mg total) by mouth every 6 (six) hours as needed for nausea or vomiting. 07/13/22  Yes Jacalyn Lefevre, MD  ALPRAZolam Prudy Feeler) 0.5 MG tablet Take 0.5 mg by mouth as needed for anxiety. 07/06/16   [provider]  amLODipine (NORVASC) 5 MG tablet TAKE 1 TABLET (5 MG TOTAL) BY MOUTH DAILY. 06/24/22   Margaree Mackintosh, MD  Aspirin 81 MG CAPS Take 81 mg by mouth daily.    [provider]  atorvastatin (LIPITOR) 20 MG tablet TAKE 1 TABLET BY MOUTH EVERYDAY AT BEDTIME 03/25/22   Tonny Bollman, MD  cetirizine (ZYRTEC) 10 MG tablet Take 10 mg by mouth daily.    [provider]  Coenzyme Q10 (COQ-10) 100 MG CAPS Take 100 mg by mouth daily.    [provider]  ergocalciferol (DRISDOL) 1.25 MG (50000 UT) capsule Take 1 capsule (50,000 Units total) by mouth once a week. 08/04/21   Margaree Mackintosh, MD  levothyroxine (SYNTHROID) 75 MCG tablet Take 1 tablet (75 mcg total) by mouth daily. 08/04/21   Margaree Mackintosh, MD  PRISTIQ 100 MG 24 hr tablet Take 100 mg by mouth daily.    [provider]  SUMAtriptan (IMITREX) 100 MG tablet May repeat in 2  hours if headache persists or recurs. Patient taking differently: as needed. May repeat in 2 hours if headache persists or recurs. 11/05/21   Margaree Mackintosh, MD      Allergies    Augmentin [amoxicillin-pot clavulanate]    Review of Systems   Review of Systems  Gastrointestinal:  Positive for diarrhea and nausea.  All other systems reviewed and are negative.   Physical Exam Updated Vital Signs BP (!) 153/99   Pulse 82   Temp 98.2 F (36.8 C) (Oral)   Resp 19   Ht 5\' 8"  (1.727 m)   Wt (!) 139.3 kg   SpO2 100%   BMI 46.68 kg/m  Physical Exam Vitals and nursing note reviewed.  Constitutional:      Appearance: Normal appearance. She is obese. She is diaphoretic.  HENT:     Head: Normocephalic and atraumatic.     Right Ear: External ear normal.     Left Ear: External ear normal.     Nose: Nose normal.     Mouth/Throat:     Mouth: Mucous membranes are dry.  Eyes:     Extraocular Movements: Extraocular movements intact.     Conjunctiva/sclera: Conjunctivae normal.     Pupils: Pupils are equal, round, and reactive to light.  Cardiovascular:     Rate and Rhythm: Normal rate and  regular rhythm.     Pulses: Normal pulses.     Heart sounds: Normal heart sounds.  Pulmonary:     Effort: Pulmonary effort is normal.     Breath sounds: Normal breath sounds.  Abdominal:     General: Abdomen is flat. Bowel sounds are normal.     Palpations: Abdomen is soft.  Musculoskeletal:        General: Normal range of motion.     Cervical back: Normal range of motion and neck supple.  Skin:    Capillary Refill: Capillary refill takes less than 2 seconds.  Neurological:     General: No focal deficit present.     Mental Status: She is alert and oriented to person, place, and time.  Psychiatric:        Mood and Affect: Mood normal.        Behavior: Behavior normal.     ED Results / Procedures / Treatments   Labs (all labs ordered are listed, but only abnormal results are  displayed) Labs Reviewed  COMPREHENSIVE METABOLIC PANEL - Abnormal; Notable for the following components:      Result Value   Glucose, Bld 116 (*)    All other components within normal limits  URINALYSIS, ROUTINE W REFLEX MICROSCOPIC - Abnormal; Notable for the following components:   Leukocytes,Ua SMALL (*)    All other components within normal limits  URINALYSIS, MICROSCOPIC (REFLEX) - Abnormal; Notable for the following components:   Bacteria, UA RARE (*)    All other components within normal limits  CBC WITH DIFFERENTIAL/PLATELET  LIPASE, BLOOD  MAGNESIUM  TROPONIN I (HIGH SENSITIVITY)  TROPONIN I (HIGH SENSITIVITY)    EKG EKG Interpretation  Date/Time:  Monday July 13 2022 11:30:13 EDT Ventricular Rate:  79 PR Interval:  162 QRS Duration: 101 QT Interval:  385 QTC Calculation: 442 R Axis:   -12 Text Interpretation: Age not entered, assumed to be  67 years old for purpose of ECG interpretation Sinus rhythm Abnormal R-wave progression, early transition No significant change since last tracing Confirmed by Jacalyn Lefevre [phone]) on 07/13/2022 12:38:38 PM  Radiology No results found.  Procedures Procedures    Medications Ordered in ED Medications  promethazine (PHENERGAN) 25 MG/ML injection (  Not Given 07/13/22 1222)  promethazine (PHENERGAN) 25 MG/ML injection (  Not Given 07/13/22 1223)  sodium chloride 0.9 % bolus 1,000 mL (0 mLs Intravenous Stopped 07/13/22 1226)  promethazine (PHENERGAN) 12.5 mg in sodium chloride 0.9 % 50 mL IVPB (0 mg Intravenous Stopped 07/13/22 1226)    ED Course/ Medical Decision Making/ A&P                             Medical Decision Making Amount and/or Complexity of Data Reviewed Labs: ordered.  Risk Prescription drug management.   This patient presents to the ED for concern of nausea/diarrhea, this involves an extensive number of treatment options, and is a complaint that carries with it a high risk of complications and  morbidity.  The differential diagnosis includes gastroenteritis, med rxn, infection   Co morbidities that complicate the patient evaluation  ASD, statin intolerance, pseudotumor cerebri, migraines, anxiety, depression, melanoma, hypothyroidism, sleep apnea, htn, and hld.   Additional history obtained:  Additional history obtained from epic chart review External records from outside source obtained and reviewed including family   Lab Tests:  I Ordered, and personally interpreted labs.  The pertinent results include:  cbc nl, cmp  nl, lip nl, ua nl   Cardiac Monitoring:  The patient was maintained on a cardiac monitor.  I personally viewed and interpreted the cardiac monitored which showed an underlying rhythm of: nsr   Medicines ordered and prescription drug management:  I ordered medication including ivfs/phenergan  for sx  Reevaluation of the patient after these medicines showed that the patient improved I have reviewed the patients home medicines and have made adjustments as needed   Test Considered:  ct   Problem List / ED Course:  Nausea and dehydration:  pt is feeling much better.  She is able to ambulate without problems.  We discussed CT, but using shared decision making, I think we do not have to do one as she is afebrile with normal labs and feels better.  Pt is stable for d/c.  Return if worse. F/u with pcp.   Reevaluation:  After the interventions noted above, I reevaluated the patient and found that they have :improved   Social Determinants of Health:  Lives at home   Dispostion:  After consideration of the diagnostic results and the patients response to treatment, I feel that the patent would benefit from discharge with outpatient f/u.          Final Clinical Impression(s) / ED Diagnoses Final diagnoses:  Dehydration  Nausea  Adverse effect of drug, initial encounter    Rx / DC Orders ED Discharge Orders          Ordered     promethazine (PHENERGAN) 12.5 MG tablet  Every 6 hours PRN        07/13/22 1352              Jacalyn Lefevre, MD 07/13/22 1354

## 2022-07-13 NOTE — ED Notes (Signed)
urine

## 2022-07-13 NOTE — ED Triage Notes (Signed)
C/o diarrhea & nausea x 1.5 weeks. Becoming increasingly weak. Not tolerating much PO without nausea. Taking zofran without relief. Denies abdominal pain.  Taking immodium with relief of diarrhea.

## 2022-07-13 NOTE — ED Notes (Signed)
States just increase in Ozympic in the past  week , nausea, vomiting

## 2022-07-13 NOTE — ED Notes (Addendum)
Up to BR to get urine, did well

## 2022-07-14 ENCOUNTER — Ambulatory Visit (INDEPENDENT_AMBULATORY_CARE_PROVIDER_SITE_OTHER): Payer: PPO | Admitting: Internal Medicine

## 2022-07-14 VITALS — BP 128/84 | HR 82 | Resp 16 | Ht 68.0 in | Wt 316.0 lb

## 2022-07-14 DIAGNOSIS — G4733 Obstructive sleep apnea (adult) (pediatric): Secondary | ICD-10-CM

## 2022-07-14 DIAGNOSIS — Z8669 Personal history of other diseases of the nervous system and sense organs: Secondary | ICD-10-CM | POA: Diagnosis not present

## 2022-07-14 DIAGNOSIS — E869 Volume depletion, unspecified: Secondary | ICD-10-CM | POA: Diagnosis not present

## 2022-07-14 DIAGNOSIS — E782 Mixed hyperlipidemia: Secondary | ICD-10-CM

## 2022-07-14 DIAGNOSIS — I10 Essential (primary) hypertension: Secondary | ICD-10-CM | POA: Diagnosis not present

## 2022-07-14 DIAGNOSIS — Z6841 Body Mass Index (BMI) 40.0 and over, adult: Secondary | ICD-10-CM | POA: Diagnosis not present

## 2022-07-14 DIAGNOSIS — E039 Hypothyroidism, unspecified: Secondary | ICD-10-CM | POA: Diagnosis not present

## 2022-07-14 NOTE — Progress Notes (Signed)
Patient Care Team: Margaree Mackintosh, MD as PCP - General (Internal Medicine) Tonny Bollman, MD as PCP - Cardiology (Cardiology)  Visit Date: 07/14/22  Subjective:    Patient ID: Diana [name] , Female   DOB: 11/05/1955, 68 y.o.    MRN: [id number]   67 y.o. Female presents today for a hospital/ED follow-up. Seen at [facility] for weight loss. Recently increased Ozempic dose and subsequently developed nausea and diarrhea for 1.5 weeks. Seen in ED 07/13/22 for nausea, vomiting. Given IV fluids, promethazine. Symptoms have resolved.  Past Medical History:  Diagnosis Date   Anxiety    controlled   Atrial septal defect    Small,non significant atrial septal defect with QP/QS of 1.25 by MRA.   Cancer [facility])    melanoma left leg 2012   Complication of anesthesia    gets migraine when she has to be NPO   Depression    Hyperlipidemia    Hypertension    Pt doesn't take meds and denies having BP issues however her diostolic in alway in high 80s -90s   Hypothyroidism    Migraine    occasionally   Migraine headache    Obesity    Obstructive apnea    CPAP user since 2012.    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 She was referred for evaluation of what was previously been diagnosed as an ASD in 2001 at [facility]. She was advised to have it followed medically. She is  marrried and lives with her spouse . She may have two glasses  of wine a week. She does not use tobacco. She sleeps with a CPAP.   Caffeine Use: 1-2 cups daily      Review of Systems  Constitutional:  Negative for fever and malaise/fatigue.  HENT:  Negative for congestion.   Eyes:  Negative for blurred vision.  Respiratory:  Negative for cough and shortness of breath.   Cardiovascular:  Negative for chest pain, palpitations and leg swelling.  Gastrointestinal:  Negative for vomiting.  Musculoskeletal:  Negative for back pain.  Skin:  Negative for rash.  Neurological:  Negative for loss of consciousness and headaches.        Objective:   Vitals: BP 128/84   Pulse 82   Resp 16   Ht 5\' 8"  (1.727 m)   Wt (!) 316 lb (143.3 kg)   SpO2 98%   BMI 48.05 kg/m    Physical Exam Vitals and nursing note reviewed.  Constitutional:      General: She is not in acute distress.    Appearance: Normal appearance. She is not toxic-appearing.  HENT:     Head: Normocephalic and atraumatic.  Pulmonary:     Effort: Pulmonary effort is normal.  Skin:    General: Skin is warm and dry.  Neurological:     Mental Status: She is alert and oriented to person, place, and time. Mental status is at baseline.  Psychiatric:        Mood and Affect: Mood normal.        Behavior: Behavior normal.        Thought Content: Thought content normal.        Judgment: Judgment normal.       Results:   Studies obtained and personally reviewed by me:   Labs:       Component Value Date/Time   NA 141 07/13/2022 1118   K 3.7 07/13/2022 1118   CL 104 07/13/2022 1118   CO2 28 07/13/2022 1118   GLUCOSE 116 (H) 07/13/2022 1118   BUN 13 07/13/2022 1118   CREATININE 0.91 07/13/2022 1118   CREATININE 0.91 07/28/2021 0946   CALCIUM 9.6 07/13/2022 1118   PROT 7.4 07/13/2022 1118   ALBUMIN 4.6 07/13/2022 1118   AST 15 07/13/2022 1118   ALT 18 07/13/2022 1118   ALKPHOS 81 07/13/2022 1118   BILITOT 0.6  07/13/2022 1118   GFRNONAA >60 07/13/2022 1118   GFRNONAA 64 11/30/2019 0927   GFRAA 74 11/30/2019 0927     Lab Results  Component Value Date   WBC 7.1 07/13/2022   HGB 13.5 07/13/2022   HCT 39.7 07/13/2022   MCV 87.1 07/13/2022   PLT 231 07/13/2022    Lab Results  Component Value Date   CHOL 176 01/27/2022   HDL 48 (L) 01/27/2022   LDLCALC 97 01/27/2022   TRIG 213 (H) 01/27/2022   CHOLHDL 3.7 01/27/2022    Lab Results  Component Value Date   HGBA1C 5.5 12/08/2019     Lab Results  Component Value Date   TSH 3.49 01/27/2022      Assessment & Plan:   BMI at 48.05: suggested that she continue Ozempic but take care to make incremental doses smaller. Continue care with [employer].    I,Alexander Ruley,acting as a Neurosurgeon for Margaree Mackintosh, MD.,have documented all relevant documentation on the behalf of Margaree Mackintosh, MD,as directed by  Margaree Mackintosh, MD while in the presence of Margaree Mackintosh, MD.   I, Margaree Mackintosh, MD, have reviewed all documentation for this visit. The documentation on 07/15/22 for the exam, diagnosis, procedures, and orders are all accurate and complete.

## 2022-07-14 NOTE — Telephone Encounter (Signed)
Appt today at 3:30.

## 2022-07-15 NOTE — Patient Instructions (Addendum)
Patient had volume depletion due to nausea vomiting and diarrhea with increased dosage of Ozempic.  She is now feeling well.  Labs reviewed from emergency department visit where she received fluids and antinausea medication.  I do think she benefits from Ozempic and should continue with that for morbid obesity.

## 2022-07-25 ENCOUNTER — Other Ambulatory Visit: Payer: Self-pay | Admitting: Internal Medicine

## 2022-07-27 DIAGNOSIS — Z6841 Body Mass Index (BMI) 40.0 and over, adult: Secondary | ICD-10-CM | POA: Diagnosis not present

## 2022-07-27 DIAGNOSIS — K5909 Other constipation: Secondary | ICD-10-CM | POA: Diagnosis not present

## 2022-07-27 DIAGNOSIS — I10 Essential (primary) hypertension: Secondary | ICD-10-CM | POA: Diagnosis not present

## 2022-07-30 NOTE — Progress Notes (Shared)
Annual Wellness Visit    Patient Care Team: [name], Diana Cole, MD as PCP - General (Internal Medicine) Diana Bollman, MD as PCP - Cardiology (Cardiology)  Visit Date: 07/30/22   No chief complaint on file.   Subjective:   Patient: Diana [name], Female    DOB: 1956/01/02, 67 y.o.   MRN: [id number]  Diana [name] is a 67 y.o. Female who presents today for her Annual Wellness Visit.  History of anxiety and depression treated with alprazolam 0.5 mg as needed.  History of hyperlipidemia treated with atorvastatin 20 mg at bedtime.  History of hypertension treated with amlodipine 5 mg daily.  History of hypothyroidism treated with levothyroxine 75 mcg daily.  History of migraine headaches treated with sumatriptan 100 mg as needed.  She has a history of sleep apnea treated with CPAP and followed by Neurology.   Had COVID-19 in January 2021 treated with monoclonal antibody and subsequently developed refractory cough.  She saw Dr. Sherene [name], pulmonologist and he felt she had upper airway cough syndrome consistent with postviral syndrome.  Was treated with PPI, prednisone and cough suppressant.   Underwent left unicompartmental partial knee arthroplasty by Dr. Dion [name] in July 2020.  History of partial right knee arthroplasty in 2018.   Mammogram and bone density are up to date. Discussed vaccines.   Coronary calcium score was 0   in July 2020.  Followed by Dr. Excell [name] for small secundum ASD and is asymptomatic.  History of mixed hyperlipidemia and prefers not to be on statin medication.   History of vitamin D deficiency in 2021 level was 19 and she was placed on high-dose vitamin D weekly.   Intolerant of Crestor and pravastatin.  Currently on atorvastatin 20 mg daily per Dr. Rennis [name].   History of migraine headaches treated with Imitrex.  History of allergic rhinitis treated with Zyrtec.   History of anxiety depression managed with Pristiq and Xanax.   Longstanding history  of hypothyroidism treated with thyroid replacement medication.   Remote history of impaired glucose tolerance.   No known drug allergies.   History of possible pseudotumor cerebri in 2008.  She saw Dr. Sandria [name] and was thought to have papilledema versus pseudopapilledema with significant elevated opening pressures on lumbar puncture.   Hospitalized in 2007 with volume depletion due to a viral syndrome and SSRI discontinuation syndrome.  She had upper endoscopy by Dr. Barnet [name] which was normal.   In March 2020 Dr. Merlyn [name] removed a mass from her left middle finger right hand consistent with mucoid cyst.   History of basal cell carcinoma on her back in 2012.   History of malignant melanoma left thigh in 2012 treated at [facility].  Had lymph node sampling.  Lesion was Clark level IV.  She is followed by dermatology.  Mammogram last completed 12/30/21. No mammographic evidence of malignancy. Recommended repeat in 2024.  Colonoscopy last completed 11/10/21. Showed one 2 mm polyp in cecum, ulcers seen in 2022 no longer present, tortuous and redundant colon. Examination otherwise normal. Pathology showed tubular adenoma.  T-score -0.3 at left femur on 06/27/21.  Social history: Married.  Does not smoke.  Seldom drinks alcohol.  Never been pregnant.  Tried fertility treatments without success.  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   Family history: Mother with history of migraine headaches.  Mother died at age 79 with a colonic infarction but had history of Alzheimer's disease.  Father died at age 31 with  history of stroke and congestive heart failure.  1 brother in good health.   Vaccine counseling: UTD on tetanus, flu vaccines.  Past Medical History:  Diagnosis Date   Anxiety    controlled   Atrial septal defect    Small,non significant atrial septal defect with QP/QS of 1.25 by MRA.   Cancer [facility])    melanoma left leg 2012   Complication of anesthesia     gets migraine when she has to be NPO   Depression    Hyperlipidemia    Hypertension    Pt doesn't take meds and denies having BP issues however her diostolic in alway in high 80s -90s   Hypothyroidism    Migraine    occasionally   Migraine headache    Obesity    Obstructive apnea    CPAP user since 2012.    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 She was referred for evaluation of what was previously been diagnosed as an ASD in 2001 at [facility]. She was advised to have it followed medically. She is marrried and lives with her spouse . She may have two glasses  of wine a week. She does not use tobacco. She sleeps with a CPAP.   Caffeine Use: 1-2 cups daily     ROS    Objective:   Vitals: There were no vitals taken for this visit.  Physical Exam   Most recent functional status assessment:    08/04/2021   10:57 AM  In your present state of health, do you have any difficulty performing the following activities:  Hearing? 0  Vision? 0  Difficulty concentrating or making decisions? 0  Walking or climbing stairs? 0  Dressing or bathing? 0  Doing errands, shopping? 0  Preparing Food and eating ? N  Using the Toilet? N  In the past six months, have you accidently leaked urine? N  Do you have problems with loss of bowel control? N  Managing your Medications? N   Managing your Finances? N  Housekeeping or managing your Housekeeping? N   Most recent fall risk assessment:    02/17/2022    3:28 PM  Fall Risk   Falls in the past year? 0  Number falls in past yr: 0  Injury with Fall? 0  Risk for fall due to : No Fall Risks  Follow up Falls prevention discussed    Most recent depression screenings:    02/17/2022    3:28 PM 01/29/2022    9:28 AM  PHQ 2/9 Scores  PHQ - 2 Score 0 0   Most recent cognitive screening:    08/04/2021   10:58 AM  6CIT Screen  What Year? 0 points  What month? 0 points  What time? 0 points  Count back from 20 0 points  Months in reverse 0 points  Repeat phrase 0 points  Total Score 0 points     Results:   Studies obtained and personally reviewed by me:  Imaging, colonoscopy, mammogram, bone density scan, echocardiogram, heart cath, stress test, CT calcium score, etc. ***   Labs:       Component Value Date/Time   NA 141 07/13/2022 1118   K 3.7 07/13/2022 1118   CL 104 07/13/2022 1118   CO2 28 07/13/2022 1118   GLUCOSE 116 (H) 07/13/2022 1118   BUN 13 07/13/2022 1118   CREATININE 0.91 07/13/2022 1118   CREATININE 0.91 07/28/2021 0946   CALCIUM 9.6 07/13/2022 1118   PROT 7.4 07/13/2022 1118   ALBUMIN 4.6 07/13/2022 1118   AST 15 07/13/2022 1118   ALT 18 07/13/2022 1118   ALKPHOS 81 07/13/2022 1118   BILITOT 0.6 07/13/2022 1118   GFRNONAA >60 07/13/2022 1118   GFRNONAA 64 11/30/2019 0927   GFRAA 74 11/30/2019 0927     Lab Results  Component Value Date   WBC 7.1 07/13/2022   HGB 13.5 07/13/2022   HCT 39.7 07/13/2022   MCV 87.1 07/13/2022   PLT 231 07/13/2022    Lab Results  Component Value Date   CHOL 176 01/27/2022   HDL 48 (L) 01/27/2022   LDLCALC 97 01/27/2022   TRIG 213 (H) 01/27/2022   CHOLHDL 3.7 01/27/2022    Lab Results  Component Value Date   HGBA1C 5.5 12/08/2019     Lab Results  Component Value Date   TSH 3.49 01/27/2022     No results found for: "PSA1",  "PSA" *** delete for female pts  ***  Assessment & Plan:   ***      Annual wellness visit done today including the all of the following: Reviewed patient's Family Medical History Reviewed and updated list of patient's medical providers Assessment of cognitive impairment was done Assessed patient's functional ability Established a written schedule for health screening services Health Risk Assessent Completed and Reviewed  Discussed health benefits of physical activity, and encouraged her to engage in regular exercise appropriate for her age and condition.        I,Alexander Ruley,acting as a Neurosurgeon for Margaree Mackintosh, MD.,have documented all relevant documentation on the behalf of Margaree Mackintosh, MD,as directed by  Margaree Mackintosh, MD while in the presence of Margaree Mackintosh, MD.   ***

## 2022-08-03 DIAGNOSIS — F3341 Major depressive disorder, recurrent, in partial remission: Secondary | ICD-10-CM | POA: Diagnosis not present

## 2022-08-04 ENCOUNTER — Other Ambulatory Visit: Payer: PPO

## 2022-08-04 DIAGNOSIS — E559 Vitamin D deficiency, unspecified: Secondary | ICD-10-CM | POA: Diagnosis not present

## 2022-08-04 DIAGNOSIS — I10 Essential (primary) hypertension: Secondary | ICD-10-CM

## 2022-08-04 DIAGNOSIS — E039 Hypothyroidism, unspecified: Secondary | ICD-10-CM | POA: Diagnosis not present

## 2022-08-04 DIAGNOSIS — E785 Hyperlipidemia, unspecified: Secondary | ICD-10-CM | POA: Diagnosis not present

## 2022-08-05 LAB — CBC WITH DIFFERENTIAL/PLATELET
Absolute Monocytes: 410 cells/uL (ref 200–950)
Basophils Absolute: 32 cells/uL (ref 0–200)
Basophils Relative: 0.6 %
Eosinophils Absolute: 524 cells/uL — ABNORMAL HIGH (ref 15–500)
Eosinophils Relative: 9.7 %
HCT: 40.2 % (ref 35.0–45.0)
Hemoglobin: 13.3 g/dL (ref 11.7–15.5)
Lymphs Abs: 1966 cells/uL (ref 850–3900)
MCH: 29.8 pg (ref 27.0–33.0)
MCHC: 33.1 g/dL (ref 32.0–36.0)
MCV: 89.9 fL (ref 80.0–100.0)
MPV: 10 fL (ref 7.5–12.5)
Monocytes Relative: 7.6 %
Neutro Abs: 2468 cells/uL (ref 1500–7800)
Neutrophils Relative %: 45.7 %
Platelets: 239 10*3/uL (ref 140–400)
RBC: 4.47 10*6/uL (ref 3.80–5.10)
RDW: 12.7 % (ref 11.0–15.0)
Total Lymphocyte: 36.4 %
WBC: 5.4 10*3/uL (ref 3.8–10.8)

## 2022-08-05 LAB — LIPID PANEL
Cholesterol: 163 mg/dL (ref <200)
HDL: 48 mg/dL — ABNORMAL LOW (ref >=50)
LDL Cholesterol (Calc): 92 mg/dL (calc)
Non-HDL Cholesterol (Calc): 115 mg/dL (calc) (ref <130)
Total CHOL/HDL Ratio: 3.4 (calc) (ref <5.0)
Triglycerides: 129 mg/dL (ref <150)

## 2022-08-05 LAB — VITAMIN D 25 HYDROXY (VIT D DEFICIENCY, FRACTURES): Vit D, 25-Hydroxy: 54 ng/mL (ref 30–100)

## 2022-08-05 LAB — TSH: TSH: 2.39 mIU/L (ref 0.40–4.50)

## 2022-08-05 LAB — COMPLETE METABOLIC PANEL WITH GFR
AG Ratio: 1.7 (calc) (ref 1.0–2.5)
ALT: 28 U/L (ref 6–29)
AST: 26 U/L (ref 10–35)
Albumin: 4.5 g/dL (ref 3.6–5.1)
Alkaline phosphatase (APISO): 91 U/L (ref 37–153)
BUN: 21 mg/dL (ref 7–25)
CO2: 30 mmol/L (ref 20–32)
Calcium: 9.2 mg/dL (ref 8.6–10.4)
Chloride: 105 mmol/L (ref 98–110)
Creat: 0.86 mg/dL (ref 0.50–1.05)
Globulin: 2.7 g/dL (calc) (ref 1.9–3.7)
Glucose, Bld: 99 mg/dL (ref 65–99)
Potassium: 4.6 mmol/L (ref 3.5–5.3)
Sodium: 141 mmol/L (ref 135–146)
Total Bilirubin: 0.6 mg/dL (ref 0.2–1.2)
Total Protein: 7.2 g/dL (ref 6.1–8.1)
eGFR: 74 mL/min/{1.73_m2} (ref >=60)

## 2022-08-07 ENCOUNTER — Telehealth: Payer: Self-pay | Admitting: Internal Medicine

## 2022-08-07 ENCOUNTER — Ambulatory Visit: Payer: PPO | Admitting: Internal Medicine

## 2022-08-07 NOTE — Telephone Encounter (Signed)
LVM to CB, missed CPE appointment today

## 2022-08-10 ENCOUNTER — Ambulatory Visit: Payer: PPO | Admitting: Internal Medicine

## 2022-08-10 ENCOUNTER — Encounter: Payer: Self-pay | Admitting: Internal Medicine

## 2022-08-10 VITALS — BP 132/88 | HR 91 | Resp 16 | Ht 67.75 in | Wt 316.8 lb

## 2022-08-10 DIAGNOSIS — G4733 Obstructive sleep apnea (adult) (pediatric): Secondary | ICD-10-CM

## 2022-08-10 DIAGNOSIS — E782 Mixed hyperlipidemia: Secondary | ICD-10-CM | POA: Diagnosis not present

## 2022-08-10 DIAGNOSIS — Z8669 Personal history of other diseases of the nervous system and sense organs: Secondary | ICD-10-CM | POA: Diagnosis not present

## 2022-08-10 DIAGNOSIS — E559 Vitamin D deficiency, unspecified: Secondary | ICD-10-CM

## 2022-08-10 DIAGNOSIS — D126 Benign neoplasm of colon, unspecified: Secondary | ICD-10-CM | POA: Diagnosis not present

## 2022-08-10 DIAGNOSIS — Q211 Atrial septal defect, unspecified: Secondary | ICD-10-CM | POA: Diagnosis not present

## 2022-08-10 DIAGNOSIS — Z85820 Personal history of malignant melanoma of skin: Secondary | ICD-10-CM

## 2022-08-10 DIAGNOSIS — I10 Essential (primary) hypertension: Secondary | ICD-10-CM

## 2022-08-10 DIAGNOSIS — Z6841 Body Mass Index (BMI) 40.0 and over, adult: Secondary | ICD-10-CM | POA: Diagnosis not present

## 2022-08-10 DIAGNOSIS — E039 Hypothyroidism, unspecified: Secondary | ICD-10-CM | POA: Diagnosis not present

## 2022-08-10 DIAGNOSIS — Z8659 Personal history of other mental and behavioral disorders: Secondary | ICD-10-CM

## 2022-08-10 DIAGNOSIS — Z0000 Encounter for general adult medical examination without abnormal findings: Secondary | ICD-10-CM | POA: Diagnosis not present

## 2022-08-10 DIAGNOSIS — Z8639 Personal history of other endocrine, nutritional and metabolic disease: Secondary | ICD-10-CM

## 2022-08-10 LAB — POCT URINALYSIS DIPSTICK
Bilirubin, UA: NEGATIVE
Glucose, UA: NEGATIVE
Ketones, UA: NEGATIVE
Leukocytes, UA: NEGATIVE
Nitrite, UA: NEGATIVE
Protein, UA: NEGATIVE
Spec Grav, UA: 1.01 (ref 1.010–1.025)
Urobilinogen, UA: 0.2 E.U./dL
pH, UA: 6 (ref 5.0–8.0)

## 2022-08-10 MED ORDER — LEVOFLOXACIN 500 MG PO TABS: 500.0000 mg | ORAL_TABLET | Freq: Every day | ORAL | 0 refills | Status: AC

## 2022-08-10 MED ORDER — CLARITHROMYCIN 500 MG PO TABS: 500.0000 mg | ORAL_TABLET | Freq: Two times a day (BID) | ORAL | 0 refills | Status: DC

## 2022-08-24 DIAGNOSIS — Z6841 Body Mass Index (BMI) 40.0 and over, adult: Secondary | ICD-10-CM | POA: Diagnosis not present

## 2022-08-24 DIAGNOSIS — K5909 Other constipation: Secondary | ICD-10-CM | POA: Diagnosis not present

## 2022-08-24 DIAGNOSIS — I10 Essential (primary) hypertension: Secondary | ICD-10-CM | POA: Diagnosis not present

## 2022-08-24 NOTE — Progress Notes (Unsigned)
  Cardiology Office Note:  .   Date:  08/26/2022  ID:  Diana [name], Diana [name] Jul 20, 1955, MRN [id number] PCP: Margaree Mackintosh, MD  [facility] Providers Cardiologist:  Tonny Bollman, MD    Patient Profile: .      PMH: Atrial septal defect OSA on CPAP Morbid obesity Hypothyroidism Dyslipidemia Intolerance of rosuvastatin ~ significant myalgias Pravastatin ~ sleep disturbance Coronary calcium score of 0 on 08/12/2018 Carotid artery disease Vascular screen 05/2016 Mild plaque right carotid artery Family history Father - carotid endarterectomy and stroke but lived to age 13 Mother lived to age 44, dementia  She has been followed by Dr. Excell Seltzer for general cardiology.   Referred to Dr. Rennis Golden, [facility], for management of dyslipidemia.  She reported struggles with diet and weight loss. She was ultimately tried on atorvastatin and tolerated this without significant side effects and had good effect on lowering LDL.   Last lipid clinic visit was 08/26/21 with Dr. Rennis Golden.  She reported overall doing well. Had 20-25 lb weight gain since previous visit. Lipid results at that time: triglycerides 189, HDL 42 and LDL 96.  No changes were made to her statin therapy and one year follow-up was advised.        History of Present Illness: .   Diana [name] is a very pleasant 67 y.o. female who is here alone today for follow-up of dyslipidemia. She is feeling well. Most recent lab results include: LDL 92, triglycerides 129, HDL 48 on 08/04/22. She is very pleased with her improvement. She is now on Zepbound. Previously had success with Ozempic but when she up-titrated to highest dose, she had a very bad reaction and was off GLP1 for some time.  We discussed healthy diet and lifestyle changes in addition to Zepbound. She admits she loves pizza but is working on incorporating more healthy options and limiting portion sizes. Plans to meet with a nutritionist for healthy food options.  She  does not have any concerning cardiac symptoms today.  ROS: See HPI       Studies Reviewed: .         Risk Assessment/Calculations:             Physical Exam:   VS:  BP 136/82   Pulse 94   Ht 5' 7.75" (1.721 m)   Wt (!) 314 lb (142.4 kg)   BMI 48.10 kg/m    Wt Readings from Last 3 Encounters:  08/26/22 (!) 314 lb (142.4 kg)  08/10/22 (!) 316 lb 12 oz (143.7 kg)  07/14/22 (!) 316 lb (143.3 kg)    GEN: Obese, well developed in no acute distress NECK: No JVD; No carotid bruits CARDIAC: RRR, no murmurs, rubs, gallops RESPIRATORY:  Clear to auscultation without rales, wheezing or rhonchi  ABDOMEN: Soft, non-tender, non-distended EXTREMITIES:  No edema; No deformity     ASSESSMENT AND PLAN: .    Dyslipidemia: History of statin intolerance but is tolerating atorvastatin 20 mg daily with no adverse side effects. Recently started Zepbound for weight loss and plans to work with a nutritionist. We discussed the increased risk of exposure to LDL with age and that dietary improvements could contribute to her success in lowering LDL in addition to atorvastatin.  She additionally has mild carotid artery disease. Recommend repeat fasting labs in 1 year with follow-up afterwards.        Dispo: 1 year with Dr. Rennis Golden or me  Signed, Eligha Bridegroom, NP-C

## 2022-08-26 ENCOUNTER — Ambulatory Visit (HOSPITAL_BASED_OUTPATIENT_CLINIC_OR_DEPARTMENT_OTHER): Payer: PPO | Admitting: Nurse Practitioner

## 2022-08-26 ENCOUNTER — Encounter (HOSPITAL_BASED_OUTPATIENT_CLINIC_OR_DEPARTMENT_OTHER): Payer: Self-pay | Admitting: Nurse Practitioner

## 2022-08-26 VITALS — BP 136/82 | HR 94 | Ht 67.75 in | Wt 314.0 lb

## 2022-08-26 DIAGNOSIS — T466X5A Adverse effect of antihyperlipidemic and antiarteriosclerotic drugs, initial encounter: Secondary | ICD-10-CM

## 2022-08-26 DIAGNOSIS — E782 Mixed hyperlipidemia: Secondary | ICD-10-CM | POA: Diagnosis not present

## 2022-08-26 DIAGNOSIS — T466X5D Adverse effect of antihyperlipidemic and antiarteriosclerotic drugs, subsequent encounter: Secondary | ICD-10-CM

## 2022-08-26 DIAGNOSIS — M791 Myalgia, unspecified site: Secondary | ICD-10-CM | POA: Diagnosis not present

## 2022-08-26 DIAGNOSIS — I10 Essential (primary) hypertension: Secondary | ICD-10-CM | POA: Diagnosis not present

## 2022-08-26 NOTE — Patient Instructions (Signed)
Medication Instructions:   Your physician recommends that you continue on your current medications as directed. Please refer to the Current Medication list given to you today.   *If you need a refill on your cardiac medications before your next appointment, please call your pharmacy*   Lab Work:  None ordered.  If you have labs (blood work) drawn today and your tests are completely normal, you will receive your results only by: MyChart Message (if you have MyChart) OR A paper copy in the mail If you have any lab test that is abnormal or we need to change your treatment, we will call you to review the results.   Testing/Procedures:  None ordered.   Follow-Up: At [facility], you and your health needs are our priority.  As part of our continuing mission to provide you with exceptional heart care, we have created designated Provider Care Teams.  These Care Teams include your primary Cardiologist (physician) and Advanced Practice Providers (APPs -  Physician Assistants and Nurse Practitioners) who all work together to provide you with the care you need, when you need it.  We recommend signing up for the patient portal called "MyChart".  Sign up information is provided on this After Visit Summary.  MyChart is used to connect with patients for Virtual Visits (Telemedicine).  Patients are able to view lab/test results, encounter notes, upcoming appointments, etc.  Non-urgent messages can be sent to your provider as well.   To learn more about what you can do with MyChart, go to [email or web address].    Your next appointment:   1 year(s)  Provider:   Eligha Bridegroom, NP    Other Instructions  Your physician wants you to follow-up in: 1 year with lipid clinic.  You will receive a reminder letter in the mail two months in advance. If you don't receive a letter, please call our office to schedule the follow-up appointment.

## 2022-09-07 DIAGNOSIS — K5909 Other constipation: Secondary | ICD-10-CM | POA: Diagnosis not present

## 2022-09-07 DIAGNOSIS — I10 Essential (primary) hypertension: Secondary | ICD-10-CM | POA: Diagnosis not present

## 2022-09-07 DIAGNOSIS — Z6841 Body Mass Index (BMI) 40.0 and over, adult: Secondary | ICD-10-CM | POA: Diagnosis not present

## 2022-09-07 DIAGNOSIS — E65 Localized adiposity: Secondary | ICD-10-CM | POA: Diagnosis not present

## 2022-09-09 ENCOUNTER — Ambulatory Visit: Payer: PPO | Attending: Cardiovascular Disease | Admitting: Cardiovascular Disease

## 2022-09-09 ENCOUNTER — Encounter: Payer: Self-pay | Admitting: Cardiovascular Disease

## 2022-09-09 VITALS — BP 116/82 | HR 93 | Ht 68.0 in | Wt 315.0 lb

## 2022-09-09 DIAGNOSIS — Z01810 Encounter for preprocedural cardiovascular examination: Secondary | ICD-10-CM

## 2022-09-09 DIAGNOSIS — E782 Mixed hyperlipidemia: Secondary | ICD-10-CM

## 2022-09-09 DIAGNOSIS — Q211 Atrial septal defect, unspecified: Secondary | ICD-10-CM | POA: Diagnosis not present

## 2022-09-09 DIAGNOSIS — I10 Essential (primary) hypertension: Secondary | ICD-10-CM

## 2022-09-09 MED ORDER — DIAZEPAM 5 MG PO TABS: ORAL_TABLET | ORAL | 0 refills | Status: DC

## 2022-09-09 NOTE — Patient Instructions (Signed)
Medication Instructions:  TAKE Valium/Diazepam 5mg  prior to MRI *If you need a refill on your cardiac medications before your next appointment, please call your pharmacy*   Lab Work: CBC today If you have labs (blood work) drawn today and your tests are completely normal, you will receive your results only by: MyChart Message (if you have MyChart) OR A paper copy in the mail If you have any lab test that is abnormal or we need to change your treatment, we will call you to review the results.  Testing/Procedures: Cardiac MRI Your physician has requested that you have a cardiac MRI. Cardiac MRI uses a computer to create images of your heart as its beating, producing both still and moving pictures of your heart and major blood vessels. For further information please visit [email or web address]. Please follow the instruction sheet given to you today for more information.  Follow-Up: At [facility], you and your health needs are our priority.  As part of our continuing mission to provide you with exceptional heart care, we have created designated Provider Care Teams.  These Care Teams include your primary Cardiologist (physician) and Advanced Practice Providers (APPs -  Physician Assistants and Nurse Practitioners) who all work together to provide you with the care you need, when you need it.   Your next appointment:   1 year(s)  Provider:   Tonny Bollman, MD        You are scheduled for Cardiac MRI on ______________. Please arrive for your appointment at ______________ ( arrive 30-45 minutes prior to test start time). ?  [facility] [address] [place], [place] [address] [phone] Please take advantage of the free valet parking available at the [facility] and [employer] (Entrance C).  Proceed to the [facility] Radiology Department (First Floor) for check-in.   Magnetic resonance imaging (MRI) is a painless test that produces images of the inside of the  body without using Xrays.  During an MRI, strong magnets and radio waves work together in a Data processing manager to form detailed images.   MRI images may provide more details about a medical condition than X-rays, CT scans, and ultrasounds can provide.  You may be given earphones to listen for instructions.  You may eat a light breakfast and take medications as ordered with the exception of furosemide, hydrochlorothiazide, or spironolactone(fluid pill, other). Please avoid stimulants for 12 hr prior to test. (Ie. Caffeine, nicotine, chocolate, or antihistamine medications)  An IV will be inserted into one of your veins. Contrast material will be injected into your IV. It will leave your body through your urine within a day. You may be told to drink plenty of fluids to help flush the contrast material out of your system.  You will be asked to remove all metal, including: Watch, jewelry, and other metal objects including hearing aids, hair pieces and dentures. Also wearable glucose monitoring systems (ie. Freestyle Libre and Omnipods) (Braces and fillings normally are not a problem.)  TEST WILL TAKE APPROXIMATELY 1 HOUR  PLEASE NOTIFY SCHEDULING AT LEAST 24 HOURS IN ADVANCE IF YOU ARE UNABLE TO KEEP YOUR APPOINTMENT. [phone]  For more information and frequently asked questions, please visit our website : [email or web address]  Please call the Cardiac Imaging Nurse Navigators with any questions/concerns. [phone] Office

## 2022-09-09 NOTE — Progress Notes (Signed)
Cardiology Office Note:    Date:  09/09/2022   ID:  Diana, [name] 1955-12-07, MRN [id number]  PCP:  Margaree Mackintosh, MD   [facility] Providers Cardiologist:  Tonny Bollman, MD     Referring MD: Margaree Mackintosh, MD   Chief Complaint  Patient presents with   Follow-up    Atrial septal defect    History of Present Illness:    Diana [name] is a 67 y.o. female presenting for follow-up of obesity, atrial septal defect, and mixed hyperlipidemia with intolerance to lipid-lowering medicines.  The patient is here alone today.  She is doing well from a cardiovascular perspective.  She denies chest pain, chest pressure, shortness of breath, orthopnea, PND, or heart palpitations.  She is tolerating her medications without side effects.   Past Medical History:  Diagnosis Date   Anxiety    controlled   Atrial septal defect    Small,non significant atrial septal defect with QP/QS of 1.25 by MRA.   Cancer [facility])    melanoma left leg 2012   Complication of anesthesia    gets migraine when she has to be NPO   Depression    Hyperlipidemia    Hypertension    Pt doesn't take meds and denies having BP issues however her diostolic in alway in high 80s -90s   Hypothyroidism    Migraine    occasionally   Migraine headache    Obesity    Obstructive apnea    CPAP user since 2012.    PONV (postoperative nausea and vomiting)    n/v from migraine, not anesthesia   Primary localized osteoarthritis of left knee 08/02/2018   osteo   Pseudotumor cerebri Diagnosed in March 2008-   She sees Dr Lenis Noon in this regard   Sleep apnea    uses CPAP   Statin intolerance    with elevated CRP    Past Surgical History:  Procedure Laterality Date   BACK SURGERY     BIOPSY  07/04/2020   Procedure: BIOPSY;  Surgeon: Tressia Danas, MD;  Location: [facility];  Service: Gastroenterology;;   BUNIONECTOMY     COLONOSCOPY WITH PROPOFOL N/A 07/04/2020   Procedure: COLONOSCOPY  WITH PROPOFOL;  Surgeon: Tressia Danas, MD;  Location: [facility];  Service: Gastroenterology;  Laterality: N/A;   COLONOSCOPY WITH PROPOFOL N/A 11/10/2021   Procedure: COLONOSCOPY WITH PROPOFOL;  Surgeon: Tressia Danas, MD;  Location: [facility];  Service: Gastroenterology;  Laterality: N/A;   FRACTURE SURGERY     left 5th metatarsal   MASS EXCISION Left 03/22/2018   Procedure: EXCISION CYST DEBRIDEMENT DISTAL INTERPHALANGEAL LEFT MIDDLE FINGER;  Surgeon: Cindee Salt, MD;  Location: [facility];  Service: Orthopedics;  Laterality: Left;  FAB   ORTHOPEDIC SURGERY     lumbar surgery    PARTIAL KNEE ARTHROPLASTY Right 09/15/2016   Procedure: UNICOMPARTMENTAL KNEE;  Surgeon: Teryl Lucy, MD;  Location: [facility];  Service: Orthopedics;  Laterality: Right;   PARTIAL KNEE ARTHROPLASTY Left 08/02/2018   Procedure: UNICOMPARTMENTAL KNEE;  Surgeon: Teryl Lucy, MD;  Location: [facility];  Service: Orthopedics;  Laterality: Left;   pin in the fifth metatarsal     POLYPECTOMY  11/10/2021   Procedure: POLYPECTOMY;  Surgeon: Tressia Danas, MD;  Location: [facility];  Service: Gastroenterology;;   right knee arthroscopy     WISDOM TOOTH EXTRACTION      Current Medications: Current Meds  Medication Sig   ALPRAZolam (XANAX) 0.5 MG tablet Take  0.5 mg by mouth as needed for anxiety.   amLODipine (NORVASC) 5 MG tablet TAKE 1 TABLET (5 MG TOTAL) BY MOUTH DAILY.   Aspirin 81 MG CAPS Take 81 mg by mouth daily.   atorvastatin (LIPITOR) 20 MG tablet TAKE 1 TABLET BY MOUTH EVERYDAY AT BEDTIME   cetirizine (ZYRTEC) 10 MG tablet Take 10 mg by mouth daily.   Coenzyme Q10 (COQ-10) 100 MG CAPS Take 100 mg by mouth daily.   diazepam (VALIUM) 5 MG tablet Take 1 tablet by mouth one hour to MRI. May repeat second dose 30 minutes prior to scan if needed   levothyroxine (SYNTHROID) 75 MCG tablet TAKE 1 TABLET BY MOUTH EVERY DAY   PRISTIQ 100 MG 24 hr tablet Take 100 mg by mouth daily.    promethazine (PHENERGAN) 12.5 MG tablet Take 1 tablet (12.5 mg total) by mouth every 6 (six) hours as needed for nausea or vomiting.   SUMAtriptan (IMITREX) 100 MG tablet May repeat in 2 hours if headache persists or recurs. (Patient taking differently: as needed. May repeat in 2 hours if headache persists or recurs.)   tirzepatide (ZEPBOUND) 7.5 MG/0.5ML Pen Inject 7.5 mg into the skin once a week.   [DISCONTINUED] ergocalciferol (DRISDOL) 1.25 MG (50000 UT) capsule Take 1 capsule (50,000 Units total) by mouth once a week.   [DISCONTINUED] ZEPBOUND 5 MG/0.5ML Pen Inject 5 mg into the skin once a week.     Allergies:   Augmentin [amoxicillin-pot clavulanate]   Social History   Socioeconomic History   Marital status: Married    Spouse name: Fayrene Fearing   Number of children: 0   Years of education: masters   Highest education level: Master's degree (e.g., MA, MS, MEng, MEd, MSW, MBA)  Occupational History    Employer: [employer]    Comment: [employer]  Tobacco Use   Smoking status: Never   Smokeless tobacco: Never  Vaping Use   Vaping status: Never Used  Substance and Sexual Activity   Alcohol use: Not Currently    Alcohol/week: 0.0 - 3.0 standard drinks of alcohol    Comment: occasionally   Drug use: No   Sexual activity: Not Currently  Other Topics Concern   Not on file  Social History Narrative   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 She was referred for evaluation of what was previously been diagnosed as an ASD in 2001 at [facility]. She was advised to have it followed medically. She is marrried and lives with her spouse . She may have two glasses  of wine a week. She does not use tobacco. She sleeps with a CPAP.   Caffeine Use: 1-2 cups daily   Social Determinants of Health   Financial Resource Strain: Low Risk  (07/14/2022)   Overall Financial Resource Strain (CARDIA)    Difficulty of Paying Living Expenses: Not hard at all   Food Insecurity: No Food Insecurity (07/14/2022)   Hunger Vital Sign    Worried About Running Out of Food in the Last Year: Never true    Ran Out of Food in the Last Year: Never true  Transportation Needs: No Transportation Needs (07/14/2022)   PRAPARE - Administrator, Civil Service (Medical): No    Lack of Transportation (Non-Medical): No  Physical Activity: Insufficiently Active (07/14/2022)   Exercise Vital Sign    Days of Exercise per Week: 3 days    Minutes of Exercise per Session: 20  min  Stress: No Stress Concern Present (07/14/2022)   [employer] of Occupational Health - Occupational Stress Questionnaire    Feeling of Stress : Not at all  Social Connections: Socially Integrated (07/14/2022)   Social Connection and Isolation Panel [NHANES]    Frequency of Communication with Friends and Family: More than three times a week    Frequency of Social Gatherings with Friends and Family: Twice a week    Attends Religious Services: More than 4 times per year    Active Member of [employer] or Organizations: Yes    Attends Engineer, structural: More than 4 times per year    Marital Status: Married     Family History: The patient's family history includes Alzheimer's disease in her mother; Rheumatic fever in her father. There is no history of Colon cancer, Esophageal cancer, Stomach cancer, Colon polyps, or Rectal cancer.  ROS:   Please see the history of present illness.    All other systems reviewed and are negative.  EKGs/Labs/Other Studies Reviewed:    The following studies were reviewed today: 2D echocardiogram 04/17/2021:  1. Left ventricular ejection fraction, by estimation, is 55 to 60%. The  left ventricle has normal function. The left ventricle has no regional  wall motion abnormalities. Left ventricular diastolic parameters were  normal.   2. Right ventricular systolic function is normal. The right ventricular  size is mildly enlarged. There is normal  pulmonary artery systolic  pressure.   3. The mitral valve is normal in structure. No evidence of mitral valve  regurgitation. No evidence of mitral stenosis.   4. The aortic valve is tricuspid. Aortic valve regurgitation is not  visualized. No aortic stenosis is present.   5. The inferior vena cava is normal in size with greater than 50%  respiratory variability, suggesting right atrial pressure of 3 mmHg.   Comparison(s): No significant change from prior study.       Recent Labs: 07/13/2022: Magnesium 2.1 08/04/2022: ALT 28; BUN 21; Creat 0.86; Hemoglobin 13.3; Platelets 239; Potassium 4.6; Sodium 141; TSH 2.39  Recent Lipid Panel    Component Value Date/Time   CHOL 163 08/04/2022 0938   CHOL 171 08/20/2021 1115   TRIG 129 08/04/2022 0938   HDL 48 (L) 08/04/2022 0938   HDL 42 08/20/2021 1115   CHOLHDL 3.4 08/04/2022 0938   VLDL 35 (H) 02/13/2016 1115   LDLCALC 92 08/04/2022 0938     Risk Assessment/Calculations:                Physical Exam:    VS:  BP 116/82   Pulse 93   Ht 5\' 8"  (1.727 m)   Wt (!) 315 lb (142.9 kg)   SpO2 97%   BMI 47.90 kg/m     Wt Readings from Last 3 Encounters:  09/09/22 (!) 315 lb (142.9 kg)  08/26/22 (!) 314 lb (142.4 kg)  08/10/22 (!) 316 lb 12 oz (143.7 kg)     GEN:  Well nourished, well developed in no acute distress HEENT: Normal NECK: No JVD; No carotid bruits LYMPHATICS: No lymphadenopathy CARDIAC: RRR, no murmurs, rubs, gallops RESPIRATORY:  Clear to auscultation without rales, wheezing or rhonchi  ABDOMEN: Soft, non-tender, non-distended MUSCULOSKELETAL:  No edema; No deformity  SKIN: Warm and dry NEUROLOGIC:  Alert and oriented x 3 PSYCHIATRIC:  Normal affect   ASSESSMENT:    1. ASD (atrial septal defect)   2. Mixed hyperlipidemia   3. Essential hypertension   4. Pre-procedural cardiovascular  examination    PLAN:    In order of problems listed above:  The patient has a small secundum ASD.  She has been  asymptomatic over time.  Her last echocardiogram from 2023 showed mild RV enlargement.  I have recommended a cardiac MRI which will give [place] a more accurate assessment of her RV size and function as well as her overall cardiac chamber sizes.  I suspect we will continue to follow her conservatively as her ASD is small and probably not hemodynamically significant.  If she is demonstrating signs of RV enlargement, we will need to consider whether ASD closure is indicated. Treated with atorvastatin.  Working on lifestyle modification and weight loss.  Lipids have improved significantly with most recent cholesterol 163 and LDL of 92.  Previous coronary calcium score is 0. Blood pressure is controlled on amlodipine.  Continue to work on lifestyle modification.           Medication Adjustments/Labs and Tests Ordered: Current medicines are reviewed at length with the patient today.  Concerns regarding medicines are outlined above.  Orders Placed This Encounter  Procedures   MR CARDIAC MORPHOLOGY W WO CONTRAST   CBC   Meds ordered this encounter  Medications   diazepam (VALIUM) 5 MG tablet    Sig: Take 1 tablet by mouth one hour to MRI. May repeat second dose 30 minutes prior to scan if needed    Dispense:  2 tablet    Refill:  0    Patient Instructions  Medication Instructions:  TAKE Valium/Diazepam 5mg  prior to MRI *If you need a refill on your cardiac medications before your next appointment, please call your pharmacy*   Lab Work: CBC today If you have labs (blood work) drawn today and your tests are completely normal, you will receive your results only by: MyChart Message (if you have MyChart) OR A paper copy in the mail If you have any lab test that is abnormal or we need to change your treatment, we will call you to review the results.  Testing/Procedures: Cardiac MRI Your physician has requested that you have a cardiac MRI. Cardiac MRI uses a computer to create images of your  heart as its beating, producing both still and moving pictures of your heart and major blood vessels. For further information please visit [email or web address]. Please follow the instruction sheet given to you today for more information.  Follow-Up: At [facility], you and your health needs are our priority.  As part of our continuing mission to provide you with exceptional heart care, we have created designated Provider Care Teams.  These Care Teams include your primary Cardiologist (physician) and Advanced Practice Providers (APPs -  Physician Assistants and Nurse Practitioners) who all work together to provide you with the care you need, when you need it.   Your next appointment:   1 year(s)  Provider:   Tonny Bollman, MD        You are scheduled for Cardiac MRI on ______________. Please arrive for your appointment at ______________ ( arrive 30-45 minutes prior to test start time). ?  [facility] [address] [place], [place] [address] [phone] Please take advantage of the free valet parking available at the [facility] and [employer] (Entrance C).  Proceed to the [facility] Radiology Department (First Floor) for check-in.   Magnetic resonance imaging (MRI) is a painless test that produces images of the inside of the body without using Xrays.  During an MRI, strong  magnets and radio waves work together in a Data processing manager to form detailed images.   MRI images may provide more details about a medical condition than X-rays, CT scans, and ultrasounds can provide.  You may be given earphones to listen for instructions.  You may eat a light breakfast and take medications as ordered with the exception of furosemide, hydrochlorothiazide, or spironolactone(fluid pill, other). Please avoid stimulants for 12 hr prior to test. (Ie. Caffeine, nicotine, chocolate, or antihistamine medications)  An IV will be inserted into one of your veins. Contrast material will  be injected into your IV. It will leave your body through your urine within a day. You may be told to drink plenty of fluids to help flush the contrast material out of your system.  You will be asked to remove all metal, including: Watch, jewelry, and other metal objects including hearing aids, hair pieces and dentures. Also wearable glucose monitoring systems (ie. Freestyle Libre and Omnipods) (Braces and fillings normally are not a problem.)  TEST WILL TAKE APPROXIMATELY 1 HOUR  PLEASE NOTIFY SCHEDULING AT LEAST 24 HOURS IN ADVANCE IF YOU ARE UNABLE TO KEEP YOUR APPOINTMENT. [phone]  For more information and frequently asked questions, please visit our website : [email or web address]  Please call the Cardiac Imaging Nurse Navigators with any questions/concerns. [phone] Office     Signed, Tonny Bollman, MD  09/09/2022 4:55 PM    [facility]

## 2022-09-10 LAB — CBC
Hematocrit: 42.9 % (ref 34.0–46.6)
Hemoglobin: 13.7 g/dL (ref 11.1–15.9)
MCH: 28.1 pg (ref 26.6–33.0)
MCHC: 31.9 g/dL (ref 31.5–35.7)
MCV: 88 fL (ref 79–97)
Platelets: 217 10*3/uL (ref 150–450)
RBC: 4.88 x10E6/uL (ref 3.77–5.28)
RDW: 12.3 % (ref 11.7–15.4)
WBC: 6.6 10*3/uL (ref 3.4–10.8)

## 2022-09-15 DIAGNOSIS — Z6841 Body Mass Index (BMI) 40.0 and over, adult: Secondary | ICD-10-CM | POA: Diagnosis not present

## 2022-09-19 NOTE — Progress Notes (Signed)
Subjective:    Patient ID: Diana [name], female    DOB: 10-05-1955, 67 y.o.   MRN: [id number]  HPI 67 year old Female seen today for  health maintenance exam and evaluation of medical issues.  Patient has a history of sleep apnea treated with CPAP.  History of mixed hyperlipidemia.  Longstanding history of depression and sees Michaelia Permann at [facility].  Longstanding history of obesity.  Recently has taken Ozempic for weight loss through [employer].  Subsequently developed nausea and diarrhea on this drug and was seen in the emergency department July 13, 2022 for nausea and vomiting.  Was given IV fluids and promethazine.  Symptoms resolved.  Longstanding history of atrial septal defect diagnosed at [facility] in 2001, small nonsignificant by MRA.  History of melanoma left leg 2012.  History of hypertension and hyperlipidemia.  History of hypothyroidism and migraine headaches.  History of pseudotumor cerebri diagnosed in 2008  Underwent colonoscopy by Dr. Orvan Falconer in October 2023 and was found to have ascending colon ulcers.  This was a repeat study for follow-up and no ulcers were noted.  1 polyp was removed that was a tubular adenoma.  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  Enjoys traveling.  Married.  Social alcohol consumption.  Non-smoker.  Review of Systems no new complaints     Objective:   Physical Exam Vital signs reviewed.  Weight is 316 pounds 12 ounces BMI 48.52 blood pressure 132/88 pulse 91 regular, respiratory rate 16 pulse oximetry 98% Skin: Warm and dry.  Nodes none.  TMs clear.  Pharynx is clear.  Neck is supple.  No thyromegaly.  No carotid bruits.  Chest clear.  Cardiac exam: Regular rate and rhythm.  Abdomen is soft nondistended without hepatosplenomegaly masses or tenderness.  No lower extremity pitting edema.  Affect thought and judgment are normal.  No focal deficits on brief  neurological exam.      Assessment & Plan:  BMI 48.52-continue to work on diet exercise and weight loss  History of migraine headaches  Hypothyroidism stable with thyroid replacement medication  Hyperlipidemia-mixed  Essential hypertension  History of melanoma left leg in 2012  History of atrial septal defect-asymptomatic  History of sleep apnea treated with CPAP  Tubular adenoma of colon  History of pseudotumor cerebri  History of vitamin D deficiency  History of impaired glucose tolerance but fasting glucose is normal.  Hemoglobin A1c not checked with this visit.  Plan: Patient agrees to work on diet exercise and weight loss and follow-up in 6 months with fasting labs and office visit.  Subjective:   Patient presents for Medicare Annual/Subsequent preventive examination.  Review Past Medical/Family/Social:see above   Risk Factors  Current exercise habits: light Dietary issues discussed: yes  Cardiac risk factors:  Depression Screen  (Note: if answer to either of the following is "Yes", a more complete depression screening is indicated)   Over the past two weeks, have you felt down, depressed or hopeless? No  Over the past two weeks, have you felt little interest or pleasure in doing things? No Have you lost interest or pleasure in daily life? No Do you often feel hopeless? No Do you cry easily over simple problems? No   Activities of Daily Living  In your present state of health, do you have any difficulty performing the following activities?:   Driving? No  Managing money? No  Feeding yourself? No  Getting from bed to chair?  No  Climbing a flight of stairs? No  Preparing food and eating?: No  Bathing or showering? No  Getting dressed: No  Getting to the toilet? No  Using the toilet:No  Moving around from place to place: No  In the past year have you fallen or had a near fall?:No     Hearing Difficulties: No  Do you often ask people to speak up  or repeat themselves? No  Do you experience ringing or noises in your ears? No  Do you have difficulty understanding soft or whispered voices? No  Do you feel that you have a problem with memory? No Do you often misplace items? No    Home Safety:  Do you have a smoke alarm at your residence? Yes     Cognitive Testing  Alert? Yes Normal Appearance?Yes  Oriented to person? Yes Place? Yes  Time? Yes  Recall of three objects? Yes  Can perform simple calculations? Yes  Displays appropriate judgment?Yes  Can read the correct time from a watch face?Yes   List the Names of Other Physician/Practitioners you currently use:    Dr. Excell Seltzer- cardiology- had EKG in ED June 2024 when nauseated from Ozempic  Dr. Orvan Falconer- GI for colonoscopy  Review of Systems: see above   Objective:     General appearance: Appears stated age and  obese  Head: Normocephalic, without obvious abnormality, atraumatic  Eyes: conj clear, EOMi PEERLA  Ears: normal TM's and external ear canals both ears  Nose: Nares normal. Septum midline. Mucosa normal. No drainage or sinus tenderness.  Throat: lips, mucosa, and tongue normal; teeth and gums normal  Neck: no adenopathy, no carotid bruit, no JVD, supple, symmetrical, trachea midline and thyroid not enlarged, symmetric, no tenderness/mass/nodules  No CVA tenderness.  Lungs: clear to auscultation bilaterally  Breasts: normal appearance, no masses or tenderness, top of the pacemaker on left upper chest. Incision well-healed. It is tender.  Heart: regular rate and rhythm, S1, S2 normal, no murmur, click, rub or gallop  Abdomen: soft, non-tender; bowel sounds normal; no masses, no organomegaly  Musculoskeletal: ROM normal in all joints, no crepitus, no deformity, Normal muscle strengthen. Back  is symmetric, no curvature. Skin: Skin color, texture, turgor normal. No rashes or lesions  Lymph nodes: Cervical, supraclavicular, and axillary nodes normal.  Neurologic:  CN 2 -12 Normal, Normal symmetric reflexes. Normal coordination and gait  Psych: Alert & Oriented x 3, Mood appear stable.    Assessment:    Annual wellness medicare exam   Plan:    During the course of the visit the patient was educated and counseled about appropriate screening and preventive services including:   Vaccines discussed Colonoscopy up to date Needs mammogram dec 2024 Bone density done 2023     Patient Instructions (the written plan) was given to the patient.  Medicare Attestation  I have personally reviewed:  The patient's medical and social history  Their use of alcohol, tobacco or illicit drugs  Their current medications and supplements  The patient's functional ability including ADLs,fall risks, home safety risks, cognitive, and hearing and visual impairment  Diet and physical activities  Evidence for depression or mood disorders  The patient's weight, height, BMI, and visual acuity have been recorded in the chart. I have made referrals, counseling, and provided education to the patient based on review of the above and I have provided the patient with a written personalized care plan for preventive services.

## 2022-09-19 NOTE — Patient Instructions (Signed)
Patient agrees to work on weight loss efforts with diet and exercise and follow-up in 6 months with fasting labs and office visit.  Immunizations discussed.

## 2022-09-22 DIAGNOSIS — Z6841 Body Mass Index (BMI) 40.0 and over, adult: Secondary | ICD-10-CM | POA: Diagnosis not present

## 2022-09-22 DIAGNOSIS — K5909 Other constipation: Secondary | ICD-10-CM | POA: Diagnosis not present

## 2022-09-22 DIAGNOSIS — I10 Essential (primary) hypertension: Secondary | ICD-10-CM | POA: Diagnosis not present

## 2022-09-22 DIAGNOSIS — E65 Localized adiposity: Secondary | ICD-10-CM | POA: Diagnosis not present

## 2022-10-19 DIAGNOSIS — Z0142 Encounter for cervical smear to confirm findings of recent normal smear following initial abnormal smear: Secondary | ICD-10-CM | POA: Diagnosis not present

## 2022-10-19 DIAGNOSIS — Z01419 Encounter for gynecological examination (general) (routine) without abnormal findings: Secondary | ICD-10-CM | POA: Diagnosis not present

## 2022-10-19 DIAGNOSIS — Z124 Encounter for screening for malignant neoplasm of cervix: Secondary | ICD-10-CM | POA: Diagnosis not present

## 2022-10-19 DIAGNOSIS — Z6841 Body Mass Index (BMI) 40.0 and over, adult: Secondary | ICD-10-CM | POA: Diagnosis not present

## 2022-10-19 DIAGNOSIS — Z1272 Encounter for screening for malignant neoplasm of vagina: Secondary | ICD-10-CM | POA: Diagnosis not present

## 2022-10-19 DIAGNOSIS — Z1151 Encounter for screening for human papillomavirus (HPV): Secondary | ICD-10-CM | POA: Diagnosis not present

## 2022-11-02 DIAGNOSIS — E65 Localized adiposity: Secondary | ICD-10-CM | POA: Diagnosis not present

## 2022-11-02 DIAGNOSIS — I10 Essential (primary) hypertension: Secondary | ICD-10-CM | POA: Diagnosis not present

## 2022-11-02 DIAGNOSIS — Z6841 Body Mass Index (BMI) 40.0 and over, adult: Secondary | ICD-10-CM | POA: Diagnosis not present

## 2022-11-02 DIAGNOSIS — F411 Generalized anxiety disorder: Secondary | ICD-10-CM | POA: Diagnosis not present

## 2022-11-02 DIAGNOSIS — K5909 Other constipation: Secondary | ICD-10-CM | POA: Diagnosis not present

## 2022-11-16 DIAGNOSIS — E65 Localized adiposity: Secondary | ICD-10-CM | POA: Diagnosis not present

## 2022-11-16 DIAGNOSIS — Z6841 Body Mass Index (BMI) 40.0 and over, adult: Secondary | ICD-10-CM | POA: Diagnosis not present

## 2022-11-16 DIAGNOSIS — I10 Essential (primary) hypertension: Secondary | ICD-10-CM | POA: Diagnosis not present

## 2022-11-16 DIAGNOSIS — K5909 Other constipation: Secondary | ICD-10-CM | POA: Diagnosis not present

## 2022-11-19 ENCOUNTER — Encounter (HOSPITAL_COMMUNITY): Payer: Self-pay

## 2022-11-23 ENCOUNTER — Encounter (HOSPITAL_COMMUNITY): Payer: Self-pay

## 2022-11-23 ENCOUNTER — Other Ambulatory Visit: Payer: Self-pay | Admitting: Cardiovascular Disease

## 2022-11-23 ENCOUNTER — Ambulatory Visit (HOSPITAL_COMMUNITY)
Admission: RE | Admit: 2022-11-23 | Discharge: 2022-11-23 | Disposition: A | Discharge status: Home / Self Care | Payer: PPO | Source: Ambulatory Visit | Attending: Cardiovascular Disease | Admitting: Cardiovascular Disease

## 2022-11-23 DIAGNOSIS — Q211 Atrial septal defect, unspecified: Secondary | ICD-10-CM

## 2022-11-23 DIAGNOSIS — F3341 Major depressive disorder, recurrent, in partial remission: Secondary | ICD-10-CM | POA: Diagnosis not present

## 2022-11-26 DIAGNOSIS — Q211 Atrial septal defect, unspecified: Secondary | ICD-10-CM | POA: Diagnosis not present

## 2022-11-26 MED ORDER — GADOBUTROL 1 MMOL/ML IV SOLN
10.0000 mL | Freq: Once | INTRAVENOUS | Status: AC | PRN
  Administered 2022-11-26: 10 mL via INTRAVENOUS

## 2022-11-27 ENCOUNTER — Other Ambulatory Visit: Payer: Self-pay | Admitting: Medical Genetics

## 2022-11-27 DIAGNOSIS — Z006 Encounter for examination for normal comparison and control in clinical research program: Secondary | ICD-10-CM

## 2022-11-30 DIAGNOSIS — F411 Generalized anxiety disorder: Secondary | ICD-10-CM | POA: Diagnosis not present

## 2022-12-02 NOTE — Progress Notes (Signed)
PATIENT: Diana [name] DOB: 10/20/1955  REASON FOR VISIT: follow up HISTORY FROM: patient  Chief Complaint  Patient presents with   Room 1    Pt is here Alone. Pt states that things have been going good with her CPAP Machine. Pt states that she doesn't have any new questions or concerns to discuss today.      HISTORY OF PRESENT ILLNESS:  12/07/2022 ALL:  [name] returns for follow up for OSA on CPAP. She was last seen 08/2021 and we repeated HST that showed AHI 24/h and O2 nadir 82%. She reports that DME could not tell her what coverage she would have on new machine. She opted to purchase a travel machine out of pocket. She uses her travel machine, most days. She does travel often and really likes the convenience of the smaller machine. She admits that she does not change out supplies as often as she could. She is using a nasal mask.     08/20/2021 ALL: [name] returns for follow up for OSA on CPAP. She continues to do very well. She reports not being able to sleep without her machine. She is alternating between two machines. She continues to travel for work. She would like to get a new CPAP. May consider travel machine in the future.       08/20/2020 ALL: [name] returns for follow up for OSA on CPAP. She continues to do very well on therapy. She is using her machine or her travel machine every night. She denies concerns with CPAP or supplies.   Compliance report dated 07/21/2020-8/02-2020 combining her home CPAP and travel information shows that she used CPAP every night for greater than 4 hours. Average usage was about 8 hours. Residual AHI was 0.7 on 8-15cmH20. No significant leak noted.   07/20/2019 ALL:  Diana [name] is a 67 y.o. female here today for follow up for OSA on CPAP.  She is doing very well with CPAP therapy.  She uses her machine every night.  She denies any concerns or issues with her machine.  Compliance report dated 06/19/2019 through 07/18/2019 reveals that she  used CPAP every day for compliance of 100%.  She used CPAP greater than 4 hours every day.  Average usage was 7 hours and 13 minutes.  Residual AHI was 0.7 on 8 to 15 cm of water and an EPR of 1.  There was no significant leak noted.  She is feeling well today and without concerns.  HISTORY: (copied from my note on 07/04/2018)  Diana [name] is a 67 y.o. female here today for follow up of OSA on CPAP.  She is doing very well with CPAP therapy.  She is using her machine every night.  She states that she cannot sleep without it.  She is in need of new supplies.   06/04/2018 - 07/03/2018 07/03/2018 Usage days 30/30 days (100%) >= 4 hours 30 days (100%) < 4 hours 0 days (0%) Usage hours 231 hours 31 minutes Average usage (total days) 7 hours 43 minutes Average usage (days used) 7 hours 43 minutes Median usage (days used) 7 hours 43 minutes Total used hours (value since last reset - 07/03/2018) 7,298 hours AirSense 10 AutoSet Serial number [id number] Mode AutoSet Min Pressure 8 cmH2O Max Pressure 15 cmH2O EPR Fulltime EPR level 1 Response Standard Therapy Pressure - cmH2O Median: 9.0 95th percentile: 11.4 Maximum: 12.9 Leaks - L/min Median: 1.0 95th percentile: 13.2 Maximum: 21.4 Events per hour AI: 1.3  HI: 0.0 AHI: 1.3 Apnea Index Central: 0.1 Obstructive: 1.2 Unknown: 0.0 RERA Index 0.0 Cheyne-Stokes respiration (average duration per night) 0 minutes (0%)     History (copied from Dr Oliva Bustard note on 04/08/2017)   HPI: Diana [name] is a 67  y.o. female seen here at [facility] as a referral from Dr. Nolen Mu. She used to follow Dr. Sandria Manly, who signed her CPAP orders, but she was never scheduled for a formal sleep evaluation.  Diana [name] underwent a split study on 9-28 2012. She has seen me yearly since then and has an AutoSet CPAP machine. Her sleep habits have not changed. She still not taking naps, she is not a shift worker, she usually goes to bed between 10 and 11 PM and rises at  6.00 in the morning, Most of the nights she will need an alarm to wake up in the morning. She will be received fresh and restored after a nocturnal sleep of about 7-7-1/2 hours.    07-29-12 for a follow up  visit : The patient used the auto titrator between . Average daily usage was 7 hours and 42 minutes, which is a very good result. Residual apnea was 0.9 or. She had very minor air leaks and her 95th percentile pressure on CPAP was 10 cm the window or for this Ultracet was between 7 and 15 cm ater she endorsed the Epworth sleepiness score it only took points today. I would like for her to have the machine set at 10 cm water with a peak TR function. The patient has always been compliant with the CPAP but this is in [place] port at the CPAP actually has reduced her apnea to a single-digit orbital single-digit level. She's 100% compliant. She had been placed on an auto-titrator before the SPLIT study and slept well with it. She tried changing to a different sleep setting of 9 cm water, and resumed afterwards her old autoset CPAP machine.  The patient never had a tonsillectomy. During her youth on [place] used a Mining engineer for retrognathia. She has no TMJ. She has a deformed anterior chest wall, Her estimated nocturnal sleep time about 7 hours.  She  does not complain of nocturia, as no breakthrough snoring or witnessed apneas.  The patient's on the cardiology history is a patent foramen ovale, she has or a body weight of 239 pounds at 5 feet 8 inches. Since the last sleep study she first gained over 60 pounds and lost already 25. College years the patient has never worked shift work again., She does not take daytime naps. The patient is using her 67 year old autotitrator machine. She received a new one after her sleep study end of 2012 but was unable to tolerate it.  Fam. history: Her father has a history of excessive daytime sleepiness loud snoring and she witnessed several times apneas , also he was  never formally diagnosed. He lived to be 95.    09-11-2015 Mrs. Diana [name] presents today brokenhearted as her CPAP machine has a tight. He has used a AutoSet advantage the S8 machine for over 9 years. She has been a very compliant CPAP user with 100% compliance up to early August 2017 average user time of 7 hours 11 minutes, AutoSet is allowing pressures between 10 and 15 cm water was 1 cm EPR and the residual AHI was 1.2. Air leaks were very mild 91st percentile pressure was 12 cm water. I will sign a prescription and forwarded to [facility]  for a new CPAP machine I would like her to have an air sense machine with an O2 sat between 8 and 15 cm water and 1 cm EPR. I will ask Marvis Moeller to set the machine for her. She endorsed today the Epworth sleepiness score at 2 points, she does not endorse a high fatigue, she is not clinically depressed. She just would like to start CPAP.    Interval history on 04/02/2016, Mrs. Diana [name] is here for her regular CPAP compliance visits and she has done excellent. 100% compliant 7 hours and 3 minutes average user time for the last 30 days, she's using an AutoSet between 8 and 15 cm water, and the 95th percentile pressure is 13.1 cm at night. The residual apneas is 0.6 per hour. Her husband believes that he heard some breakthrough snoring at night. 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    Interval history from 08 April 2017, I have the pleasure of meeting today with shaundrea mulkey, a 67 year old Caucasian CPAP user ,with a 93% compliance for the last 30 days.  Average use of time 6 hours and 55 minutes on CPAP nightly, the machine is an AutoSet between 8 and 15 cmH2O pressure was 1 cm expiratory pressure relief.  Residual AHI is 0.7, 95th percentile pressure is 10.8, there are no central apneas emerging, there is no high air leak.  The patient has benefited from CPAP therapy as her Epworth sleepiness score is endorsed at only 2 points and her  fatigue severity at 10 points. She has noted how bad she felt when the power failed and she was without CPAP. She has a second, older CPAP machine at her second residence -    REVIEW OF SYSTEMS: Out of a complete 14 system review of symptoms, the patient complains only of the following symptoms, none and all other reviewed systems are negative.  ESS: 2/24, previously 3/24  ALLERGIES: Allergies  Allergen Reactions   Augmentin [Amoxicillin-Pot Clavulanate] Nausea And Vomiting and Other (See Comments)    Has patient had a PCN reaction causing immediate rash, facial/tongue/throat swelling, SOB or lightheadedness with hypotension: No Has patient had a PCN reaction causing severe rash involving mucus membranes or skin necrosis: No Has patient had a PCN reaction that required hospitalization: No Has patient had a PCN reaction occurring within the last 10 years: #  #  #  YES  #  #  #  > 2014 If all of the above answers are "NO", then may proceed with Cephalosporin use.     HOME MEDICATIONS: Outpatient Medications Prior to Visit  Medication Sig Dispense Refill   ALPRAZolam (XANAX) 0.5 MG tablet Take 0.5 mg by mouth as needed for anxiety.  0   amLODipine (NORVASC) 5 MG tablet TAKE 1 TABLET (5 MG TOTAL) BY MOUTH DAILY. 90 tablet 1   Aspirin 81 MG CAPS Take 81 mg by mouth daily.     atorvastatin (LIPITOR) 20 MG tablet TAKE 1 TABLET BY MOUTH EVERYDAY AT BEDTIME 90 tablet 1   cetirizine (ZYRTEC) 10 MG tablet Take 10 mg by mouth daily.     Cholecalciferol (VITAMIN D-3) 25 MCG (1000 UT) CAPS Take by mouth.     Coenzyme Q10 (COQ-10) 100 MG CAPS Take 100 mg by mouth daily.     levothyroxine (SYNTHROID) 75 MCG tablet TAKE 1 TABLET BY MOUTH EVERY DAY 90 tablet 3   PRISTIQ 100 MG 24 hr tablet Take 100 mg by mouth daily.  1   Semaglutide,0.25 or 0.5MG /DOS, (OZEMPIC, 0.25 OR 0.5 MG/DOSE,) 2 MG/1.5ML SOPN Inject into the skin.     SUMAtriptan (IMITREX) 100 MG tablet May repeat in 2 hours if headache  persists or recurs. (Patient taking differently: as needed. May repeat in 2 hours if headache persists or recurs.) 27 tablet 2   diazepam (VALIUM) 5 MG tablet Take 1 tablet by mouth one hour to MRI. May repeat second dose 30 minutes prior to scan if needed 2 tablet 0   promethazine (PHENERGAN) 12.5 MG tablet Take 1 tablet (12.5 mg total) by mouth every 6 (six) hours as needed for nausea or vomiting. (Patient not taking: Reported on 12/07/2022) 15 tablet 0   tirzepatide (ZEPBOUND) 7.5 MG/0.5ML Pen Inject 7.5 mg into the skin once a week.     No facility-administered medications prior to visit.    PAST MEDICAL HISTORY: Past Medical History:  Diagnosis Date   Anxiety    controlled   Atrial septal defect    Small,non significant atrial septal defect with QP/QS of 1.25 by MRA.   Cancer [facility])    melanoma left leg 2012   Complication of anesthesia    gets migraine when she has to be NPO   Depression    Hyperlipidemia    Hypertension    Pt doesn't take meds and denies having BP issues however her diostolic in alway in high 80s -90s   Hypothyroidism    Migraine    occasionally   Migraine headache    Obesity    Obstructive apnea    CPAP user since 2012.    PONV (postoperative nausea and vomiting)    n/v from migraine, not anesthesia   Primary localized osteoarthritis of left knee 08/02/2018   osteo   Pseudotumor cerebri Diagnosed in March 2008-   She sees Dr Lenis Noon in this regard   Sleep apnea    uses CPAP   Statin intolerance    with elevated CRP    PAST SURGICAL HISTORY: Past Surgical History:  Procedure Laterality Date   BACK SURGERY     BIOPSY  07/04/2020   Procedure: BIOPSY;  Surgeon: Tressia Danas, MD;  Location: [facility];  Service: Gastroenterology;;   BUNIONECTOMY     COLONOSCOPY WITH PROPOFOL N/A 07/04/2020   Procedure: COLONOSCOPY WITH PROPOFOL;  Surgeon: Tressia Danas, MD;  Location: [facility];  Service: Gastroenterology;  Laterality: N/A;    COLONOSCOPY WITH PROPOFOL N/A 11/10/2021   Procedure: COLONOSCOPY WITH PROPOFOL;  Surgeon: Tressia Danas, MD;  Location: [facility];  Service: Gastroenterology;  Laterality: N/A;   FRACTURE SURGERY     left 5th metatarsal   MASS EXCISION Left 03/22/2018   Procedure: EXCISION CYST DEBRIDEMENT DISTAL INTERPHALANGEAL LEFT MIDDLE FINGER;  Surgeon: Cindee Salt, MD;  Location: [facility];  Service: Orthopedics;  Laterality: Left;  FAB   ORTHOPEDIC SURGERY     lumbar surgery    PARTIAL KNEE ARTHROPLASTY Right 09/15/2016   Procedure: UNICOMPARTMENTAL KNEE;  Surgeon: Teryl Lucy, MD;  Location: [facility];  Service: Orthopedics;  Laterality: Right;   PARTIAL KNEE ARTHROPLASTY Left 08/02/2018   Procedure: UNICOMPARTMENTAL KNEE;  Surgeon: Teryl Lucy, MD;  Location: [facility];  Service: Orthopedics;  Laterality: Left;   pin in the fifth metatarsal     POLYPECTOMY  11/10/2021   Procedure: POLYPECTOMY;  Surgeon: Tressia Danas, MD;  Location: [facility];  Service: Gastroenterology;;   right knee arthroscopy     WISDOM TOOTH EXTRACTION      FAMILY  HISTORY: Family History  Problem Relation Age of Onset   Alzheimer's disease Mother    Rheumatic fever Father    Colon cancer Neg Hx    Esophageal cancer Neg Hx    Stomach cancer Neg Hx    Colon polyps Neg Hx    Rectal cancer Neg Hx     SOCIAL HISTORY: Social History   Socioeconomic History   Marital status: Married    Spouse name: Fayrene Fearing   Number of children: 0   Years of education: masters   Highest education level: Master's degree (e.g., MA, MS, MEng, MEd, MSW, MBA)  Occupational History    Employer: [employer]    Comment: [employer]  Tobacco Use   Smoking status: Never   Smokeless tobacco: Never  Vaping Use   Vaping status: Never Used  Substance and Sexual Activity   Alcohol use: Not Currently    Alcohol/week: 0.0 - 3.0 standard drinks of alcohol    Comment: occasionally   Drug use: No    Sexual activity: Not Currently  Other Topics Concern   Not on file  Social History Narrative   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 She was referred for evaluation of what was previously been diagnosed as an ASD in 2001 at [facility]. She was advised to have it followed medically. She is marrried and lives with her spouse . She may have two glasses  of wine a week. She does not use tobacco. She sleeps with a CPAP.   Caffeine Use: 1-2 cups daily   Social Determinants of Health   Financial Resource Strain: Low Risk  (07/14/2022)   Overall Financial Resource Strain (CARDIA)    Difficulty of Paying Living Expenses: Not hard at all  Food Insecurity: No Food Insecurity (07/14/2022)   Hunger Vital Sign    Worried About Running Out of Food in the Last Year: Never true    Ran Out of Food in the Last Year: Never true  Transportation Needs: No Transportation Needs (07/14/2022)   PRAPARE - Administrator, Civil Service (Medical): No    Lack of Transportation (Non-Medical): No  Physical Activity: Insufficiently Active (07/14/2022)   Exercise Vital Sign    Days of Exercise per Week: 3 days    Minutes of Exercise per Session: 20 min  Stress: No Stress Concern Present (07/14/2022)   [employer] of Occupational Health - Occupational Stress Questionnaire    Feeling of Stress : Not at all  Social Connections: Socially Integrated (07/14/2022)   Social Connection and Isolation Panel [NHANES]    Frequency of Communication with Friends and Family: More than three times a week    Frequency of Social Gatherings with Friends and Family: Twice a week    Attends Religious Services: More than 4 times per year    Active Member of [employer] or Organizations: Yes    Attends Engineer, structural: More than 4 times per year    Marital Status: Married  Catering manager Violence: Not on file      PHYSICAL EXAM  Vitals:   12/07/22 0830  BP: (!) 149/85  Pulse:  85  Weight: (!) 327 lb (148.3 kg)  Height: 5\' 8"  (1.727 m)     Body mass index is 49.72 kg/m.  Generalized: Well developed, in no acute distress  Neck circumference 16" Cardiology: normal rate and rhythm, no murmur noted Respiratory: clear to auscultation bilaterally  Neurological examination  Mentation: Alert oriented to time, place, history taking. Follows all commands speech and language fluent Cranial nerve II-XII: Pupils were equal round reactive to light. Extraocular movements were full, visual field were full  Motor: The motor testing reveals 5 over 5 strength of all 4 extremities. Good symmetric motor tone is noted throughout.  Gait and station: Gait is normal.   DIAGNOSTIC DATA (LABS, IMAGING, TESTING) - I reviewed patient records, labs, notes, testing and imaging myself where available.      No data to display           Lab Results  Component Value Date   WBC 6.6 09/09/2022   HGB 13.7 09/09/2022   HCT 42.9 09/09/2022   MCV 88 09/09/2022   PLT 217 09/09/2022      Component Value Date/Time   NA 141 08/04/2022 0938   K 4.6 08/04/2022 0938   CL 105 08/04/2022 0938   CO2 30 08/04/2022 0938   GLUCOSE 99 08/04/2022 0938   BUN 21 08/04/2022 0938   CREATININE 0.86 08/04/2022 0938   CALCIUM 9.2 08/04/2022 0938   PROT 7.2 08/04/2022 0938   ALBUMIN 4.6 07/13/2022 1118   AST 26 08/04/2022 0938   ALT 28 08/04/2022 0938   ALKPHOS 81 07/13/2022 1118   BILITOT 0.6 08/04/2022 0938   GFRNONAA >60 07/13/2022 1118   GFRNONAA 64 11/30/2019 0927   GFRAA 74 11/30/2019 0927   Lab Results  Component Value Date   CHOL 163 08/04/2022   HDL 48 (L) 08/04/2022   LDLCALC 92 08/04/2022   TRIG 129 08/04/2022   CHOLHDL 3.4 08/04/2022   Lab Results  Component Value Date   HGBA1C 5.5 12/08/2019   No results found for: "VITAMINB12" Lab Results  Component Value Date   TSH 2.39 08/04/2022      ASSESSMENT AND PLAN 67 y.o. year old female  has a past medical history of  Anxiety, Atrial septal defect, Cancer (HCC), Complication of anesthesia, Depression, Hyperlipidemia, Hypertension, Hypothyroidism, Migraine, Migraine headache, Obesity, Obstructive apnea, PONV (postoperative nausea and vomiting), Primary localized osteoarthritis of left knee (08/02/2018), Pseudotumor cerebri (Diagnosed in March 2008-), Sleep apnea, and Statin intolerance. here with     ICD-10-CM   1. OSA on CPAP  G47.33        Paije is doing very well on CPAP therapy. Now using a travel machine most every night. Compliance report reveals excellent compliance.  I have encouraged her to continue using CPAP nightly and for greater than 4 hours each night.  We will update supply orders and sent to DME. Healthy lifestyle habits encouraged.  She will continue close follow-up with primary care.  She will follow-up with me in 1 year.  She verbalizes understanding and agreement with this plan.   No orders of the defined types were placed in this encounter.     No orders of the defined types were placed in this encounter.      Shawnie Dapper, FNP-C 12/07/2022, 8:32 AM [facility] [address] [place], [place] [address] [phone]

## 2022-12-02 NOTE — Patient Instructions (Addendum)
Please continue using your CPAP regularly. While your insurance requires that you use CPAP at least 4 hours each night on 70% of the nights, I recommend, that you not skip any nights and use it throughout the night if you can. Getting used to CPAP and staying with the treatment long term does take time and patience and discipline. Untreated obstructive sleep apnea when it is moderate to severe can have an adverse impact on cardiovascular health and raise her risk for heart disease, arrhythmias, hypertension, congestive heart failure, stroke and diabetes. Untreated obstructive sleep apnea causes sleep disruption, nonrestorative sleep, and sleep deprivation. This can have an impact on your day to day functioning and cause daytime sleepiness and impairment of cognitive function, memory loss, mood disturbance, and problems focussing. Using CPAP regularly can improve these symptoms.  We will update supply orders, today. You can call [facility] to order new supplies.   Follow up in 1 year

## 2022-12-03 ENCOUNTER — Other Ambulatory Visit: Payer: Self-pay | Admitting: Internal Medicine

## 2022-12-03 ENCOUNTER — Telehealth: Payer: Self-pay | Admitting: *Deleted

## 2022-12-03 DIAGNOSIS — Z1231 Encounter for screening mammogram for malignant neoplasm of breast: Secondary | ICD-10-CM

## 2022-12-03 NOTE — Telephone Encounter (Signed)
Called patient asked her to bring cpap machine along with powercord to visit on 12/07/22.

## 2022-12-07 ENCOUNTER — Ambulatory Visit: Payer: PPO | Admitting: Family Medicine

## 2022-12-07 ENCOUNTER — Encounter: Payer: Self-pay | Admitting: Family Medicine

## 2022-12-07 VITALS — BP 149/85 | HR 85 | Ht 68.0 in | Wt 327.0 lb

## 2022-12-07 DIAGNOSIS — G4733 Obstructive sleep apnea (adult) (pediatric): Secondary | ICD-10-CM | POA: Diagnosis not present

## 2022-12-13 ENCOUNTER — Encounter (HOSPITAL_BASED_OUTPATIENT_CLINIC_OR_DEPARTMENT_OTHER): Payer: Self-pay

## 2022-12-14 ENCOUNTER — Other Ambulatory Visit: Payer: Self-pay

## 2022-12-14 DIAGNOSIS — K5909 Other constipation: Secondary | ICD-10-CM | POA: Diagnosis not present

## 2022-12-14 DIAGNOSIS — I10 Essential (primary) hypertension: Secondary | ICD-10-CM | POA: Diagnosis not present

## 2022-12-14 DIAGNOSIS — Z6841 Body Mass Index (BMI) 40.0 and over, adult: Secondary | ICD-10-CM | POA: Diagnosis not present

## 2022-12-14 DIAGNOSIS — E65 Localized adiposity: Secondary | ICD-10-CM | POA: Diagnosis not present

## 2022-12-14 MED ORDER — ATORVASTATIN CALCIUM 20 MG PO TABS: 20.0000 mg | ORAL_TABLET | Freq: Every day | ORAL | 2 refills | Status: DC

## 2022-12-21 ENCOUNTER — Other Ambulatory Visit: Payer: Self-pay | Admitting: Internal Medicine

## 2022-12-28 ENCOUNTER — Other Ambulatory Visit (HOSPITAL_COMMUNITY): Payer: Self-pay

## 2022-12-28 DIAGNOSIS — E65 Localized adiposity: Secondary | ICD-10-CM | POA: Diagnosis not present

## 2022-12-28 DIAGNOSIS — Z6841 Body Mass Index (BMI) 40.0 and over, adult: Secondary | ICD-10-CM | POA: Diagnosis not present

## 2022-12-28 DIAGNOSIS — I10 Essential (primary) hypertension: Secondary | ICD-10-CM | POA: Diagnosis not present

## 2022-12-28 DIAGNOSIS — K5909 Other constipation: Secondary | ICD-10-CM | POA: Diagnosis not present

## 2023-01-04 ENCOUNTER — Ambulatory Visit
Admission: RE | Admit: 2023-01-04 | Discharge: 2023-01-04 | Disposition: A | Discharge status: Home / Self Care | Payer: PPO | Source: Ambulatory Visit

## 2023-01-04 DIAGNOSIS — Z1231 Encounter for screening mammogram for malignant neoplasm of breast: Secondary | ICD-10-CM

## 2023-01-04 DIAGNOSIS — Z6841 Body Mass Index (BMI) 40.0 and over, adult: Secondary | ICD-10-CM | POA: Diagnosis not present

## 2023-01-19 ENCOUNTER — Other Ambulatory Visit (HOSPITAL_COMMUNITY)
Admission: RE | Admit: 2023-01-19 | Discharge: 2023-01-19 | Disposition: A | Discharge status: Home / Self Care | Payer: Self-pay | Source: Ambulatory Visit | Attending: Oncology | Admitting: Oncology

## 2023-01-19 DIAGNOSIS — Z006 Encounter for examination for normal comparison and control in clinical research program: Secondary | ICD-10-CM | POA: Insufficient documentation

## 2023-01-25 DIAGNOSIS — I10 Essential (primary) hypertension: Secondary | ICD-10-CM | POA: Diagnosis not present

## 2023-01-25 DIAGNOSIS — K5909 Other constipation: Secondary | ICD-10-CM | POA: Diagnosis not present

## 2023-01-25 DIAGNOSIS — E65 Localized adiposity: Secondary | ICD-10-CM | POA: Diagnosis not present

## 2023-01-25 DIAGNOSIS — E66813 Obesity, class 3: Secondary | ICD-10-CM | POA: Diagnosis not present

## 2023-01-25 DIAGNOSIS — Z6841 Body Mass Index (BMI) 40.0 and over, adult: Secondary | ICD-10-CM | POA: Diagnosis not present

## 2023-02-01 LAB — GENECONNECT MOLECULAR SCREEN: Genetic Analysis Overall Interpretation: NEGATIVE

## 2023-02-09 ENCOUNTER — Other Ambulatory Visit: Payer: PPO

## 2023-02-09 DIAGNOSIS — Z0000 Encounter for general adult medical examination without abnormal findings: Secondary | ICD-10-CM

## 2023-02-09 DIAGNOSIS — Z6841 Body Mass Index (BMI) 40.0 and over, adult: Secondary | ICD-10-CM | POA: Diagnosis not present

## 2023-02-09 DIAGNOSIS — K5909 Other constipation: Secondary | ICD-10-CM | POA: Diagnosis not present

## 2023-02-09 DIAGNOSIS — I10 Essential (primary) hypertension: Secondary | ICD-10-CM

## 2023-02-09 DIAGNOSIS — E65 Localized adiposity: Secondary | ICD-10-CM | POA: Diagnosis not present

## 2023-02-09 DIAGNOSIS — R7302 Impaired glucose tolerance (oral): Secondary | ICD-10-CM

## 2023-02-09 DIAGNOSIS — E66813 Obesity, class 3: Secondary | ICD-10-CM | POA: Diagnosis not present

## 2023-02-09 DIAGNOSIS — Z85820 Personal history of malignant melanoma of skin: Secondary | ICD-10-CM

## 2023-02-09 DIAGNOSIS — E039 Hypothyroidism, unspecified: Secondary | ICD-10-CM | POA: Diagnosis not present

## 2023-02-09 DIAGNOSIS — E782 Mixed hyperlipidemia: Secondary | ICD-10-CM

## 2023-02-10 LAB — LIPID PANEL
Cholesterol: 171 mg/dL (ref <200)
HDL: 48 mg/dL — ABNORMAL LOW (ref >=50)
LDL Cholesterol (Calc): 97 mg/dL
Non-HDL Cholesterol (Calc): 123 mg/dL (ref <130)
Total CHOL/HDL Ratio: 3.6 (calc) (ref <5.0)
Triglycerides: 162 mg/dL — ABNORMAL HIGH (ref <150)

## 2023-02-10 LAB — COMPLETE METABOLIC PANEL WITH GFR
AG Ratio: 1.6 (calc) (ref 1.0–2.5)
ALT: 16 U/L (ref 6–29)
AST: 18 U/L (ref 10–35)
Albumin: 4.5 g/dL (ref 3.6–5.1)
Alkaline phosphatase (APISO): 96 U/L (ref 37–153)
BUN: 21 mg/dL (ref 7–25)
CO2: 29 mmol/L (ref 20–32)
Calcium: 9.2 mg/dL (ref 8.6–10.4)
Chloride: 101 mmol/L (ref 98–110)
Creat: 0.91 mg/dL (ref 0.50–1.05)
Globulin: 2.8 g/dL (ref 1.9–3.7)
Glucose, Bld: 89 mg/dL (ref 65–99)
Potassium: 4.5 mmol/L (ref 3.5–5.3)
Sodium: 140 mmol/L (ref 135–146)
Total Bilirubin: 0.4 mg/dL (ref 0.2–1.2)
Total Protein: 7.3 g/dL (ref 6.1–8.1)
eGFR: 68 mL/min/{1.73_m2} (ref >=60)

## 2023-02-10 LAB — CBC WITH DIFFERENTIAL/PLATELET
Absolute Lymphocytes: 1848 {cells}/uL (ref 850–3900)
Absolute Monocytes: 366 {cells}/uL (ref 200–950)
Basophils Absolute: 50 {cells}/uL (ref 0–200)
Basophils Relative: 0.8 %
Eosinophils Absolute: 391 {cells}/uL (ref 15–500)
Eosinophils Relative: 6.3 %
HCT: 41 % (ref 35.0–45.0)
Hemoglobin: 13.5 g/dL (ref 11.7–15.5)
MCH: 28.8 pg (ref 27.0–33.0)
MCHC: 32.9 g/dL (ref 32.0–36.0)
MCV: 87.4 fL (ref 80.0–100.0)
MPV: 10.5 fL (ref 7.5–12.5)
Monocytes Relative: 5.9 %
Neutro Abs: 3546 {cells}/uL (ref 1500–7800)
Neutrophils Relative %: 57.2 %
Platelets: 253 10*3/uL (ref 140–400)
RBC: 4.69 10*6/uL (ref 3.80–5.10)
RDW: 12.3 % (ref 11.0–15.0)
Total Lymphocyte: 29.8 %
WBC: 6.2 10*3/uL (ref 3.8–10.8)

## 2023-02-10 LAB — HEMOGLOBIN A1C
Hgb A1c MFr Bld: 5.4 %{Hb} (ref <5.7)
Mean Plasma Glucose: 108 mg/dL
eAG (mmol/L): 6 mmol/L

## 2023-02-10 LAB — TSH: TSH: 2.86 m[IU]/L (ref 0.40–4.50)

## 2023-02-15 ENCOUNTER — Ambulatory Visit (INDEPENDENT_AMBULATORY_CARE_PROVIDER_SITE_OTHER): Payer: PPO | Admitting: Internal Medicine

## 2023-02-15 ENCOUNTER — Encounter: Payer: Self-pay | Admitting: Internal Medicine

## 2023-02-15 VITALS — BP 120/84 | HR 81 | Ht 68.0 in | Wt 330.0 lb

## 2023-02-15 DIAGNOSIS — E039 Hypothyroidism, unspecified: Secondary | ICD-10-CM

## 2023-02-15 DIAGNOSIS — I10 Essential (primary) hypertension: Secondary | ICD-10-CM

## 2023-02-15 DIAGNOSIS — Z8659 Personal history of other mental and behavioral disorders: Secondary | ICD-10-CM

## 2023-02-15 DIAGNOSIS — Q211 Atrial septal defect, unspecified: Secondary | ICD-10-CM

## 2023-02-15 DIAGNOSIS — G4733 Obstructive sleep apnea (adult) (pediatric): Secondary | ICD-10-CM

## 2023-02-15 DIAGNOSIS — Z6841 Body Mass Index (BMI) 40.0 and over, adult: Secondary | ICD-10-CM | POA: Diagnosis not present

## 2023-02-15 DIAGNOSIS — Z8639 Personal history of other endocrine, nutritional and metabolic disease: Secondary | ICD-10-CM

## 2023-02-15 DIAGNOSIS — Z8669 Personal history of other diseases of the nervous system and sense organs: Secondary | ICD-10-CM | POA: Diagnosis not present

## 2023-02-15 DIAGNOSIS — E782 Mixed hyperlipidemia: Secondary | ICD-10-CM | POA: Diagnosis not present

## 2023-02-15 DIAGNOSIS — Z85820 Personal history of malignant melanoma of skin: Secondary | ICD-10-CM

## 2023-02-15 NOTE — Progress Notes (Signed)
Patient Care Team: Margaree Mackintosh, MD as PCP - General (Internal Medicine) Tonny Bollman, MD as PCP - Cardiology (Cardiology)  Visit Date: 02/15/23  Subjective:   Chief Complaint  Patient presents with   Medical Management of Chronic Issues   Hypertension   Hyperlipidemia   Patient ZO:XWRUE Scheryl, Sanborn DOB:08-30-1955,68 y.o. AVW:[id number]   68 y.o. Female presents today for 6 months follow-up for HTN & HLD.  History of Hypertension treated with 5 mg Amlodipine daily. Blood Pressure: normotensive at 120/84  History of Hyperlipidemia treated with 20 mg Atorvastatin daily. 02/09/23 Lipid Panel with HDL 48 and TRIG 162.   History of Hypothyroidism treated with 75 mcg Levothyroxine. 02/09/23 TSH 2.86.   Still working on her weight loss. Today's weight is 330 LBS with BMI 50.18 - which is a 27 pound decrease from 12/07/22. Seeing a nutritionist, but notes that she isn't often at-home. She does state that she is still losing weight, just not as quickly as she'd prefer. Still going to [facility] and seeing Dr. Jeanice Lim.   Still wearing CPAP for her OSA.   Mammogram 01/04/23 was normal.   Had [facility] Molecular Screening 01/19/23, which was NEG for pathogenic or likely pathogenic variants   Colonoscopy done October 2023 was normal except for the removal of a flat 2 mm polyp from the cecum.   Had an MR of heart to look at ASD 11/23/22 ordered by Dr. Excell Seltzer.    Labs 02/09/23:  CBC WNL CMP WNL HgbA1c 5.4  Past Medical History:  Diagnosis Date   Anxiety    controlled   Atrial septal defect    Small,non significant atrial septal defect with QP/QS of 1.25 by MRA.   Cancer [facility])    melanoma left leg 2012   Complication of anesthesia    gets migraine when she has to be NPO   Depression    Hyperlipidemia    Hypertension    Pt doesn't take meds and denies having BP issues however her diostolic in alway in high 80s -90s   Hypothyroidism    Migraine    occasionally    Migraine headache    Obesity    Obstructive apnea    CPAP user since 2012.    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 She was referred for evaluation of what was previously been diagnosed as an ASD in 2001 at [facility]. She was advised to have it followed medically. She is marrried and lives with her spouse . She may have two glasses  of wine a week. She does not use tobacco. She sleeps with a CPAP.   Caffeine Use: 1-2 cups daily   Review of Systems  Constitutional:  Negative for fever and malaise/fatigue.  HENT:  Negative for congestion.   Eyes:  Negative for blurred vision.  Respiratory:  Negative for cough and shortness of breath.   Cardiovascular:  Negative for chest pain, palpitations and leg swelling.  Gastrointestinal:  Negative for vomiting.  Musculoskeletal:  Negative for back pain.  Skin:  Negative for rash.  Neurological:  Negative for loss of consciousness and headaches.     Objective:  Vitals: BP 120/84   Pulse 81   Ht 5\' 8"  (1.727 m)   Wt (!) 330 lb (149.7 kg)   SpO2 97%   BMI 50.18 kg/m   Physical Exam Vitals and nursing note reviewed.  Constitutional:      General: She is not in acute distress.    Appearance: Normal appearance. She is not toxic-appearing.  HENT:     Head: Normocephalic and atraumatic.  Cardiovascular:     Rate and Rhythm: Normal rate  and regular rhythm. No extrasystoles are present.    Pulses: Normal pulses.     Heart sounds: Normal heart sounds. No murmur heard.    No friction rub. No gallop.  Pulmonary:     Effort: Pulmonary effort is normal. No respiratory distress.     Breath sounds: Normal breath sounds. No wheezing or rales.  Skin:    General: Skin is warm and dry.  Neurological:     Mental Status: She is alert and oriented to person, place, and time. Mental status is at baseline.  Psychiatric:        Mood and Affect: Mood normal.        Behavior: Behavior normal.        Thought Content: Thought content normal.        Judgment: Judgment normal.    Results:  Studies Obtained And Personally Reviewed By Me:  MR CARDIAC VELOCITY FLOW MAP 11/27/22 CLINICAL DATA:  Secundum ASD   EXAM: CARDIAC MRI  FINDINGS: Left ventricle:   -Mild hypertrophy   -Normal size   -Normal systolic function   -Normal ECV (23%) and T2 values   -No LGE   LV EF:  60% (Normal 52-79%)   Absolute volumes:   LV EDV: (Normal 78-167 mL)   LV ESV: 67mL (Normal 21-64 mL)   LV SV: 99mL (Normal 52-114 mL)   CO: 8.2L/min (Normal 2.7-6.3 L/min)   Indexed volumes:   LV EDV: 27mL/sq-m (Normal 50-96 mL/sq-m)   LV ESV: 55mL/sq-m (Normal 10-40 mL/sq-m)   LV SV: 55mL/sq-m (Normal 33-64 mL/sq-m)   CI: 3.1L/min/sq-m (Normal 1.9-3.9 L/min/sq-m)   Right ventricle: Normal size and systolic function   RV EF: 59% (Normal 52-80%)   Absolute volumes:   RV EDV: (Normal 79-175 mL)   RV ESV: 76mL (Normal 13-75 mL)   RV SV: (Normal 56-110 mL)   CO: 9.2L/min (Normal 2.7-6 L/min)   Indexed volumes:   RV EDV: 21mL/sq-m (Normal 51-97 mL/sq-m)   RV ESV: 29mL/sq-m (Normal 9-42 mL/sq-m)   RV SV: 48mL/sq-m (Normal 35-61 mL/sq-m)   CI: 3.5L/min/sq-m (Normal 1.8-3.8 L/min/sq-m)   Left atrium: Normal size.  Secundum ASD.  Qp/Qs is 1.1   Right atrium: Normal size   Mitral valve: Trivial regurgitation    Aortic valve: Tricuspid.  Trivial regurgitation   Tricuspid valve: Trivial regurgitation   Pulmonic valve: Trivial regurgitation   Aorta: Normal proximal ascending aorta   Pericardium: Normal   IMPRESSION: 1. Secundum ASD. Qp/Qs 1.1 and normal RV size suggests small ASD without significant shunting  2. Normal LV size, mild hypertrophy, and normal systolic function (EF 60%)   3.  Normal RV size and systolic function (EF 59%)   4.  No late gadolinium enhancement to suggest myocardial scar  Labs:     Component Value Date/Time   NA 140 02/09/2023 0923   K 4.5 02/09/2023 0923   CL 101 02/09/2023 0923   CO2 29 02/09/2023 0923   GLUCOSE 89 02/09/2023 0923   BUN 21 02/09/2023 0923   CREATININE 0.91 02/09/2023 0923   CALCIUM 9.2 02/09/2023 0923   PROT 7.3 02/09/2023 0923   ALBUMIN 4.6 07/13/2022 1118   AST 18 02/09/2023 0923   ALT 16 02/09/2023 0923   ALKPHOS 81 07/13/2022 1118   BILITOT 0.4 02/09/2023 0923   GFRNONAA >60 07/13/2022 1118   GFRNONAA 64 11/30/2019 0927   GFRAA 74 11/30/2019 0927    Lab Results  Component Value Date   WBC 6.2 02/09/2023   HGB 13.5 02/09/2023   HCT 41.0 02/09/2023   MCV 87.4 02/09/2023   PLT 253 02/09/2023   Lab Results  Component Value Date   CHOL 171 02/09/2023   HDL 48 (L) 02/09/2023   LDLCALC 97 02/09/2023   TRIG 162 (H) 02/09/2023   CHOLHDL 3.6 02/09/2023   Lab Results  Component Value Date   HGBA1C 5.4 02/09/2023    Lab Results  Component Value Date   TSH 2.86 02/09/2023   Assessment & Plan:  Other Labs Reviewed from 02/09/23:  CBC WNL CMP WNL HgbA1c 5.4 %- excellent  Hypertension treated with 5 mg Amlodipine daily. Blood Pressure: normotensive at 120/84  Hyperlipidemia treated with 20 mg Atorvastatin daily. 02/09/23 Lipid Panel with HDL 48 and TRIG 162.   Hypothyroidism treated with 75 mcg Levothyroxine. 02/09/23 TSH 2.86.   Still working on weight loss for Obesity w/BMI 50.18. Today's weight is 330 LBS with BMI  50.18 - which is a 27 pound decrease from 12/07/22. Seeing a nutritionist, but notes that she isn't often at-home. She does state that she is still losing weight, just not as quickly as she'd prefer. Still going to [facility] and seeing Dr. Jeanice Lim. Is on semaglutide.  Still wearing CPAP for her Obstructive Sleep Apnea (OSA).   Vitamin D deficiency - level was 54 when checked 6 months ago.  Hx of secundum ASD seen by Dr. Excell Seltzer. Thought to be small without signifiant shunting  Mammogram 01/04/23 was normal.   Colonoscopy done October 2023 was normal except for the removal of a flat 2 mm polyp from the cecum.   Had an MR of heart to look at ASD 11/23/22 ordered by Dr. Excell Seltzer. Normal LV function.Has secundum ASD thought to be small.    I,Emily Lagle,acting as a Neurosurgeon for Margaree Mackintosh, MD.,have documented all relevant documentation on the behalf of Margaree Mackintosh, MD,as directed by  Margaree Mackintosh, MD while in the presence of Margaree Mackintosh, MD.   I, Margaree Mackintosh, MD, have reviewed all documentation for this visit. The documentation on 02/15/23 for the exam, diagnosis, procedures, and orders are all accurate and complete.

## 2023-02-15 NOTE — Patient Instructions (Addendum)
Labs are stable Triglycerides are mildly elevated. Please continue same meds. Please continue diet and exercise efforts. Return in 6 months. It was a pleasure to see you today.

## 2023-02-22 DIAGNOSIS — E66813 Obesity, class 3: Secondary | ICD-10-CM | POA: Diagnosis not present

## 2023-02-22 DIAGNOSIS — E65 Localized adiposity: Secondary | ICD-10-CM | POA: Diagnosis not present

## 2023-02-22 DIAGNOSIS — I10 Essential (primary) hypertension: Secondary | ICD-10-CM | POA: Diagnosis not present

## 2023-02-22 DIAGNOSIS — Z6841 Body Mass Index (BMI) 40.0 and over, adult: Secondary | ICD-10-CM | POA: Diagnosis not present

## 2023-02-22 DIAGNOSIS — K5909 Other constipation: Secondary | ICD-10-CM | POA: Diagnosis not present

## 2023-03-08 DIAGNOSIS — K5909 Other constipation: Secondary | ICD-10-CM | POA: Diagnosis not present

## 2023-03-08 DIAGNOSIS — E65 Localized adiposity: Secondary | ICD-10-CM | POA: Diagnosis not present

## 2023-03-08 DIAGNOSIS — E66813 Obesity, class 3: Secondary | ICD-10-CM | POA: Diagnosis not present

## 2023-03-08 DIAGNOSIS — I10 Essential (primary) hypertension: Secondary | ICD-10-CM | POA: Diagnosis not present

## 2023-03-08 DIAGNOSIS — Z6841 Body Mass Index (BMI) 40.0 and over, adult: Secondary | ICD-10-CM | POA: Diagnosis not present

## 2023-03-22 DIAGNOSIS — E65 Localized adiposity: Secondary | ICD-10-CM | POA: Diagnosis not present

## 2023-03-22 DIAGNOSIS — Z6841 Body Mass Index (BMI) 40.0 and over, adult: Secondary | ICD-10-CM | POA: Diagnosis not present

## 2023-03-22 DIAGNOSIS — E66813 Obesity, class 3: Secondary | ICD-10-CM | POA: Diagnosis not present

## 2023-03-22 DIAGNOSIS — F411 Generalized anxiety disorder: Secondary | ICD-10-CM | POA: Diagnosis not present

## 2023-03-22 DIAGNOSIS — K5909 Other constipation: Secondary | ICD-10-CM | POA: Diagnosis not present

## 2023-03-22 DIAGNOSIS — I10 Essential (primary) hypertension: Secondary | ICD-10-CM | POA: Diagnosis not present

## 2023-03-29 DIAGNOSIS — F520 Hypoactive sexual desire disorder: Secondary | ICD-10-CM | POA: Diagnosis not present

## 2023-03-29 DIAGNOSIS — Z6841 Body Mass Index (BMI) 40.0 and over, adult: Secondary | ICD-10-CM | POA: Diagnosis not present

## 2023-04-05 DIAGNOSIS — E65 Localized adiposity: Secondary | ICD-10-CM | POA: Diagnosis not present

## 2023-04-05 DIAGNOSIS — I10 Essential (primary) hypertension: Secondary | ICD-10-CM | POA: Diagnosis not present

## 2023-04-05 DIAGNOSIS — Z6841 Body Mass Index (BMI) 40.0 and over, adult: Secondary | ICD-10-CM | POA: Diagnosis not present

## 2023-04-05 DIAGNOSIS — E66813 Obesity, class 3: Secondary | ICD-10-CM | POA: Diagnosis not present

## 2023-04-05 DIAGNOSIS — K5909 Other constipation: Secondary | ICD-10-CM | POA: Diagnosis not present

## 2023-04-20 DIAGNOSIS — K5909 Other constipation: Secondary | ICD-10-CM | POA: Diagnosis not present

## 2023-04-20 DIAGNOSIS — Z6841 Body Mass Index (BMI) 40.0 and over, adult: Secondary | ICD-10-CM | POA: Diagnosis not present

## 2023-04-20 DIAGNOSIS — E66813 Obesity, class 3: Secondary | ICD-10-CM | POA: Diagnosis not present

## 2023-04-20 DIAGNOSIS — I10 Essential (primary) hypertension: Secondary | ICD-10-CM | POA: Diagnosis not present

## 2023-04-20 DIAGNOSIS — E65 Localized adiposity: Secondary | ICD-10-CM | POA: Diagnosis not present

## 2023-04-22 DIAGNOSIS — N912 Amenorrhea, unspecified: Secondary | ICD-10-CM | POA: Diagnosis not present

## 2023-04-22 DIAGNOSIS — F520 Hypoactive sexual desire disorder: Secondary | ICD-10-CM | POA: Diagnosis not present

## 2023-05-03 DIAGNOSIS — F411 Generalized anxiety disorder: Secondary | ICD-10-CM | POA: Diagnosis not present

## 2023-05-04 DIAGNOSIS — Z6841 Body Mass Index (BMI) 40.0 and over, adult: Secondary | ICD-10-CM | POA: Diagnosis not present

## 2023-05-04 DIAGNOSIS — E65 Localized adiposity: Secondary | ICD-10-CM | POA: Diagnosis not present

## 2023-05-04 DIAGNOSIS — G4733 Obstructive sleep apnea (adult) (pediatric): Secondary | ICD-10-CM | POA: Diagnosis not present

## 2023-05-04 DIAGNOSIS — E66813 Obesity, class 3: Secondary | ICD-10-CM | POA: Diagnosis not present

## 2023-05-04 DIAGNOSIS — I10 Essential (primary) hypertension: Secondary | ICD-10-CM | POA: Diagnosis not present

## 2023-05-04 DIAGNOSIS — K5909 Other constipation: Secondary | ICD-10-CM | POA: Diagnosis not present

## 2023-05-17 DIAGNOSIS — I10 Essential (primary) hypertension: Secondary | ICD-10-CM | POA: Diagnosis not present

## 2023-05-17 DIAGNOSIS — E65 Localized adiposity: Secondary | ICD-10-CM | POA: Diagnosis not present

## 2023-05-17 DIAGNOSIS — E66813 Obesity, class 3: Secondary | ICD-10-CM | POA: Diagnosis not present

## 2023-05-17 DIAGNOSIS — K5909 Other constipation: Secondary | ICD-10-CM | POA: Diagnosis not present

## 2023-05-17 DIAGNOSIS — Z6841 Body Mass Index (BMI) 40.0 and over, adult: Secondary | ICD-10-CM | POA: Diagnosis not present

## 2023-05-17 DIAGNOSIS — G4733 Obstructive sleep apnea (adult) (pediatric): Secondary | ICD-10-CM | POA: Diagnosis not present

## 2023-06-15 DIAGNOSIS — Z6841 Body Mass Index (BMI) 40.0 and over, adult: Secondary | ICD-10-CM | POA: Diagnosis not present

## 2023-06-15 DIAGNOSIS — I10 Essential (primary) hypertension: Secondary | ICD-10-CM | POA: Diagnosis not present

## 2023-06-15 DIAGNOSIS — E66813 Obesity, class 3: Secondary | ICD-10-CM | POA: Diagnosis not present

## 2023-06-15 DIAGNOSIS — G4733 Obstructive sleep apnea (adult) (pediatric): Secondary | ICD-10-CM | POA: Diagnosis not present

## 2023-06-15 DIAGNOSIS — K5909 Other constipation: Secondary | ICD-10-CM | POA: Diagnosis not present

## 2023-06-15 DIAGNOSIS — E65 Localized adiposity: Secondary | ICD-10-CM | POA: Diagnosis not present

## 2023-06-18 ENCOUNTER — Other Ambulatory Visit: Payer: Self-pay | Admitting: Internal Medicine

## 2023-06-18 ENCOUNTER — Other Ambulatory Visit: Payer: Self-pay | Admitting: Cardiovascular Disease

## 2023-06-21 DIAGNOSIS — D225 Melanocytic nevi of trunk: Secondary | ICD-10-CM | POA: Diagnosis not present

## 2023-06-21 DIAGNOSIS — L738 Other specified follicular disorders: Secondary | ICD-10-CM | POA: Diagnosis not present

## 2023-06-21 DIAGNOSIS — L821 Other seborrheic keratosis: Secondary | ICD-10-CM | POA: Diagnosis not present

## 2023-06-21 DIAGNOSIS — Z85828 Personal history of other malignant neoplasm of skin: Secondary | ICD-10-CM | POA: Diagnosis not present

## 2023-06-21 DIAGNOSIS — D2272 Melanocytic nevi of left lower limb, including hip: Secondary | ICD-10-CM | POA: Diagnosis not present

## 2023-06-21 DIAGNOSIS — D1801 Hemangioma of skin and subcutaneous tissue: Secondary | ICD-10-CM | POA: Diagnosis not present

## 2023-06-21 DIAGNOSIS — F3341 Major depressive disorder, recurrent, in partial remission: Secondary | ICD-10-CM | POA: Diagnosis not present

## 2023-06-21 DIAGNOSIS — Z85820 Personal history of malignant melanoma of skin: Secondary | ICD-10-CM | POA: Diagnosis not present

## 2023-06-25 DIAGNOSIS — M25511 Pain in right shoulder: Secondary | ICD-10-CM | POA: Diagnosis not present

## 2023-06-29 DIAGNOSIS — E65 Localized adiposity: Secondary | ICD-10-CM | POA: Diagnosis not present

## 2023-06-29 DIAGNOSIS — G4733 Obstructive sleep apnea (adult) (pediatric): Secondary | ICD-10-CM | POA: Diagnosis not present

## 2023-06-29 DIAGNOSIS — Z6841 Body Mass Index (BMI) 40.0 and over, adult: Secondary | ICD-10-CM | POA: Diagnosis not present

## 2023-06-29 DIAGNOSIS — K5909 Other constipation: Secondary | ICD-10-CM | POA: Diagnosis not present

## 2023-06-29 DIAGNOSIS — I10 Essential (primary) hypertension: Secondary | ICD-10-CM | POA: Diagnosis not present

## 2023-06-29 DIAGNOSIS — E66813 Obesity, class 3: Secondary | ICD-10-CM | POA: Diagnosis not present

## 2023-07-06 ENCOUNTER — Other Ambulatory Visit: Payer: Self-pay | Admitting: Family

## 2023-07-26 DIAGNOSIS — Z6841 Body Mass Index (BMI) 40.0 and over, adult: Secondary | ICD-10-CM | POA: Diagnosis not present

## 2023-07-26 DIAGNOSIS — M25511 Pain in right shoulder: Secondary | ICD-10-CM | POA: Diagnosis not present

## 2023-07-26 DIAGNOSIS — E6689 Other obesity not elsewhere classified: Secondary | ICD-10-CM | POA: Diagnosis not present

## 2023-08-02 DIAGNOSIS — E66813 Obesity, class 3: Secondary | ICD-10-CM | POA: Diagnosis not present

## 2023-08-02 DIAGNOSIS — E65 Localized adiposity: Secondary | ICD-10-CM | POA: Diagnosis not present

## 2023-08-02 DIAGNOSIS — Z6841 Body Mass Index (BMI) 40.0 and over, adult: Secondary | ICD-10-CM | POA: Diagnosis not present

## 2023-08-02 DIAGNOSIS — I10 Essential (primary) hypertension: Secondary | ICD-10-CM | POA: Diagnosis not present

## 2023-08-02 DIAGNOSIS — K5909 Other constipation: Secondary | ICD-10-CM | POA: Diagnosis not present

## 2023-08-02 DIAGNOSIS — G4733 Obstructive sleep apnea (adult) (pediatric): Secondary | ICD-10-CM | POA: Diagnosis not present

## 2023-08-16 ENCOUNTER — Other Ambulatory Visit

## 2023-08-16 DIAGNOSIS — R7302 Impaired glucose tolerance (oral): Secondary | ICD-10-CM

## 2023-08-16 DIAGNOSIS — E039 Hypothyroidism, unspecified: Secondary | ICD-10-CM | POA: Diagnosis not present

## 2023-08-16 DIAGNOSIS — E782 Mixed hyperlipidemia: Secondary | ICD-10-CM

## 2023-08-16 DIAGNOSIS — Z0000 Encounter for general adult medical examination without abnormal findings: Secondary | ICD-10-CM | POA: Diagnosis not present

## 2023-08-16 DIAGNOSIS — I10 Essential (primary) hypertension: Secondary | ICD-10-CM | POA: Diagnosis not present

## 2023-08-17 ENCOUNTER — Other Ambulatory Visit: Payer: PPO

## 2023-08-17 LAB — CBC WITH DIFFERENTIAL/PLATELET
Absolute Lymphocytes: 2050 {cells}/uL (ref 850–3900)
Absolute Monocytes: 360 {cells}/uL (ref 200–950)
Basophils Absolute: 49 {cells}/uL (ref 0–200)
Basophils Relative: 0.8 %
Eosinophils Absolute: 397 {cells}/uL (ref 15–500)
Eosinophils Relative: 6.5 %
HCT: 40 % (ref 35.0–45.0)
Hemoglobin: 12.8 g/dL (ref 11.7–15.5)
MCH: 28.3 pg (ref 27.0–33.0)
MCHC: 32 g/dL (ref 32.0–36.0)
MCV: 88.3 fL (ref 80.0–100.0)
MPV: 10.8 fL (ref 7.5–12.5)
Monocytes Relative: 5.9 %
Neutro Abs: 3245 {cells}/uL (ref 1500–7800)
Neutrophils Relative %: 53.2 %
Platelets: 203 Thousand/uL (ref 140–400)
RBC: 4.53 Million/uL (ref 3.80–5.10)
RDW: 13.9 % (ref 11.0–15.0)
Total Lymphocyte: 33.6 %
WBC: 6.1 Thousand/uL (ref 3.8–10.8)

## 2023-08-17 LAB — HEMOGLOBIN A1C
Hgb A1c MFr Bld: 5.5 % (ref <5.7)
Mean Plasma Glucose: 111 mg/dL
eAG (mmol/L): 6.2 mmol/L

## 2023-08-17 LAB — COMPLETE METABOLIC PANEL WITHOUT GFR
AG Ratio: 1.7 (calc) (ref 1.0–2.5)
ALT: 17 U/L (ref 6–29)
AST: 20 U/L (ref 10–35)
Albumin: 4 g/dL (ref 3.6–5.1)
Alkaline phosphatase (APISO): 79 U/L (ref 37–153)
BUN: 19 mg/dL (ref 7–25)
CO2: 27 mmol/L (ref 20–32)
Calcium: 9 mg/dL (ref 8.6–10.4)
Chloride: 105 mmol/L (ref 98–110)
Creat: 0.79 mg/dL (ref 0.60–1.00)
Globulin: 2.4 g/dL (ref 1.9–3.7)
Glucose, Bld: 89 mg/dL (ref 65–99)
Potassium: 4.3 mmol/L (ref 3.5–5.3)
Sodium: 141 mmol/L (ref 135–146)
Total Bilirubin: 0.3 mg/dL (ref 0.2–1.2)
Total Protein: 6.4 g/dL (ref 6.1–8.1)

## 2023-08-17 LAB — LIPID PANEL
Cholesterol: 129 mg/dL (ref <200)
HDL: 39 mg/dL — ABNORMAL LOW (ref >=50)
LDL Cholesterol (Calc): 65 mg/dL
Non-HDL Cholesterol (Calc): 90 mg/dL (ref <130)
Total CHOL/HDL Ratio: 3.3 (calc) (ref <5.0)
Triglycerides: 176 mg/dL — ABNORMAL HIGH (ref <150)

## 2023-08-17 LAB — TSH: TSH: 1.34 m[IU]/L (ref 0.40–4.50)

## 2023-08-20 ENCOUNTER — Ambulatory Visit (INDEPENDENT_AMBULATORY_CARE_PROVIDER_SITE_OTHER): Payer: PPO | Admitting: Internal Medicine

## 2023-08-20 ENCOUNTER — Encounter: Payer: Self-pay | Admitting: Internal Medicine

## 2023-08-20 VITALS — BP 122/78 | HR 80 | Ht 68.0 in | Wt 309.0 lb

## 2023-08-20 DIAGNOSIS — E039 Hypothyroidism, unspecified: Secondary | ICD-10-CM | POA: Diagnosis not present

## 2023-08-20 DIAGNOSIS — Z0000 Encounter for general adult medical examination without abnormal findings: Secondary | ICD-10-CM

## 2023-08-20 DIAGNOSIS — D126 Benign neoplasm of colon, unspecified: Secondary | ICD-10-CM | POA: Diagnosis not present

## 2023-08-20 DIAGNOSIS — I10 Essential (primary) hypertension: Secondary | ICD-10-CM | POA: Diagnosis not present

## 2023-08-20 DIAGNOSIS — Z8659 Personal history of other mental and behavioral disorders: Secondary | ICD-10-CM | POA: Diagnosis not present

## 2023-08-20 DIAGNOSIS — E782 Mixed hyperlipidemia: Secondary | ICD-10-CM | POA: Diagnosis not present

## 2023-08-20 DIAGNOSIS — Z8639 Personal history of other endocrine, nutritional and metabolic disease: Secondary | ICD-10-CM

## 2023-08-20 DIAGNOSIS — G4733 Obstructive sleep apnea (adult) (pediatric): Secondary | ICD-10-CM

## 2023-08-20 DIAGNOSIS — Z85820 Personal history of malignant melanoma of skin: Secondary | ICD-10-CM | POA: Diagnosis not present

## 2023-08-20 DIAGNOSIS — Z8669 Personal history of other diseases of the nervous system and sense organs: Secondary | ICD-10-CM | POA: Diagnosis not present

## 2023-08-20 MED ORDER — SUMATRIPTAN SUCCINATE 100 MG PO TABS: ORAL_TABLET | ORAL | 2 refills | Status: AC

## 2023-08-20 NOTE — Progress Notes (Signed)
 Annual Wellness Visit   Patient Care Team: [name] Spieler, Ronal PARAS, MD as PCP - General (Internal Medicine) Wonda Sharper, MD as PCP - Cardiology (Cardiology)  Visit Date: 08/20/23  08/20/2023  Chief Complaint  Patient presents with   Medicare Wellness   Annual Exam   Subjective:  Patient: Diana [name], Female DOB: [birth year], 68 y.o. MRN: [id number]  Hae Ahlers is a 68 y.o. Female who presents today for her Annual Wellness Visit. Patient has Depression; History of Malignant Melanoma of Skin; Migraine Headache; Vitamin-D Deficiency; Upper Airway Resistance Syndrome; OSA On CPAP; Hyperlipidemia; Hypothyroidism; Morbid Obesity due to Excess Calories; Hypertension; S/p Knee Replacement; Primary Localized Osteoarthritis, Left Knee; S/p Left Unicompartmental Knee Replacement; Upper Airway Cough Syndrome; Colon Ulcer; Colon Cancer Screening; Dependence on CPAP Ventilation; Class 3 Severe Obesity due to Excess Calories w/ Serious Comorbidity and Body Mass Index (BMI) of 45.0-49.9; and Adenomatous Polyp of Ascending Colon.  History of Hypertension treated with Amlodipine  5 mg daily. Blood Pressure: normotensive today at 122/78.  History of Hyperlipidemia treated with Atorvastatin  20 mg at bedtime. 08/16/2023 Lipid Panel, compared to 07/2022: HDL 39, decreased from 48; Triglycerides 176, elevated from 129; otherwise WNL. 2020 Coronary Calcium  Score: 0.  History of Obesity; BMI 45+. Since 01/2023, when she weighed 330 lbs, she has lost 21 pounds and today weighs 309 lbsBMI 46.98. Weight loss managed on Wegovy 2.4 mg weekly. Followed by [facility]. Does have hx of nausea/vomiting while taking this.  History of Hypothyroidism treated with Levothyroxine  75 mcg daily. 08/16/2023 TSH: 1.34.  History of Anxiety/Depression treated with Xanax  0.5 mg as needed and Pristiq 100 mg daily. Followed by Psychology.  History of Vitamin-D Deficiency treated with 1000 units daily. 08/04/2022  Vitamin-D: 54.  History of Migraine Headache treated with Sumatriptan  100 mg as needed, may repeat 2 hours if headache persists/recurs.  History of Obstructive Sleep Apnea treated with CPAP nightly.  History of Atrial Septal Defect diagnosed at [facility] in 2001, followed by Dr. Sharper Wonda, Cardiologist. 11/2022 Cardiac Morphology Study noted small ASD w/o significant shunting.  Labs 08/16/2023 CBC: WNL C-MET: WNL  Followed by Dr. Krystal Deaner, OBGYN, last PAP Smear 10/19/2022.  Mammogram 01/04/2023 normal with repeat recommendation of 2025.  Colonoscopy 11/10/2021 removed a 2 mm flat polyp from the cecum (per pathology tubular adenoma negative for high-grade dysplasia or malignancy); noted tortuous and redundant colon; did not see an evidence of ulcers visualized previously in 2022; exam otherwise normal with repeat recommendation of 2030.  Bone Density 06/27/2021  T-score at Total Left Femur was -03, normal.    Vaccine Counseling: Due for Influenza; UTD on Covid-19, Shingrix 2nd dose, PNA, and Tdap.  Review of Systems  Constitutional:  Negative for chills, fever, malaise/fatigue and weight loss.  HENT:  Negative for hearing loss, sinus pain and sore throat.   Respiratory:  Negative for cough, hemoptysis and shortness of breath.   Cardiovascular:  Negative for chest pain, palpitations, leg swelling and PND.  Gastrointestinal:  Negative for abdominal pain, constipation, diarrhea, heartburn, nausea and vomiting.  Genitourinary:  Negative for dysuria, frequency and urgency.  Musculoskeletal:  Negative for back pain, myalgias and neck pain.  Skin:  Negative for itching and rash.  Neurological:  Negative for dizziness, tingling, seizures and headaches.  Endo/Heme/Allergies:  Negative for polydipsia.  Psychiatric/Behavioral:  Negative for depression. The patient is not nervous/anxious.   All other systems reviewed and are negative.  Objective:  Vitals: body mass index is  46.98  kg/m. Today's Vitals   08/20/23 1100 08/20/23 1138  BP: 122/78   Pulse: 80   SpO2: 97%   Weight: (!) 309 lb (140.2 kg)   Height: 5' 8 (1.727 m)   PainSc: 0-No pain 0-No pain   Physical Exam Vitals and nursing note reviewed.  Constitutional:      General: She is not in acute distress.    Appearance: Normal appearance. She is not ill-appearing or toxic-appearing.  HENT:     Head: Normocephalic and atraumatic.     Right Ear: Hearing, tympanic membrane, ear canal and external ear normal.     Left Ear: Hearing, tympanic membrane, ear canal and external ear normal.     Mouth/Throat:     Pharynx: Oropharynx is clear.  Eyes:     Extraocular Movements: Extraocular movements intact.     Pupils: Pupils are equal, round, and reactive to light.  Neck:     Thyroid : No thyroid  mass, thyromegaly or thyroid  tenderness.     Vascular: No carotid bruit.  Cardiovascular:     Rate and Rhythm: Normal rate and regular rhythm. No extrasystoles are present.    Pulses:          Dorsalis pedis pulses are 2+ on the right side and 2+ on the left side.     Heart sounds: Normal heart sounds. No murmur heard.    No friction rub. No gallop.  Pulmonary:     Effort: Pulmonary effort is normal.     Breath sounds: Normal breath sounds. No decreased breath sounds, wheezing, rhonchi or rales.  Chest:     Chest wall: No mass.  Abdominal:     Palpations: Abdomen is soft. There is no hepatomegaly, splenomegaly or mass.     Tenderness: There is no abdominal tenderness.     Hernia: No hernia is present.  Musculoskeletal:     Cervical back: Normal range of motion.     Right lower leg: No edema.     Left lower leg: No edema.  Lymphadenopathy:     Cervical: No cervical adenopathy.     Upper Body:     Right upper body: No supraclavicular adenopathy.     Left upper body: No supraclavicular adenopathy.  Skin:    General: Skin is warm and dry.  Neurological:     General: No focal deficit present.      Mental Status: She is alert and oriented to person, place, and time. Mental status is at baseline.     Sensory: Sensation is intact.     Motor: Motor function is intact. No weakness.     Deep Tendon Reflexes: Reflexes are normal and symmetric.  Psychiatric:        Attention and Perception: Attention normal.        Mood and Affect: Mood normal.        Speech: Speech normal.        Behavior: Behavior normal.        Thought Content: Thought content normal.        Cognition and Memory: Cognition normal.        Judgment: Judgment normal.    Current Outpatient Medications  Medication Instructions   ALPRAZolam  (XANAX ) 0.5 mg, As needed   amLODipine  (NORVASC ) 5 mg, Oral, Daily   Aspirin  81 mg, Daily   atorvastatin  (LIPITOR) 20 MG tablet TAKE 1 TABLET BY MOUTH EVERYDAY AT BEDTIME   cetirizine (ZYRTEC) 10 mg, Daily   Cholecalciferol (VITAMIN D -3) 25 MCG (1000 UT) CAPS  Take by mouth.   CoQ-10 100 mg, Daily   Estradiol  Acetate (FEMRING) 0.05 MG/24HR RING 0.05 each, Every 3 months   levothyroxine  (SYNTHROID ) 75 mcg, Oral, Daily   Pristiq 100 mg, Daily   progesterone  (PROMETRIUM ) 100 mg, Daily   promethazine  (PHENERGAN ) 12.5 mg, Oral, Every 6 hours PRN   SUMAtriptan  (IMITREX ) 100 MG tablet May repeat in 2 hours if headache persists or recurs.   Wegovy 2.4 mg, Weekly   Past Medical History:  Diagnosis Date   Anxiety    controlled   Atrial septal defect    Small,non significant atrial septal defect with QP/QS of 1.25 by MRA.   Cancer [facility])    melanoma left leg 2012   Complication of anesthesia    gets migraine when she has to be NPO   Depression    Hyperlipidemia    Hypertension    Pt doesn't take meds and denies having BP issues however her diostolic in alway in high 80s -90s   Hypothyroidism    Migraine    occasionally   Migraine headache    Obesity    Obstructive apnea    CPAP user since 2012.    PONV (postoperative nausea and vomiting)    n/v from migraine, not anesthesia    Primary localized osteoarthritis of left knee 08/02/2018   osteo   Pseudotumor cerebri Diagnosed in March 2008-   She sees Dr Claryce in this regard   Sleep apnea    uses CPAP   Statin intolerance    with elevated CRP   Medical/Surgical History Narrative:  Allergic/Intolerant to:  Allergies  Allergen Reactions   Augmentin [Amoxicillin-Pot Clavulanate] Nausea And Vomiting and Other (See Comments)    Has patient had a PCN reaction causing immediate rash, facial/tongue/throat swelling, SOB or lightheadedness with hypotension: No Has patient had a PCN reaction causing severe rash involving mucus membranes or skin necrosis: No Has patient had a PCN reaction that required hospitalization: No Has patient had a PCN reaction occurring within the last 10 years: #  #  #  YES  #  #  #  > 2014 If all of the above answers are NO, then may proceed with Cephalosporin use.    2012 - Melanoma, Left Leg  2008 - Diagnosed with Pseudotumor Cerebri  Past Surgical History:  Procedure Laterality Date   BACK SURGERY     BIOPSY  07/04/2020   Procedure: BIOPSY;  Surgeon: Eda Iha, MD;  Location: [facility];  Service: Gastroenterology;;   BUNIONECTOMY     COLONOSCOPY WITH PROPOFOL  N/A 07/04/2020   Procedure: COLONOSCOPY WITH PROPOFOL ;  Surgeon: Eda Iha, MD;  Location: [facility];  Service: Gastroenterology;  Laterality: N/A;   COLONOSCOPY WITH PROPOFOL  N/A 11/10/2021   Procedure: COLONOSCOPY WITH PROPOFOL ;  Surgeon: Eda Iha, MD;  Location: [facility];  Service: Gastroenterology;  Laterality: N/A;   FRACTURE SURGERY     left 5th metatarsal   MASS EXCISION Left 03/22/2018   Procedure: EXCISION CYST DEBRIDEMENT DISTAL INTERPHALANGEAL LEFT MIDDLE FINGER;  Surgeon: Murrell Kuba, MD;  Location: [facility];  Service: Orthopedics;  Laterality: Left;  FAB   ORTHOPEDIC SURGERY     lumbar surgery    PARTIAL KNEE ARTHROPLASTY Right 09/15/2016   Procedure:  UNICOMPARTMENTAL KNEE;  Surgeon: Josefina Chew, MD;  Location: [facility];  Service: Orthopedics;  Laterality: Right;   PARTIAL KNEE ARTHROPLASTY Left 08/02/2018   Procedure: UNICOMPARTMENTAL KNEE;  Surgeon: Josefina Chew, MD;  Location: [facility];  Service: Orthopedics;  Laterality: Left;   pin in the fifth metatarsal     POLYPECTOMY  11/10/2021   Procedure: POLYPECTOMY;  Surgeon: Eda Iha, MD;  Location: [facility];  Service: Gastroenterology;;   right knee arthroscopy     WISDOM TOOTH EXTRACTION     Family History  Problem Relation Age of Onset   Alzheimer's disease Mother    Rheumatic fever Father    Colon cancer Neg Hx    Esophageal cancer Neg Hx    Stomach cancer Neg Hx    Colon polyps Neg Hx    Rectal cancer Neg Hx    Family History Narrative: No Family History of Esophageal/Stomach/Rectal Cancer, Colon Polyps, Uterine/Endometrial Cancer, or Pancreatic Cancer Father, deceased age 61 w/ hx of Stroke and Congestive Heart Failure According to Dr. Vinnie records: Maternal Aunt w/ hx of Malignant Tumor of Breast and Colon. 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 She was referred for evaluation of what was previously been diagnosed as an ASD in 2001 at [facility]. She was advised to have it followed medically. She is marrried and lives with her spouse . She may have two glasses  of wine a week. She does not use tobacco. She sleeps with a CPAP.   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 Enjoys traveling.   Most Recent Social Determinants of Health (Including Hx of Tobacco, Alcohol, and Drug Use) SDOH Screenings   Food Insecurity: No Food Insecurity (08/20/2023)  Housing: Low Risk   (08/20/2023)  Transportation Needs: No Transportation Needs (08/20/2023)  Utilities: Not At Risk (08/20/2023)  Alcohol Screen: Low Risk  (08/20/2023)  Depression (PHQ2-9): Low Risk  (08/20/2023)  Financial Resource Strain: Low Risk  (08/20/2023)  Physical Activity: Insufficiently Active (08/20/2023)  Social Connections: Socially Integrated (08/20/2023)  Stress: No Stress Concern Present (08/20/2023)  Tobacco Use: Low Risk  (08/20/2023)  Health Literacy: Adequate Health Literacy (08/20/2023)   Social History   Tobacco Use   Smoking status: Never   Smokeless tobacco: Never  Vaping Use   Vaping status: Never Used  Substance Use Topics   Alcohol use: Not Currently    Alcohol/week: 0.0 - 3.0 standard drinks of alcohol    Comment: occasionally   Drug use: No   Most Recent Functional Status Assessment:    08/20/2023   10:51 AM  In your present state of health, do you have any difficulty performing the following activities:  Hearing? 0  Vision? 0  Difficulty concentrating or making decisions? 0  Walking or climbing stairs? 0  Dressing or bathing? 0  Doing errands, shopping? 0  Preparing Food and eating ? N  Using the Toilet? N  In the past six months, have you accidently leaked urine? N  Do you have problems with loss of bowel control? N  Managing your Medications? N  Managing your Finances? N  Housekeeping or managing your Housekeeping? N   Most Recent Fall Risk Assessment:    08/20/2023   10:52 AM  Fall Risk   Falls in the past year? 0  Number falls in past yr: 0  Injury with Fall? 0  Risk for fall due to : No Fall Risks  Follow up Education  provided;Falls prevention discussed;Falls evaluation completed   Most Recent Anxiety/Depression Screenings:    08/20/2023   11:39 AM 08/10/2022   11:00 AM  PHQ 2/9 Scores  PHQ - 2 Score 0 0   Most Recent Cognitive Screening:    08/20/2023   10:51 AM  6CIT Screen  What Year? 0 points  What month? 0 points  What time? 0 points  Count back from 20  0 points  Months in reverse 0 points  Repeat phrase 0 points  Total Score 0 points   Most Recent Vision/Hearing Screenings: No results found. Results:  Studies Obtained And Personally Reviewed By Me:  CARDIAC MRI 11/23/2022 14:00  CLINICAL DATA:  Secundum ASD   FINDINGS: Left ventricle:   -Mild hypertrophy   -Normal size   -Normal systolic function   -Normal ECV (23%) and T2 values   -No LGE   LV EF:  60% (Normal 52-79%)   Absolute volumes:   LV EDV: (Normal 78-167 mL)   LV ESV: 67mL (Normal 21-64 mL)   LV SV: 99mL (Normal 52-114 mL)   CO: 8.2L/min (Normal 2.7-6.3 L/min)   Indexed volumes:   LV EDV: 58mL/sq-m (Normal 50-96 mL/sq-m)   LV ESV: 12mL/sq-m (Normal 10-40 mL/sq-m)   LV SV: 71mL/sq-m (Normal 33-64 mL/sq-m)   CI: 3.1L/min/sq-m (Normal 1.9-3.9 L/min/sq-m)   Right ventricle: Normal size and systolic function   RV EF: 59% (Normal 52-80%)   Absolute volumes:   RV EDV: (Normal 79-175 mL)   RV ESV: 76mL (Normal 13-75 mL)   RV SV: (Normal 56-110 mL)   CO: 9.2L/min (Normal 2.7-6 L/min)   Indexed volumes:   RV EDV: 74mL/sq-m (Normal 51-97 mL/sq-m)   RV ESV: 41mL/sq-m (Normal 9-42 mL/sq-m)   RV SV: 10mL/sq-m (Normal 35-61 mL/sq-m)   CI: 3.5L/min/sq-m (Normal 1.8-3.8 L/min/sq-m)   Left atrium: Normal size.  Secundum ASD.  Qp/Qs is 1.1   Right atrium: Normal size   Mitral valve: Trivial regurgitation   Aortic valve: Tricuspid.  Trivial regurgitation   Tricuspid valve: Trivial regurgitation   Pulmonic valve: Trivial regurgitation   Aorta: Normal proximal ascending aorta   Pericardium: Normal   IMPRESSION: 1. Secundum ASD. Qp/Qs 1.1 and normal RV size suggests small ASD without significant shunting   2. Normal LV size, mild hypertrophy, and normal systolic function (EF 60%)   3.  Normal RV size and systolic function (EF 59%)   4.  No late gadolinium enhancement to suggest myocardial scar   2020 Coronary  Calcium  Score: 0.  Mammogram 01/04/2023 normal.  Colonoscopy 11/10/2021 removed a 2 mm flat polyp from the cecum (per pathology tubular adenoma negative for high-grade dysplasia or malignancy); noted tortuous and redundant colon; did not see an evidence of ulcers visualized previously in 2022; exam otherwise normal.  Bone Density 06/27/2021  T-score at Total Left Femur was -03, normal.    Labs:  CBC w/ Differential Lab Results  Component Value Date   WBC 6.1 08/16/2023   RBC 4.53 08/16/2023   HGB 12.8 08/16/2023   HCT 40.0 08/16/2023   PLT 203 08/16/2023   MCV 88.3 08/16/2023   MCH 28.3 08/16/2023   MCHC 32.0 08/16/2023   RDW 13.9 08/16/2023   MPV 10.8 08/16/2023   LYMPHSABS 1,966 08/04/2022   MONOABS 0.4 07/13/2022   BASOSABS 49 08/16/2023    Comprehensive Metabolic Panel Lab Results  Component Value Date   NA 141 08/16/2023   K 4.3 08/16/2023   CL 105 08/16/2023  CO2 27 08/16/2023   GLUCOSE 89 08/16/2023   BUN 19 08/16/2023   CREATININE 0.79 08/16/2023   CALCIUM  9.0 08/16/2023   PROT 6.4 08/16/2023   ALBUMIN 4.6 07/13/2022   AST 20 08/16/2023   ALT 17 08/16/2023   ALKPHOS 81 07/13/2022   BILITOT 0.3 08/16/2023   EGFR 68 02/09/2023   GFRNONAA >60 07/13/2022   Lipid Panel  Lab Results  Component Value Date   CHOL 129 08/16/2023   HDL 39 (L) 08/16/2023   LDLCALC 65 08/16/2023   TRIG 176 (H) 08/16/2023   A1c Lab Results  Component Value Date   HGBA1C 5.5 08/16/2023    TSH Lab Results  Component Value Date   TSH 1.34 08/16/2023   Assessment & Plan:   Meds ordered this encounter  Medications   SUMAtriptan  (IMITREX ) 100 MG tablet    Sig: May repeat in 2 hours if headache persists or recurs.    Dispense:  27 tablet    Refill:  2  Other Labs Reviewed today: CBC: WNL C-MET: WNL  Hypertension treated with Amlodipine  5 mg daily. Blood Pressure: normotensive today at 122/78.  Hyperlipidemia treated with Atorvastatin  20 mg at bedtime. 08/16/2023 Lipid  Panel, compared to 07/2022: HDL 39, decreased from 48; Triglycerides 176, elevated from 129; otherwise WNL. 2020 Coronary Calcium  Score: 0.  Obesity; BMI 45+: since 01/2023, when she weighed 330 lbs, she has lost 21 pounds and today weighs 309 lbsBMI 46.98. Weight loss managed on Wegovy 2.4 mg weekly. Followed by [facility]. Does have hx of nausea/vomiting while taking this.  Hypothyroidism treated with Levothyroxine  75 mcg daily. 08/16/2023 TSH: 1.34.  Anxiety/Depression treated with Xanax  0.5 mg as needed and Pristiq 100 mg daily. Followed by Psychology.  Vitamin-D Deficiency treated with 1000 units daily. 08/04/2022 Vitamin-D: 54.  Migraine Headache treated with Sumatriptan  100 mg as needed, may repeat 2 hours if headache persists/recurs.  Refilled Sumatriptan  100 mg today,   Obstructive Sleep Apnea treated with CPAP nightly.  Atrial Septal Defect followed by Dr. Ozell Fell, Cardiologist. 11/2022 Cardiac Morphology Study noted small ASD w/o significant shunting.  Followed by Dr. Krystal Deaner, OBGYN, last PAP Smear 10/19/2022.  Mammogram 01/04/2023 normal with repeat recommendation of 2025.  Colonoscopy 11/10/2021 removed a 2 mm flat polyp from the cecum (per pathology tubular adenoma negative for high-grade dysplasia or malignancy); noted tortuous and redundant colon; did not see an evidence of ulcers visualized previously in 2022; exam otherwise normal with repeat recommendation of 2030.  Bone Density 06/27/2021  T-score at Total Left Femur was -03, normal.    Vaccine Counseling: Due for Influenza; UTD on Covid-19, Shingrix 2nd dose, PNA, and Tdap.  Return in 1 year (on 08/21/2024) for annual labs, and then on 08/25/2024 for annual visit, or as needed.   Annual Wellness Visit done today including the all of the following: Reviewed patient's Family Medical History Reviewed patient's SDOH and reviewed tobacco, alcohol, and drug use.  Reviewed and updated list of patient's  medical providers Assessment of cognitive impairment was done Assessed patient's functional ability Established a written schedule for health screening services Health Risk Assessent Completed and Reviewed  Discussed health benefits of physical activity, and encouraged her to engage in regular exercise appropriate for her age and condition.   I,Emily Lagle,acting as a Neurosurgeon for Ronal JINNY Hailstone, MD.,have documented all relevant documentation on the behalf of Ronal JINNY Hailstone, MD,as directed by  Ronal JINNY Hailstone, MD while in the presence of Ronal JINNY Hailstone,  MD.   LILLETTE Ronal JINNY Perri, MD, have reviewed all documentation for this visit. The documentation on 08/20/2023 for the exam, diagnosis, procedures, and orders are all accurate and complete.

## 2023-08-23 ENCOUNTER — Encounter: Payer: Self-pay | Admitting: Internal Medicine

## 2023-08-25 ENCOUNTER — Encounter: Payer: Self-pay | Admitting: Internal Medicine

## 2023-08-25 NOTE — Patient Instructions (Addendum)
 It was a pleasure to see you today. Please continue current meds and continue follow up with Dr. Wonda, Cardiology. Have refilled migraine headache medication. Continue thyroid  replacement medication. Coneinue Xanax  and Pristiq for anxiety/depression. Continue weight loss efforts.Continue atorvastatin  and amlodipine .Defer GYN care to Dr. Horacio. We will plan to see you in one year or as needed.

## 2023-08-31 DIAGNOSIS — I10 Essential (primary) hypertension: Secondary | ICD-10-CM | POA: Diagnosis not present

## 2023-08-31 DIAGNOSIS — E65 Localized adiposity: Secondary | ICD-10-CM | POA: Diagnosis not present

## 2023-08-31 DIAGNOSIS — G4733 Obstructive sleep apnea (adult) (pediatric): Secondary | ICD-10-CM | POA: Diagnosis not present

## 2023-08-31 DIAGNOSIS — Z6841 Body Mass Index (BMI) 40.0 and over, adult: Secondary | ICD-10-CM | POA: Diagnosis not present

## 2023-08-31 DIAGNOSIS — K5909 Other constipation: Secondary | ICD-10-CM | POA: Diagnosis not present

## 2023-08-31 DIAGNOSIS — E66813 Obesity, class 3: Secondary | ICD-10-CM | POA: Diagnosis not present

## 2023-09-13 DIAGNOSIS — K5909 Other constipation: Secondary | ICD-10-CM | POA: Diagnosis not present

## 2023-09-13 DIAGNOSIS — E66813 Obesity, class 3: Secondary | ICD-10-CM | POA: Diagnosis not present

## 2023-09-13 DIAGNOSIS — I10 Essential (primary) hypertension: Secondary | ICD-10-CM | POA: Diagnosis not present

## 2023-09-13 DIAGNOSIS — E65 Localized adiposity: Secondary | ICD-10-CM | POA: Diagnosis not present

## 2023-09-13 DIAGNOSIS — Z6841 Body Mass Index (BMI) 40.0 and over, adult: Secondary | ICD-10-CM | POA: Diagnosis not present

## 2023-09-13 DIAGNOSIS — G4733 Obstructive sleep apnea (adult) (pediatric): Secondary | ICD-10-CM | POA: Diagnosis not present

## 2023-09-15 ENCOUNTER — Other Ambulatory Visit: Payer: Self-pay | Admitting: Cardiovascular Disease

## 2023-09-28 DIAGNOSIS — G4733 Obstructive sleep apnea (adult) (pediatric): Secondary | ICD-10-CM | POA: Diagnosis not present

## 2023-09-28 DIAGNOSIS — E66813 Obesity, class 3: Secondary | ICD-10-CM | POA: Diagnosis not present

## 2023-09-28 DIAGNOSIS — E65 Localized adiposity: Secondary | ICD-10-CM | POA: Diagnosis not present

## 2023-09-28 DIAGNOSIS — I10 Essential (primary) hypertension: Secondary | ICD-10-CM | POA: Diagnosis not present

## 2023-09-28 DIAGNOSIS — K5909 Other constipation: Secondary | ICD-10-CM | POA: Diagnosis not present

## 2023-09-28 DIAGNOSIS — Z6841 Body Mass Index (BMI) 40.0 and over, adult: Secondary | ICD-10-CM | POA: Diagnosis not present

## 2023-09-29 ENCOUNTER — Telehealth: Payer: Self-pay | Admitting: Internal Medicine

## 2023-09-29 MED ORDER — LEVOTHYROXINE SODIUM 75 MCG PO TABS: 75.0000 ug | ORAL_TABLET | Freq: Every day | ORAL | 3 refills | Status: AC

## 2023-09-29 NOTE — Telephone Encounter (Signed)
rX SENT

## 2023-10-11 DIAGNOSIS — G4733 Obstructive sleep apnea (adult) (pediatric): Secondary | ICD-10-CM | POA: Diagnosis not present

## 2023-10-11 DIAGNOSIS — I10 Essential (primary) hypertension: Secondary | ICD-10-CM | POA: Diagnosis not present

## 2023-10-11 DIAGNOSIS — E65 Localized adiposity: Secondary | ICD-10-CM | POA: Diagnosis not present

## 2023-10-11 DIAGNOSIS — Z6841 Body Mass Index (BMI) 40.0 and over, adult: Secondary | ICD-10-CM | POA: Diagnosis not present

## 2023-10-11 DIAGNOSIS — K5909 Other constipation: Secondary | ICD-10-CM | POA: Diagnosis not present

## 2023-10-11 DIAGNOSIS — E66813 Obesity, class 3: Secondary | ICD-10-CM | POA: Diagnosis not present

## 2023-10-25 DIAGNOSIS — K5909 Other constipation: Secondary | ICD-10-CM | POA: Diagnosis not present

## 2023-10-25 DIAGNOSIS — E65 Localized adiposity: Secondary | ICD-10-CM | POA: Diagnosis not present

## 2023-10-25 DIAGNOSIS — E66813 Obesity, class 3: Secondary | ICD-10-CM | POA: Diagnosis not present

## 2023-10-25 DIAGNOSIS — I10 Essential (primary) hypertension: Secondary | ICD-10-CM | POA: Diagnosis not present

## 2023-10-25 DIAGNOSIS — G4733 Obstructive sleep apnea (adult) (pediatric): Secondary | ICD-10-CM | POA: Diagnosis not present

## 2023-10-25 DIAGNOSIS — Z6841 Body Mass Index (BMI) 40.0 and over, adult: Secondary | ICD-10-CM | POA: Diagnosis not present

## 2023-11-08 DIAGNOSIS — K5909 Other constipation: Secondary | ICD-10-CM | POA: Diagnosis not present

## 2023-11-08 DIAGNOSIS — I10 Essential (primary) hypertension: Secondary | ICD-10-CM | POA: Diagnosis not present

## 2023-11-08 DIAGNOSIS — E66813 Obesity, class 3: Secondary | ICD-10-CM | POA: Diagnosis not present

## 2023-11-08 DIAGNOSIS — Z6841 Body Mass Index (BMI) 40.0 and over, adult: Secondary | ICD-10-CM | POA: Diagnosis not present

## 2023-11-08 DIAGNOSIS — E65 Localized adiposity: Secondary | ICD-10-CM | POA: Diagnosis not present

## 2023-11-08 DIAGNOSIS — G4733 Obstructive sleep apnea (adult) (pediatric): Secondary | ICD-10-CM | POA: Diagnosis not present

## 2023-11-24 DIAGNOSIS — G4733 Obstructive sleep apnea (adult) (pediatric): Secondary | ICD-10-CM | POA: Diagnosis not present

## 2023-11-24 DIAGNOSIS — E66813 Obesity, class 3: Secondary | ICD-10-CM | POA: Diagnosis not present

## 2023-11-24 DIAGNOSIS — Z6841 Body Mass Index (BMI) 40.0 and over, adult: Secondary | ICD-10-CM | POA: Diagnosis not present

## 2023-11-24 DIAGNOSIS — E65 Localized adiposity: Secondary | ICD-10-CM | POA: Diagnosis not present

## 2023-11-24 DIAGNOSIS — F439 Reaction to severe stress, unspecified: Secondary | ICD-10-CM | POA: Diagnosis not present

## 2023-11-24 DIAGNOSIS — I10 Essential (primary) hypertension: Secondary | ICD-10-CM | POA: Diagnosis not present

## 2023-11-24 DIAGNOSIS — K5909 Other constipation: Secondary | ICD-10-CM | POA: Diagnosis not present

## 2023-12-06 NOTE — Patient Instructions (Incomplete)

## 2023-12-06 NOTE — Progress Notes (Deleted)
 PATIENT: Diana [name] DOB: 12-Oct-1955  REASON FOR VISIT: follow up HISTORY FROM: patient  No chief complaint on file.    HISTORY OF PRESENT ILLNESS:  12/13/2023 ALL:  Elveta returns for follow up for OSA on CPAP.     12/07/2022 ALL:  Dixie returns for follow up for OSA on CPAP. She was last seen 08/2021 and we repeated HST that showed AHI 24/h and O2 nadir 82%. She reports that DME could not tell her what coverage she would have on new machine. She opted to purchase a travel machine out of pocket. She uses her travel machine, most days. She does travel often and really likes the convenience of the smaller machine. She admits that she does not change out supplies as often as she could. She is using a nasal mask.     08/20/2021 ALL: Yena returns for follow up for OSA on CPAP. She continues to do very well. She reports not being able to sleep without her machine. She is alternating between two machines. She continues to travel for work. She would like to get a new CPAP. May consider travel machine in the future.       08/20/2020 ALL: Davanee returns for follow up for OSA on CPAP. She continues to do very well on therapy. She is using her machine or her travel machine every night. She denies concerns with CPAP or supplies.   Compliance report dated 07/21/2020-8/02-2020 combining her home CPAP and travel information shows that she used CPAP every night for greater than 4 hours. Average usage was about 8 hours. Residual AHI was 0.7 on 8-15cmH20. No significant leak noted.   07/20/2019 ALL:  Ashleymarie Granderson is a 68 y.o. female here today for follow up for OSA on CPAP.  She is doing very well with CPAP therapy.  She uses her machine every night.  She denies any concerns or issues with her machine.  Compliance report dated 06/19/2019 through 07/18/2019 reveals that she used CPAP every day for compliance of 100%.  She used CPAP greater than 4 hours every day.  Average usage was 7 hours and  13 minutes.  Residual AHI was 0.7 on 8 to 15 cm of water  and an EPR of 1.  There was no significant leak noted.  She is feeling well today and without concerns.  HISTORY: (copied from my note on 07/04/2018)  SHAYONNA OCAMPO is a 68 y.o. female here today for follow up of OSA on CPAP.  She is doing very well with CPAP therapy.  She is using her machine every night.  She states that she cannot sleep without it.  She is in need of new supplies.   06/04/2018 - 07/03/2018 07/03/2018 Usage days 30/30 days (100%) >= 4 hours 30 days (100%) < 4 hours 0 days (0%) Usage hours 231 hours 31 minutes Average usage (total days) 7 hours 43 minutes Average usage (days used) 7 hours 43 minutes Median usage (days used) 7 hours 43 minutes Total used hours (value since last reset - 07/03/2018) 7,298 hours AirSense 10 AutoSet Serial number [id number] Mode AutoSet Min Pressure 8 cmH2O Max Pressure 15 cmH2O EPR Fulltime EPR level 1 Response Standard Therapy Pressure - cmH2O Median: 9.0 95th percentile: 11.4 Maximum: 12.9 Leaks - L/min Median: 1.0 95th percentile: 13.2 Maximum: 21.4 Events per hour AI: 1.3 HI: 0.0 AHI: 1.3 Apnea Index Central: 0.1 Obstructive: 1.2 Unknown: 0.0 RERA Index 0.0 Cheyne-Stokes respiration (average duration per night) 0 minutes (0%)  History (copied from Dr Dohmeier's note on 04/08/2017)   HPI: LAKE BREEDING is a 68  y.o. female seen here at [facility] as a referral from Dr. Blinda. She used to follow Dr. Maurice, who signed her CPAP orders, but she was never scheduled for a formal sleep evaluation.  Mrs. Labo underwent a split study on 9-28 2012. She has seen me yearly since then and has an AutoSet CPAP machine. Her sleep habits have not changed. She still not taking naps, she is not a shift worker, she usually goes to bed between 10 and 11 PM and rises at 6.00 in the morning, Most of the nights she will need an alarm to wake up in the morning. She will be received fresh and  restored after a nocturnal sleep of about 7-7-1/2 hours.    07-29-12 for a follow up  visit : The patient used the auto titrator between . Average daily usage was 7 hours and 42 minutes, which is a very good result. Residual apnea was 0.9 or. She had very minor air leaks and her 95th percentile pressure on CPAP was 10 cm the window or for this Ultracet was between 7 and 15 cm ater she endorsed the Epworth sleepiness score it only took points today. I would like for her to have the machine set at 10 cm water  with a peak TR function. The patient has always been compliant with the CPAP but this is in [place] at the CPAP actually has reduced her apnea to a single-digit orbital single-digit level. She's 100% compliant. She had been placed on an auto-titrator before the SPLIT study and slept well with it. She tried changing to a different sleep setting of 9 cm water , and resumed afterwards her old autoset CPAP machine.  The patient never had a tonsillectomy. During her youth on [place] used a mining engineer for retrognathia. She has no TMJ. She has a deformed anterior chest wall, Her estimated nocturnal sleep time about 7 hours.  She  does not complain of nocturia, as no breakthrough snoring or witnessed apneas.  The patient's on the cardiology history is a patent foramen ovale, she has or a body weight of 239 pounds at 5 feet 8 inches. Since the last sleep study she first gained over 60 pounds and lost already 25. College years the patient has never worked shift work again., She does not take daytime naps. The patient is using her 68 year old autotitrator machine. She received a new one after her sleep study end of 2012 but was unable to tolerate it.  Fam. history: Her father has a history of excessive daytime sleepiness loud snoring and she witnessed several times apneas , also he was never formally diagnosed. He lived to be 95.    09-11-2015 Mrs. Tim presents today brokenhearted as her CPAP machine  has a tight. He has used a AutoSet advantage the S8 machine for over 9 years. She has been a very compliant CPAP user with 100% compliance up to early August 2017 average user time of 7 hours 11 minutes, AutoSet is allowing pressures between 10 and 15 cm water  was 1 cm EPR and the residual AHI was 1.2. Air leaks were very mild 91st percentile pressure was 12 cm water . I will sign a prescription and [facility] to [facility] CPAP machine I would like her to have an air sense machine with an O2 sat between 8 and 15 cm water  and 1 cm  EPR. I will ask Prentice Saba to set the machine for her. She endorsed today the Epworth sleepiness score at 2 points, she does not endorse a high fatigue, she is not clinically depressed. She just would like to start CPAP.    Interval history on 04/02/2016, Mrs. Tim is here for her regular CPAP compliance visits and she has done excellent. 100% compliant 7 hours and 3 minutes average user time for the last 30 days, she's using an AutoSet between 8 and 15 cm water , and the 95th percentile pressure is 13.1 cm at night. The residual apneas is 0.6 per hour. Her husband believes that he heard some breakthrough snoring at night. 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    Interval history from 08 April 2017, I have the pleasure of meeting today with kerline trahan, a 68 year old Caucasian CPAP user ,with a 93% compliance for the last 30 days.  Average use of time 6 hours and 55 minutes on CPAP nightly, the machine is an AutoSet between 8 and 15 cmH2O pressure was 1 cm expiratory pressure relief.  Residual AHI is 0.7, 95th percentile pressure is 10.8, there are no central apneas emerging, there is no high air leak.  The patient has benefited from CPAP therapy as her Epworth sleepiness score is endorsed at only 2 points and her fatigue severity at 10 points. She has noted how bad she felt when the power failed and she was without CPAP. She has a  second, older CPAP machine at her second residence -    REVIEW OF SYSTEMS: Out of a complete 14 system review of symptoms, the patient complains only of the following symptoms, none and all other reviewed systems are negative.  ESS: 2/24, previously 3/24  ALLERGIES: Allergies  Allergen Reactions   Augmentin [Amoxicillin-Pot Clavulanate] Nausea And Vomiting and Other (See Comments)    Has patient had a PCN reaction causing immediate rash, facial/tongue/throat swelling, SOB or lightheadedness with hypotension: No Has patient had a PCN reaction causing severe rash involving mucus membranes or skin necrosis: No Has patient had a PCN reaction that required hospitalization: No Has patient had a PCN reaction occurring within the last 10 years: #  #  #  YES  #  #  #  > 2014 If all of the above answers are NO, then may proceed with Cephalosporin use.     HOME MEDICATIONS: Outpatient Medications Prior to Visit  Medication Sig Dispense Refill   ALPRAZolam  (XANAX ) 0.5 MG tablet Take 0.5 mg by mouth as needed for anxiety.  0   amLODipine  (NORVASC ) 5 MG tablet TAKE 1 TABLET (5 MG TOTAL) BY MOUTH DAILY. 90 tablet 1   Aspirin  81 MG CAPS Take 81 mg by mouth daily.     atorvastatin  (LIPITOR) 20 MG tablet TAKE 1 TABLET BY MOUTH EVERYDAY AT BEDTIME 90 tablet 0   cetirizine (ZYRTEC) 10 MG tablet Take 10 mg by mouth daily.     Cholecalciferol (VITAMIN D -3) 25 MCG (1000 UT) CAPS Take by mouth.     Coenzyme Q10 (COQ-10) 100 MG CAPS Take 100 mg by mouth daily.     Estradiol  Acetate (FEMRING) 0.05 MG/24HR RING Place 0.05 each vaginally every 3 (three) months.     levothyroxine  (SYNTHROID ) 75 MCG tablet Take 1 tablet (75 mcg total) by mouth daily. 90 tablet 3   PRISTIQ 100 MG 24 hr tablet Take 100 mg by mouth daily.  1   progesterone  (PROMETRIUM ) 100 MG capsule  Take 100 mg by mouth daily.     promethazine  (PHENERGAN ) 12.5 MG tablet Take 1 tablet (12.5 mg total) by mouth every 6 (six) hours as needed for  nausea or vomiting. 15 tablet 0   Semaglutide-Weight Management (WEGOVY) 2.4 MG/0.75ML SOAJ Inject 2.4 mg into the skin once a week.     SUMAtriptan  (IMITREX ) 100 MG tablet May repeat in 2 hours if headache persists or recurs. 27 tablet 2   No facility-administered medications prior to visit.    PAST MEDICAL HISTORY: Past Medical History:  Diagnosis Date   Anxiety    controlled   Atrial septal defect    Small,non significant atrial septal defect with QP/QS of 1.25 by MRA.   Cancer [facility])    melanoma left leg 2012   Complication of anesthesia    gets migraine when she has to be NPO   Depression    Hyperlipidemia    Hypertension    Pt doesn't take meds and denies having BP issues however her diostolic in alway in high 80s -90s   Hypothyroidism    Migraine    occasionally   Migraine headache    Obesity    Obstructive apnea    CPAP user since 2012.    PONV (postoperative nausea and vomiting)    n/v from migraine, not anesthesia   Primary localized osteoarthritis of left knee 08/02/2018   osteo   Pseudotumor cerebri Diagnosed in March 2008-   She sees Dr Claryce in this regard   Sleep apnea    uses CPAP   Statin intolerance    with elevated CRP    PAST SURGICAL HISTORY: Past Surgical History:  Procedure Laterality Date   BACK SURGERY     BIOPSY  07/04/2020   Procedure: BIOPSY;  Surgeon: Eda Iha, MD;  Location: [facility];  Service: Gastroenterology;;   ROMAYNE     COLONOSCOPY WITH PROPOFOL  N/A 07/04/2020   Procedure: COLONOSCOPY WITH PROPOFOL ;  Surgeon: Eda Iha, MD;  Location: [facility];  Service: Gastroenterology;  Laterality: N/A;   COLONOSCOPY WITH PROPOFOL  N/A 11/10/2021   Procedure: COLONOSCOPY WITH PROPOFOL ;  Surgeon: Eda Iha, MD;  Location: [facility];  Service: Gastroenterology;  Laterality: N/A;   FRACTURE SURGERY     left 5th metatarsal   MASS EXCISION Left 03/22/2018   Procedure: EXCISION CYST DEBRIDEMENT DISTAL  INTERPHALANGEAL LEFT MIDDLE FINGER;  Surgeon: Murrell Kuba, MD;  Location: [facility];  Service: Orthopedics;  Laterality: Left;  FAB   ORTHOPEDIC SURGERY     lumbar surgery    PARTIAL KNEE ARTHROPLASTY Right 09/15/2016   Procedure: UNICOMPARTMENTAL KNEE;  Surgeon: Josefina Chew, MD;  Location: [facility];  Service: Orthopedics;  Laterality: Right;   PARTIAL KNEE ARTHROPLASTY Left 08/02/2018   Procedure: UNICOMPARTMENTAL KNEE;  Surgeon: Josefina Chew, MD;  Location: [facility];  Service: Orthopedics;  Laterality: Left;   pin in the fifth metatarsal     POLYPECTOMY  11/10/2021   Procedure: POLYPECTOMY;  Surgeon: Eda Iha, MD;  Location: [facility];  Service: Gastroenterology;;   right knee arthroscopy     WISDOM TOOTH EXTRACTION      FAMILY HISTORY: Family History  Problem Relation Age of Onset   Alzheimer's disease Mother    Rheumatic fever Father    Colon cancer Neg Hx    Esophageal cancer Neg Hx    Stomach cancer Neg Hx    Colon polyps Neg Hx    Rectal cancer Neg Hx     SOCIAL HISTORY: Social  History   Socioeconomic History   Marital status: Married    Spouse name: Lynwood   Number of children: 0   Years of education: masters   Highest education level: Master's degree (e.g., MA, MS, MEng, MEd, MSW, MBA)  Occupational History    Employer: [employer]    Comment: [employer]  Tobacco Use   Smoking status: Never   Smokeless tobacco: Never  Vaping Use   Vaping status: Never Used  Substance and Sexual Activity   Alcohol use: Not Currently    Alcohol/week: 0.0 - 3.0 standard drinks of alcohol    Comment: occasionally   Drug use: No   Sexual activity: Not Currently  Other Topics Concern   Not on file  Social History Narrative   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 She was referred for evaluation of what was previously been diagnosed as an ASD in 2001 at [facility]. She was advised to have it followed  medically. She is marrried and lives with her spouse . She may have two glasses  of wine a week. She does not use tobacco. She sleeps with a CPAP.   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 Enjoys traveling.   Social Drivers of Corporate Investment Banker Strain: Low Risk  (08/20/2023)   Overall Financial Resource Strain (CARDIA)    Difficulty of Paying Living Expenses: Not very hard  Food Insecurity: No Food Insecurity (08/20/2023)   Hunger Vital Sign    Worried About Running Out of Food in the Last Year: Never true    Ran Out of Food in the Last Year: Never true  Transportation Needs: No Transportation Needs (08/20/2023)   PRAPARE - Administrator, Civil Service (Medical): No    Lack of Transportation (Non-Medical): No  Physical Activity: Insufficiently Active (08/20/2023)   Exercise Vital Sign    Days of Exercise per Week: 2 days    Minutes of Exercise per Session: 40 min  Stress: No Stress Concern Present (08/20/2023)   [employer] of Occupational Health - Occupational Stress Questionnaire    Feeling of Stress: Not at all  Social Connections: Socially Integrated (08/20/2023)   Social Connection and Isolation Panel    Frequency of Communication with Friends and Family: More than three times a week    Frequency of Social Gatherings with Friends and Family: Twice a week    Attends Religious Services: More than 4 times per year    Active Member of [employer] or Organizations: Yes    Attends Engineer, Structural: More than 4 times per year    Marital Status: Married  Catering Manager Violence: Not At Risk (08/20/2023)   Humiliation, Afraid, Rape, and Kick questionnaire    Fear of Current or Ex-Partner: No    Emotionally Abused: No    Physically Abused: No    Sexually Abused: No      PHYSICAL EXAM  There were no vitals filed for this visit.    There is no height or weight on  file to calculate BMI.  Generalized: Well developed, in no acute distress  Neck circumference 16 Cardiology: normal rate and rhythm, no murmur noted Respiratory: clear to auscultation bilaterally  Neurological examination  Mentation: Alert oriented to time, place, history taking. Follows all commands speech and language fluent Cranial nerve II-XII: Pupils were equal round reactive  to light. Extraocular movements were full, visual field were full  Motor: The motor testing reveals 5 over 5 strength of all 4 extremities. Good symmetric motor tone is noted throughout.  Gait and station: Gait is normal.   DIAGNOSTIC DATA (LABS, IMAGING, TESTING) - I reviewed patient records, labs, notes, testing and imaging myself where available.      No data to display           Lab Results  Component Value Date   WBC 6.1 08/16/2023   HGB 12.8 08/16/2023   HCT 40.0 08/16/2023   MCV 88.3 08/16/2023   PLT 203 08/16/2023      Component Value Date/Time   NA 141 08/16/2023 0942   K 4.3 08/16/2023 0942   CL 105 08/16/2023 0942   CO2 27 08/16/2023 0942   GLUCOSE 89 08/16/2023 0942   BUN 19 08/16/2023 0942   CREATININE 0.79 08/16/2023 0942   CALCIUM  9.0 08/16/2023 0942   PROT 6.4 08/16/2023 0942   ALBUMIN 4.6 07/13/2022 1118   AST 20 08/16/2023 0942   ALT 17 08/16/2023 0942   ALKPHOS 81 07/13/2022 1118   BILITOT 0.3 08/16/2023 0942   GFRNONAA >60 07/13/2022 1118   GFRNONAA 64 11/30/2019 0927   GFRAA 74 11/30/2019 0927   Lab Results  Component Value Date   CHOL 129 08/16/2023   HDL 39 (L) 08/16/2023   LDLCALC 65 08/16/2023   TRIG 176 (H) 08/16/2023   CHOLHDL 3.3 08/16/2023   Lab Results  Component Value Date   HGBA1C 5.5 08/16/2023   No results found for: VITAMINB12 Lab Results  Component Value Date   TSH 1.34 08/16/2023      ASSESSMENT AND PLAN 68 y.o. year old female  has a past medical history of Anxiety, Atrial septal defect, Cancer (HCC), Complication of anesthesia,  Depression, Hyperlipidemia, Hypertension, Hypothyroidism, Migraine, Migraine headache, Obesity, Obstructive apnea, PONV (postoperative nausea and vomiting), Primary localized osteoarthritis of left knee (08/02/2018), Pseudotumor cerebri (Diagnosed in March 2008-), Sleep apnea, and Statin intolerance. here with   No diagnosis found.    Quinette is doing very well on CPAP therapy. Now using a travel machine most every night. Compliance report reveals excellent compliance.  I have encouraged her to continue using CPAP nightly and for greater than 4 hours each night.  We will update supply orders and sent to DME. Healthy lifestyle habits encouraged.  She will continue close follow-up with primary care.  She will follow-up with me in 1 year.  She verbalizes understanding and agreement with this plan.   No orders of the defined types were placed in this encounter.     No orders of the defined types were placed in this encounter.      Greig Forbes, FNP-C 12/06/2023, 10:06 AM [facility] [address] [place], [place] [address] [phone]

## 2023-12-09 NOTE — Progress Notes (Deleted)
 Diana [name]

## 2023-12-11 ENCOUNTER — Encounter: Payer: Self-pay | Admitting: Family Medicine

## 2023-12-13 ENCOUNTER — Other Ambulatory Visit: Payer: Self-pay | Admitting: Internal Medicine

## 2023-12-13 ENCOUNTER — Encounter: Payer: Self-pay | Admitting: Family Medicine

## 2023-12-13 ENCOUNTER — Ambulatory Visit: Payer: PPO | Admitting: Family Medicine

## 2023-12-13 DIAGNOSIS — Z1231 Encounter for screening mammogram for malignant neoplasm of breast: Secondary | ICD-10-CM

## 2023-12-13 DIAGNOSIS — G4733 Obstructive sleep apnea (adult) (pediatric): Secondary | ICD-10-CM

## 2023-12-19 ENCOUNTER — Other Ambulatory Visit: Payer: Self-pay | Admitting: Internal Medicine

## 2023-12-19 ENCOUNTER — Other Ambulatory Visit: Payer: Self-pay | Admitting: Cardiovascular Disease

## 2023-12-20 ENCOUNTER — Telehealth: Payer: Self-pay | Admitting: Family Medicine

## 2023-12-20 NOTE — Telephone Encounter (Signed)
 Pt has scheduled her 1 yr f/u with wait list

## 2023-12-27 DIAGNOSIS — F439 Reaction to severe stress, unspecified: Secondary | ICD-10-CM | POA: Diagnosis not present

## 2023-12-27 DIAGNOSIS — E66813 Obesity, class 3: Secondary | ICD-10-CM | POA: Diagnosis not present

## 2023-12-27 DIAGNOSIS — Z6841 Body Mass Index (BMI) 40.0 and over, adult: Secondary | ICD-10-CM | POA: Diagnosis not present

## 2023-12-27 DIAGNOSIS — G4733 Obstructive sleep apnea (adult) (pediatric): Secondary | ICD-10-CM | POA: Diagnosis not present

## 2023-12-27 DIAGNOSIS — I10 Essential (primary) hypertension: Secondary | ICD-10-CM | POA: Diagnosis not present

## 2023-12-27 DIAGNOSIS — E65 Localized adiposity: Secondary | ICD-10-CM | POA: Diagnosis not present

## 2023-12-27 DIAGNOSIS — K5909 Other constipation: Secondary | ICD-10-CM | POA: Diagnosis not present

## 2024-01-24 ENCOUNTER — Ambulatory Visit
Admission: RE | Admit: 2024-01-24 | Discharge: 2024-01-24 | Disposition: A | Discharge status: Home / Self Care | Source: Ambulatory Visit | Attending: Internal Medicine | Admitting: Internal Medicine

## 2024-01-24 ENCOUNTER — Other Ambulatory Visit: Payer: Self-pay | Admitting: Cardiovascular Disease

## 2024-01-24 ENCOUNTER — Encounter: Payer: Self-pay | Admitting: Cardiovascular Disease

## 2024-01-24 DIAGNOSIS — Z1231 Encounter for screening mammogram for malignant neoplasm of breast: Secondary | ICD-10-CM

## 2024-01-25 ENCOUNTER — Telehealth: Payer: Self-pay | Admitting: Cardiovascular Disease

## 2024-01-25 NOTE — Telephone Encounter (Signed)
" °*  STAT* If patient is at the pharmacy, call can be transferred to refill team.   1. Which medications need to be refilled? (please list name of each medication and dose if known) atorvastatin  (LIPITOR) 20 MG tablet    2. Would you like to learn more about the convenience, safety, & potential cost savings by using the [facility]? No    3. Are you open to using the [facility] (Type [facility]. No    4. Which pharmacy/location (including street and city if local pharmacy) is medication to be sent to? [id number] - [place], [place] - [address] [place]     5. Do they need a 30 day or 90 day supply? 90 day   Pt has appt scheduled 2/5  "

## 2024-01-27 ENCOUNTER — Other Ambulatory Visit: Payer: Self-pay

## 2024-01-27 MED ORDER — ATORVASTATIN CALCIUM 20 MG PO TABS: 20.0000 mg | ORAL_TABLET | Freq: Every day | ORAL | 0 refills | Status: AC

## 2024-02-03 ENCOUNTER — Telehealth: Payer: Self-pay

## 2024-02-03 NOTE — Telephone Encounter (Signed)
 Called pt and asked her to bring her CPAP Machine with her to her appointment on tomorrow so we can download her report.

## 2024-02-04 ENCOUNTER — Encounter: Payer: Self-pay | Admitting: Family Medicine

## 2024-02-04 ENCOUNTER — Ambulatory Visit: Admitting: Family Medicine

## 2024-02-04 VITALS — BP 130/84 | HR 87 | Ht 68.0 in | Wt 319.2 lb

## 2024-02-04 DIAGNOSIS — G4733 Obstructive sleep apnea (adult) (pediatric): Secondary | ICD-10-CM | POA: Diagnosis not present

## 2024-02-04 NOTE — Progress Notes (Signed)
 "   [name]: Diana [name] DOB: 04/24/1955  REASON FOR VISIT: follow up HISTORY FROM: [name]  Chief Complaint  [name] presents with   RM 1     [name] is here for CPAP follow-up - no issues with mask or machine - needs new machine and supplies      HISTORY OF PRESENT ILLNESS:  02/04/2024 ALL:  Diana [name] returns for follow up for OSa on CPAP. She continues to do well. She is using either her home or travel machine every day. She denies concerns with her machines. She uses a nasal pillow mask. Her home machine is over 42 years old.     12/07/2022 ALL:  Diana [name] returns for follow up for OSA on CPAP. She was last seen 08/2021 and we repeated HST that showed AHI 24/h and O2 nadir 82%. She reports that DME could not tell her what coverage she would have on new machine. She opted to purchase a travel machine out of pocket. She uses her travel machine, most days. She does travel often and really likes Diana convenience of Diana smaller machine. She admits that she does not change out supplies as often as she could. She is using a nasal mask.     08/20/2021 ALL: Diana [name] returns for follow up for OSA on CPAP. She continues to do very well. She reports not being able to sleep without her machine. She is alternating between two machines. She continues to travel for work. She would like to get a new CPAP. May consider travel machine in Diana future.       08/20/2020 ALL: Diana [name] returns for follow up for OSA on CPAP. She continues to do very well on therapy. She is using her machine or her travel machine every night. She denies concerns with CPAP or supplies.   Compliance report dated 07/21/2020-8/02-2020 combining her home CPAP and travel information shows that she used CPAP every night for greater than 4 hours. Average usage was about 8 hours. Residual AHI was 0.7 on 8-15cmH20. No significant leak noted.   07/20/2019 ALL:  Diana [name] is a 69 y.o. female here today for follow up for OSA on CPAP.  She is  doing very well with CPAP therapy.  She uses her machine every night.  She denies any concerns or issues with her machine.  Compliance report dated 06/19/2019 through 07/18/2019 reveals that she used CPAP every day for compliance of 100%.  She used CPAP greater than 4 hours every day.  Average usage was 7 hours and 13 minutes.  Residual AHI was 0.7 on 8 to 15 cm of water  and an EPR of 1.  There was no significant leak noted.  She is feeling well today and without concerns.  HISTORY: (copied from my note on 07/04/2018)  Diana [name] is a 69 y.o. female here today for follow up of OSA on CPAP.  She is doing very well with CPAP therapy.  She is using her machine every night.  She states that she cannot sleep without it.  She is in need of new supplies.   06/04/2018 - 07/03/2018 07/03/2018 Usage days 30/30 days (100%) >= 4 hours 30 days (100%) < 4 hours 0 days (0%) Usage hours 231 hours 31 minutes Average usage (total days) 7 hours 43 minutes Average usage (days used) 7 hours 43 minutes Median usage (days used) 7 hours 43 minutes Total used hours (value since last reset - 07/03/2018) 7,298 hours AirSense 10 AutoSet Serial number [id number]  Mode AutoSet Min Pressure 8 cmH2O Max Pressure 15 cmH2O EPR Fulltime EPR level 1 Response Standard Therapy Pressure - cmH2O Median: 9.0 95th percentile: 11.4 Maximum: 12.9 Leaks - L/min Median: 1.0 95th percentile: 13.2 Maximum: 21.4 Events per hour AI: 1.3 HI: 0.0 AHI: 1.3 Apnea Index Central: 0.1 Obstructive: 1.2 Unknown: 0.0 RERA Index 0.0 Cheyne-Stokes respiration (average duration per night) 0 minutes (0%)     History (copied from Dr Lionell note on 04/08/2017)   HPI: Diana [name] is a 69  y.o. female seen here at [facility] as a referral from Dr. Blinda. She used to follow Dr. Maurice, who signed her CPAP orders, but she was never scheduled for a formal sleep evaluation.  Diana [name] underwent a split study on 9-28 2012. She has seen me  yearly since then and has an AutoSet CPAP machine. Her sleep habits have not changed. She still not taking naps, she is not a shift worker, she usually goes to bed between 10 and 11 PM and rises at 6.00 in Diana morning, Most of Diana nights she will need an alarm to wake up in Diana morning. She will be received fresh and restored after a nocturnal sleep of about 7-7-1/2 hours.    07-29-12 for a follow up  visit : Diana [name] used Diana auto titrator between . Average daily usage was 7 hours and 42 minutes, which is a very good result. Residual apnea was 0.9 or. She had very minor air leaks and her 95th percentile pressure on CPAP was 10 cm Diana window or for this Ultracet was between 7 and 15 cm ater she endorsed Diana Epworth sleepiness score it only took points today. I would like for her to have Diana machine set at 10 cm water  with a peak TR function. Diana [name] has always been compliant with Diana CPAP but this is in [place] at Diana CPAP actually has reduced her apnea to a single-digit orbital single-digit level. She's 100% compliant. She had been placed on an auto-titrator before Diana SPLIT study and slept well with it. She tried changing to a different sleep setting of 9 cm water , and resumed afterwards her old autoset CPAP machine.  Diana [name] never had a tonsillectomy. During her youth on [place] used a mining engineer for retrognathia. She has no TMJ. She has a deformed anterior chest wall, Her estimated nocturnal sleep time about 7 hours.  She  does not complain of nocturia, as no breakthrough snoring or witnessed apneas.  Diana [name]'s on Diana cardiology history is a patent foramen ovale, she has or a body weight of 239 pounds at 5 feet 8 inches. Since Diana last sleep study she first gained over 60 pounds and lost already 25. College years Diana [name] has never worked shift work again., She does not take daytime naps. Diana [name] is using her 69 year old autotitrator machine. She received a new one after  her sleep study end of 2012 but was unable to tolerate it.  Fam. history: Her father has a history of excessive daytime sleepiness loud snoring and she witnessed several times apneas , also he was never formally diagnosed. He lived to be 95.    09-11-2015 Diana [name] presents today brokenhearted as her CPAP machine has a tight. He has used a AutoSet advantage Diana S8 machine for over 9 years. She has been a very compliant CPAP user with 100% compliance up to early August 2017 average user time of 7 hours 11 minutes,  AutoSet is allowing pressures between 10 and 15 cm water  was 1 cm EPR and Diana residual AHI was 1.2. Air leaks were very mild 91st percentile pressure was 12 cm water . I will sign a prescription and [facility] to [facility] CPAP machine I would like her to have an air sense machine with an O2 sat between 8 and 15 cm water  and 1 cm EPR. I will ask Prentice Saba to set Diana machine for her. She endorsed today Diana Epworth sleepiness score at 2 points, she does not endorse a high fatigue, she is not clinically depressed. She just would like to start CPAP.    Interval history on 04/02/2016, Diana [name] is here for her regular CPAP compliance visits and she has done excellent. 100% compliant 7 hours and 3 minutes average user time for Diana last 30 days, she's using an AutoSet between 8 and 15 cm water , and Diana 95th percentile pressure is 13.1 cm at night. Diana residual apneas is 0.6 per hour. Her husband believes that he heard some breakthrough snoring at night. 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    Interval history from 08 April 2017, I have Diana pleasure of meeting today with Diana [name], a 69 year old Caucasian CPAP user ,with a 93% compliance for Diana last 30 days.  Average use of time 6 hours and 55 minutes on CPAP nightly, Diana machine is an AutoSet between 8 and 15 cmH2O pressure was 1 cm expiratory pressure relief.  Residual AHI is 0.7, 95th percentile  pressure is 10.8, there are no central apneas emerging, there is no high air leak.  Diana [name] has benefited from CPAP therapy as her Epworth sleepiness score is endorsed at only 2 points and her fatigue severity at 10 points. She has noted how bad she felt when Diana power failed and she was without CPAP. She has a second, older CPAP machine at her second residence -    REVIEW OF SYSTEMS: Out of a complete 14 system review of symptoms, Diana [name] complains only of Diana following symptoms, none and all other reviewed systems are negative.  ESS: 2/24, previously 3/24  ALLERGIES: Allergies  Allergen Reactions   Augmentin [Amoxicillin-Pot Clavulanate] Nausea And Vomiting and Other (See Comments)    Has [name] had a PCN reaction causing immediate rash, facial/tongue/throat swelling, SOB or lightheadedness with hypotension: No Has [name] had a PCN reaction causing severe rash involving mucus membranes or skin necrosis: No Has [name] had a PCN reaction that required hospitalization: No Has [name] had a PCN reaction occurring within Diana last 10 years: #  #  #  YES  #  #  #  > 2014 If all of Diana above answers are NO, then may proceed with Cephalosporin use.     HOME MEDICATIONS: Outpatient Medications Prior to Visit  Medication Sig Dispense Refill   ALPRAZolam  (XANAX ) 0.5 MG tablet Take 0.5 mg by mouth as needed for anxiety.  0   amLODipine  (NORVASC ) 5 MG tablet TAKE 1 TABLET (5 MG TOTAL) BY MOUTH DAILY. 90 tablet 1   Aspirin  81 MG CAPS Take 81 mg by mouth daily.     atorvastatin  (LIPITOR) 20 MG tablet Take 1 tablet (20 mg total) by mouth daily. 90 tablet 0   cetirizine (ZYRTEC) 10 MG tablet Take 10 mg by mouth daily.     Cholecalciferol (VITAMIN D -3) 25 MCG (1000 UT) CAPS Take by mouth.     Coenzyme Q10 (  COQ-10) 100 MG CAPS Take 100 mg by mouth daily.     levothyroxine  (SYNTHROID ) 75 MCG tablet Take 1 tablet (75 mcg total) by mouth daily. 90 tablet 3   PRISTIQ 100 MG 24 hr tablet Take  100 mg by mouth daily.  1   Semaglutide-Weight Management (WEGOVY) 2.4 MG/0.75ML SOAJ Inject 2.4 mg into Diana skin once a week.     SUMAtriptan  (IMITREX ) 100 MG tablet May repeat in 2 hours if headache persists or recurs. 27 tablet 2   Estradiol  Acetate (FEMRING) 0.05 MG/24HR RING Place 0.05 each vaginally every 3 (three) months. ([name] not taking: Reported on 02/04/2024)     progesterone  (PROMETRIUM ) 100 MG capsule Take 100 mg by mouth daily. ([name] not taking: Reported on 02/04/2024)     promethazine  (PHENERGAN ) 12.5 MG tablet Take 1 tablet (12.5 mg total) by mouth every 6 (six) hours as needed for nausea or vomiting. ([name] not taking: Reported on 02/04/2024) 15 tablet 0   No facility-administered medications prior to visit.    PAST MEDICAL HISTORY: Past Medical History:  Diagnosis Date   Anxiety    controlled   Atrial septal defect    Small,non significant atrial septal defect with QP/QS of 1.25 by MRA.   Cancer [facility])    melanoma left leg 2012   Complication of anesthesia    gets migraine when she has to be NPO   Depression    Hyperlipidemia    Hypertension    Pt doesn't take meds and denies having BP issues however her diostolic in alway in high 80s -90s   Hypothyroidism    Migraine    occasionally   Migraine headache    Obesity    Obstructive apnea    CPAP user since 2012.    PONV (postoperative nausea and vomiting)    n/v from migraine, not anesthesia   Primary localized osteoarthritis of left knee 08/02/2018   osteo   Pseudotumor cerebri Diagnosed in March 2008-   She sees Dr Claryce in this regard   Sleep apnea    uses CPAP   Statin intolerance    with elevated CRP    PAST SURGICAL HISTORY: Past Surgical History:  Procedure Laterality Date   BACK SURGERY     BIOPSY  07/04/2020   Procedure: BIOPSY;  Surgeon: Eda Iha, MD;  Location: [facility];  Service: Gastroenterology;;   ROMAYNE     COLONOSCOPY WITH PROPOFOL  N/A 07/04/2020   Procedure:  COLONOSCOPY WITH PROPOFOL ;  Surgeon: Eda Iha, MD;  Location: [facility];  Service: Gastroenterology;  Laterality: N/A;   COLONOSCOPY WITH PROPOFOL  N/A 11/10/2021   Procedure: COLONOSCOPY WITH PROPOFOL ;  Surgeon: Eda Iha, MD;  Location: [facility];  Service: Gastroenterology;  Laterality: N/A;   FRACTURE SURGERY     left 5th metatarsal   MASS EXCISION Left 03/22/2018   Procedure: EXCISION CYST DEBRIDEMENT DISTAL INTERPHALANGEAL LEFT MIDDLE FINGER;  Surgeon: Murrell Kuba, MD;  Location: [facility];  Service: Orthopedics;  Laterality: Left;  FAB   ORTHOPEDIC SURGERY     lumbar surgery    PARTIAL KNEE ARTHROPLASTY Right 09/15/2016   Procedure: UNICOMPARTMENTAL KNEE;  Surgeon: Josefina Chew, MD;  Location: [facility];  Service: Orthopedics;  Laterality: Right;   PARTIAL KNEE ARTHROPLASTY Left 08/02/2018   Procedure: UNICOMPARTMENTAL KNEE;  Surgeon: Josefina Chew, MD;  Location: [facility];  Service: Orthopedics;  Laterality: Left;   pin in Diana fifth metatarsal     POLYPECTOMY  11/10/2021   Procedure: POLYPECTOMY;  Surgeon:  Eda Iha, MD;  Location: [facility];  Service: Gastroenterology;;   right knee arthroscopy     WISDOM TOOTH EXTRACTION      FAMILY HISTORY: Family History  Problem Relation Age of Onset   Migraines Mother    Alzheimer's disease Mother    Stroke Father    Rheumatic fever Father    Colon cancer Neg Hx    Esophageal cancer Neg Hx    Stomach cancer Neg Hx    Colon polyps Neg Hx    Rectal cancer Neg Hx    Seizures Neg Hx    Sleep apnea Neg Hx     SOCIAL HISTORY: Social History   Socioeconomic History   Marital status: Married    Spouse name: Lynwood   Number of children: 0   Years of education: masters   Highest education level: Master's degree (e.g., MA, MS, MEng, MEd, MSW, MBA)  Occupational History    Employer: [employer]    Comment: [employer]  Tobacco Use   Smoking status: Never   Smokeless  tobacco: Never  Vaping Use   Vaping status: Never Used  Substance and Sexual Activity   Alcohol use: Not Currently    Alcohol/week: 0.0 - 3.0 standard drinks of alcohol    Comment: occasionally   Drug use: No   Sexual activity: Not Currently  Other Topics Concern   Not on file  Social History Narrative   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 She was referred for evaluation of what was previously been diagnosed as an ASD in 2001 at [facility]. She was advised to have it followed medically. She is marrried and lives with her spouse . She may have two glasses  of wine a week. She does not use tobacco. She sleeps with a CPAP.   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 Enjoys traveling.   Social Drivers of Health   Tobacco Use: Low Risk (02/04/2024)   [name] History    Smoking Tobacco Use: Never    Smokeless Tobacco Use: Never    Passive Exposure: Not on file  Financial Resource Strain: Low Risk (08/20/2023)   Overall Financial Resource Strain (CARDIA)    Difficulty of Paying Living Expenses: Not very hard  Food Insecurity: No Food Insecurity (08/20/2023)   Epic    Worried About Programme Researcher, Broadcasting/film/video in Diana Last Year: Never true    Ran Out of Food in Diana Last Year: Never true  Transportation Needs: No Transportation Needs (08/20/2023)   Epic    Lack of Transportation (Medical): No    Lack of Transportation (Non-Medical): No  Physical Activity: Insufficiently Active (08/20/2023)   Exercise Vital Sign    Days of Exercise per Week: 2 days    Minutes of Exercise per Session: 40 min  Stress: No Stress Concern Present (08/20/2023)   [employer] of Occupational Health - Occupational Stress Questionnaire    Feeling of Stress: Not at all  Social Connections: Socially Integrated (08/20/2023)   Social Connection and Isolation Panel    Frequency of Communication with Friends and  Family: More than three times a week    Frequency of Social Gatherings with Friends and Family: Twice a week    Attends Religious Services: More than 4 times per year    Active Member of [employer] or Organizations: Yes    Attends [employer]  or Organization Meetings: More than 4 times per year    Marital Status: Married  Catering Manager Violence: Not At Risk (08/20/2023)   Epic    Fear of Current or Ex-Partner: No    Emotionally Abused: No    Physically Abused: No    Sexually Abused: No  Depression (PHQ2-9): Low Risk (08/20/2023)   Depression (PHQ2-9)    PHQ-2 Score: 0  Alcohol Screen: Low Risk (08/20/2023)   Alcohol Screen    Last Alcohol Screening Score (AUDIT): 2  Housing: Low Risk (08/20/2023)   Epic    Unable to Pay for Housing in Diana Last Year: No    Number of Times Moved in Diana Last Year: 0    Homeless in Diana Last Year: No  Utilities: Not At Risk (08/20/2023)   Epic    Threatened with loss of utilities: No  Health Literacy: Adequate Health Literacy (08/20/2023)   B1300 Health Literacy    Frequency of need for help with medical instructions: Never      PHYSICAL EXAM  Vitals:   02/04/24 1028  BP: 130/84  Pulse: 87  SpO2: 97%  Weight: (!) 319 lb 3.2 oz (144.8 kg)  Height: 5' 8 (1.727 m)     Body mass index is 48.53 kg/m.  Generalized: Well developed, in no acute distress  Mallampati: 2 Neck circumference 16 Cardiology: normal rate and rhythm, no murmur noted Respiratory: clear to auscultation bilaterally  Neurological examination  Mentation: Alert oriented to time, place, history taking. Follows all commands speech and language fluent Cranial nerve II-XII: Pupils were equal round reactive to light. Extraocular movements were full, visual field were full  Motor: Diana motor testing reveals 5 over 5 strength of all 4 extremities. Good symmetric motor tone is noted throughout.  Gait and station: Gait is normal.   DIAGNOSTIC DATA (LABS, IMAGING, TESTING) - I reviewed [name]  records, labs, notes, testing and imaging myself where available.      No data to display           Lab Results  Component Value Date   WBC 6.1 08/16/2023   HGB 12.8 08/16/2023   HCT 40.0 08/16/2023   MCV 88.3 08/16/2023   PLT 203 08/16/2023      Component Value Date/Time   NA 141 08/16/2023 0942   K 4.3 08/16/2023 0942   CL 105 08/16/2023 0942   CO2 27 08/16/2023 0942   GLUCOSE 89 08/16/2023 0942   BUN 19 08/16/2023 0942   CREATININE 0.79 08/16/2023 0942   CALCIUM  9.0 08/16/2023 0942   PROT 6.4 08/16/2023 0942   ALBUMIN 4.6 07/13/2022 1118   AST 20 08/16/2023 0942   ALT 17 08/16/2023 0942   ALKPHOS 81 07/13/2022 1118   BILITOT 0.3 08/16/2023 0942   GFRNONAA >60 07/13/2022 1118   GFRNONAA 64 11/30/2019 0927   GFRAA 74 11/30/2019 0927   Lab Results  Component Value Date   CHOL 129 08/16/2023   HDL 39 (L) 08/16/2023   LDLCALC 65 08/16/2023   TRIG 176 (H) 08/16/2023   CHOLHDL 3.3 08/16/2023   Lab Results  Component Value Date   HGBA1C 5.5 08/16/2023   No results found for: VITAMINB12 Lab Results  Component Value Date   TSH 1.34 08/16/2023      ASSESSMENT AND PLAN 69 y.o. year old female  has a past medical history of Anxiety, Atrial septal defect, Cancer (HCC), Complication of anesthesia, Depression, Hyperlipidemia, Hypertension, Hypothyroidism, Migraine, Migraine headache, Obesity, Obstructive apnea, PONV (postoperative nausea  and vomiting), Primary localized osteoarthritis of left knee (08/02/2018), Pseudotumor cerebri (Diagnosed in March 2008-), Sleep apnea, and Statin intolerance. here with     ICD-10-CM   1. OSA on CPAP  G47.33 Home sleep test        Kiersten is doing very well on CPAP therapy. Now using a travel machine most every night. Compliance report reveals excellent compliance.  I have encouraged her to continue using CPAP nightly and for greater than 4 hours each night.  We will update supply orders and sent to DME. I will have her repeat  HST and we will order a new machine pending results. Healthy lifestyle habits encouraged.  She will continue close follow-up with primary care.  She will follow-up with me in 31-90 days following set up of new machine.  She verbalizes understanding and agreement with this plan.   Orders Placed This Encounter  Procedures   Home sleep test    Standing Status:   Future    Expiration Date:   02/03/2025    Where should this test be performed::   [facility]      No orders of Diana defined types were placed in this encounter.      Greig Forbes, FNP-C 02/04/2024, 10:51 AM [facility] [address] [place], [place] [address] [phone]  "

## 2024-02-04 NOTE — Patient Instructions (Signed)

## 2024-02-04 NOTE — Progress Notes (Signed)
 30 Days       90 Days

## 2024-02-10 ENCOUNTER — Ambulatory Visit

## 2024-02-10 DIAGNOSIS — G4733 Obstructive sleep apnea (adult) (pediatric): Secondary | ICD-10-CM

## 2024-02-17 NOTE — Progress Notes (Signed)
 Diana [name]                                          MRN: [id number]   02/17/2024   The [facility] Quality Team Specialist reviewed this patient medical record for the purposes of chart review for care gap closure. The following were reviewed: abstraction for care gap closure-controlling blood pressure.    [facility] Quality Team

## 2024-02-24 ENCOUNTER — Encounter: Payer: Self-pay | Admitting: Cardiovascular Disease

## 2024-02-24 ENCOUNTER — Ambulatory Visit: Admitting: Cardiovascular Disease

## 2024-02-24 VITALS — BP 137/80 | HR 82 | Ht 68.0 in | Wt 321.0 lb

## 2024-02-24 DIAGNOSIS — I10 Essential (primary) hypertension: Secondary | ICD-10-CM

## 2024-02-24 DIAGNOSIS — Q211 Atrial septal defect, unspecified: Secondary | ICD-10-CM | POA: Diagnosis not present

## 2024-02-24 DIAGNOSIS — E782 Mixed hyperlipidemia: Secondary | ICD-10-CM

## 2024-02-24 NOTE — Progress Notes (Signed)
 " Cardiology Office Note:    Date:  02/24/2024   ID:  Diana, [name] [birth year], MRN [id number]  PCP:  Perri Ronal PARAS, MD   [facility] Providers Cardiologist:  Ozell Fell, MD     Referring MD: Perri Ronal PARAS, MD   Chief Complaint  Patient presents with   Hypertension    History of Present Illness:    Diana [name] is a 69 y.o. female with a hx of small secundum ASD, obesity, and mixed hyperlipidemia, presenting for follow-up evaluation.  The patient is here alone today.  She continues to struggle with her weight, but otherwise reports no concerns.  She is currently treated with [facility]  She follows closely with Dr. Perri.  Reports no chest pain, shortness of breath, or heart palpitations.  Compliant with her medications.  We reviewed some of her studies today and discussed her cardiac MRI from 2024 that showed no evidence of hemodynamically significant left-to-right shunting through her ASD with normal LV and RV size and systolic function.  She had a coronary CT calcium  score in 2020 with a calcium  score of 0.  Current Medications: Active Medications[1]   Allergies:   Augmentin [amoxicillin-pot clavulanate]   ROS:   Please see the history of present illness.    All other systems reviewed and are negative.  EKGs/Labs/Other Studies Reviewed:    The following studies were reviewed today: Cardiac Studies & Procedures   ______________________________________________________________________________________________     ECHOCARDIOGRAM  ECHOCARDIOGRAM COMPLETE 04/17/2021  Narrative ECHOCARDIOGRAM REPORT    Patient Name:   Diana [name] Date of Exam: 04/17/2021 Medical Rec #:  [id number]          Height:       69.0 in Accession #:    [id number]         Weight:       306.8 lb Date of Birth:  20-May-1955          BSA:          2.478 m Patient Age:    65 years           BP:           146/104 mmHg Patient Gender: F                  HR:           78  bpm. Exam Location:  [name]  Procedure: 2D Echo, Cardiac Doppler and Color Doppler  Indications:    Q21.10 ASD  History:        Patient has prior history of Echocardiogram examinations, most recent 11/17/2018. Risk Factors:Hypertension, Sleep Apnea and HLD.  Sonographer:    Waldo Guadalajara RCS Referring Phys: [phone] Avantae Bither  IMPRESSIONS   1. Left ventricular ejection fraction, by estimation, is 55 to 60%. The left ventricle has normal function. The left ventricle has no regional wall motion abnormalities. Left ventricular diastolic parameters were normal. 2. Right ventricular systolic function is normal. The right ventricular size is mildly enlarged. There is normal pulmonary artery systolic pressure. 3. The mitral valve is normal in structure. No evidence of mitral valve regurgitation. No evidence of mitral stenosis. 4. The aortic valve is tricuspid. Aortic valve regurgitation is not visualized. No aortic stenosis is present. 5. The inferior vena cava is normal in size with greater than 50% respiratory variability, suggesting right atrial pressure of 3 mmHg.  Comparison(s): No significant change from prior study.  Conclusion(s)/Recommendation(s): Otherwise normal echocardiogram,  with minor abnormalities described in the report. ASD not seen. RV normal size and function.  FINDINGS Left Ventricle: Left ventricular ejection fraction, by estimation, is 55 to 60%. The left ventricle has normal function. The left ventricle has no regional wall motion abnormalities. The left ventricular internal cavity size was normal in size. There is no left ventricular hypertrophy. Left ventricular diastolic parameters were normal.  Right Ventricle: The right ventricular size is mildly enlarged. No increase in right ventricular wall thickness. Right ventricular systolic function is normal. There is normal pulmonary artery systolic pressure. The tricuspid regurgitant velocity is 1.96 m/s, and  with an assumed right atrial pressure of 3 mmHg, the estimated right ventricular systolic pressure is 18.4 mmHg.  Left Atrium: Left atrial size was normal in size.  Right Atrium: Right atrial size was normal in size.  Pericardium: There is no evidence of pericardial effusion.  Mitral Valve: The mitral valve is normal in structure. No evidence of mitral valve regurgitation. No evidence of mitral valve stenosis.  Tricuspid Valve: The tricuspid valve is normal in structure. Tricuspid valve regurgitation is trivial. No evidence of tricuspid stenosis.  Aortic Valve: The aortic valve is tricuspid. Aortic valve regurgitation is not visualized. No aortic stenosis is present.  Pulmonic Valve: The pulmonic valve was not well visualized. Pulmonic valve regurgitation is not visualized. No evidence of pulmonic stenosis.  Aorta: The aortic root, ascending aorta, aortic arch and descending aorta are all structurally normal, with no evidence of dilitation or obstruction.  Venous: The inferior vena cava is normal in size with greater than 50% respiratory variability, suggesting right atrial pressure of 3 mmHg.  IAS/Shunts: The atrial septum is grossly normal.   LEFT VENTRICLE PLAX 2D LVIDd:         3.70 cm   Diastology LVIDs:         2.70 cm   LV e' medial:    6.20 cm/s LV PW:         1.00 cm   LV E/e' medial:  9.0 LV IVS:        1.00 cm   LV e' lateral:   7.72 cm/s LVOT diam:     2.00 cm   LV E/e' lateral: 7.3 LV SV:         80 LV SV Index:   32 LVOT Area:     3.14 cm   RIGHT VENTRICLE RV Basal diam:  3.90 cm RV Mid diam:    3.90 cm RV S prime:     13.20 cm/s TAPSE (M-mode): 2.3 cm RVSP:           18.4 mmHg  LEFT ATRIUM             Index        RIGHT ATRIUM           Index LA diam:        3.80 cm 1.53 cm/m   RA Pressure: 3.00 mmHg LA Vol (A2C):   62.8 ml 25.34 ml/m  RA Area:     15.80 cm LA Vol (A4C):   59.9 ml 24.17 ml/m  RA Volume:   44.20 ml  17.84 ml/m LA Biplane Vol: 62.8 ml  25.34 ml/m AORTIC VALVE LVOT Vmax:   115.00 cm/s LVOT Vmean:  77.100 cm/s LVOT VTI:    0.255 m  AORTA Ao Root diam: 3.10 cm Ao Asc diam:  3.40 cm  MITRAL VALVE  TRICUSPID VALVE MV Area (PHT):             TR Peak grad:   15.4 mmHg MV Decel Time:             TR Vmax:        196.00 cm/s MV E velocity: 56.10 cm/s  Estimated RAP:  3.00 mmHg MV A velocity: 90.70 cm/s  RVSP:           18.4 mmHg MV E/A ratio:  0.62 SHUNTS Systemic VTI:  0.26 m Systemic Diam: 2.00 cm  Shelda Bruckner MD Electronically signed by Shelda Bruckner MD Signature Date/Time: 04/17/2021/3:05:25 PM    Final      CT SCANS  CT CARDIAC SCORING (SELF PAY ONLY) 08/12/2018  Addendum 08/12/2018  5:20 PM ADDENDUM REPORT: 08/12/2018 17:18  CLINICAL DATA:  Risk stratification  EXAM: Coronary Calcium  Score  TECHNIQUE: The patient was scanned on a [employer] scanner. Axial non-contrast 3 mm slices were carried out through the heart. The data set was analyzed on a dedicated work station and scored using the Agatson method.  FINDINGS: Non-cardiac: See separate report from [facility].  Ascending Aorta: Normal size, no calcifications.  Pericardium: Normal.  Coronary arteries: Normal origin.  IMPRESSION: Coronary calcium  score of 0. This was 0 percentile for age and sex matched control.   Electronically Signed By: Leim Moose On: 08/12/2018 17:18  Narrative EXAM: OVER-READ INTERPRETATION  CT CHEST  The following report is an over-read performed by radiologist Dr. Franky Crease of [name] on 08/12/2018. This over-read does not include interpretation of cardiac or coronary anatomy or pathology. The coronary calcium  score interpretation by the cardiologist is attached.  COMPARISON:  None.  FINDINGS: Vascular: Heart is normal size.  Visualized aorta is normal caliber.  Mediastinum/Nodes: No adenopathy in the lower mediastinum or  hila.  Lungs/Pleura: Visualized lungs clear.  No effusions.  Upper Abdomen: Imaging into the upper abdomen shows no acute findings.  Musculoskeletal: Chest wall soft tissues are unremarkable. No acute bony abnormality.  IMPRESSION: No acute or significant extracardiac abnormality.  Electronically Signed: By: Franky Crease M.D. On: 08/12/2018 14:18   CARDIAC MRI  MR CARDIAC MORPHOLOGY W WO CONTRAST 11/23/2022  Narrative CLINICAL DATA:  Secundum ASD  EXAM: CARDIAC MRI  TECHNIQUE: The patient was scanned on a 1.5 Tesla Siemens magnet. A dedicated cardiac coil was used. Functional imaging was done using [employer] sequences. 2,3, and 4 chamber views were done to assess for RWMA's. Modified Simpson's rule using a short axis stack was used to calculate an ejection fraction on a dedicated work [employer]. The patient received 10 cc of Gadavist . After 10 minutes inversion recovery sequences were used to assess for infiltration and scar tissue. Phase contrast velocity mapping was performed above the aortic and pulmonic valves  CONTRAST:  10 cc  of Gadavist   FINDINGS: Left ventricle:  -Mild hypertrophy  -Normal size  -Normal systolic function  -Normal ECV (23%) and T2 values  -No LGE  LV EF:  60% (Normal 52-79%)  Absolute volumes:  LV EDV: (Normal 78-167 mL)  LV ESV: 67mL (Normal 21-64 mL)  LV SV: 99mL (Normal 52-114 mL)  CO: 8.2L/min (Normal 2.7-6.3 L/min)  Indexed volumes:  LV EDV: 33mL/sq-m (Normal 50-96 mL/sq-m)  LV ESV: 24mL/sq-m (Normal 10-40 mL/sq-m)  LV SV: 62mL/sq-m (Normal 33-64 mL/sq-m)  CI: 3.1L/min/sq-m (Normal 1.9-3.9 L/min/sq-m)  Right ventricle: Normal size and systolic function  RV EF: 59% (Normal 52-80%)  Absolute volumes:  RV EDV: (Normal 79-175 mL)  RV ESV: 76mL (Normal 13-75 mL)  RV SV: (Normal 56-110 mL)  CO: 9.2L/min (Normal 2.7-6 L/min)  Indexed volumes:  RV EDV: 27mL/sq-m (Normal  51-97 mL/sq-m)  RV ESV: 56mL/sq-m (Normal 9-42 mL/sq-m)  RV SV: 45mL/sq-m (Normal 35-61 mL/sq-m)  CI: 3.5L/min/sq-m (Normal 1.8-3.8 L/min/sq-m)  Left atrium: Normal size.  Secundum ASD.  Qp/Qs is 1.1  Right atrium: Normal size  Mitral valve: Trivial regurgitation  Aortic valve: Tricuspid.  Trivial regurgitation  Tricuspid valve: Trivial regurgitation  Pulmonic valve: Trivial regurgitation  Aorta: Normal proximal ascending aorta  Pericardium: Normal  IMPRESSION: 1. Secundum ASD. Qp/Qs 1.1 and normal RV size suggests small ASD without significant shunting  2. Normal LV size, mild hypertrophy, and normal systolic function (EF 60%)  3.  Normal RV size and systolic function (EF 59%)  4.  No late gadolinium enhancement to suggest myocardial scar   Electronically Signed By: Lonni Nanas M.D. On: 11/27/2022 14:36   ______________________________________________________________________________________________      EKG:        Recent Labs: 08/16/2023: ALT 17; BUN 19; Creat 0.79; Hemoglobin 12.8; Platelets 203; Potassium 4.3; Sodium 141; TSH 1.34  Recent Lipid Panel    Component Value Date/Time   CHOL 129 08/16/2023 0942   CHOL 171 08/20/2021 1115   TRIG 176 (H) 08/16/2023 0942   HDL 39 (L) 08/16/2023 0942   HDL 42 08/20/2021 1115   CHOLHDL 3.3 08/16/2023 0942   VLDL 35 (H) 02/13/2016 1115   LDLCALC 65 08/16/2023 0942     Risk Assessment/Calculations:                Physical Exam:    VS:  BP 137/80 (BP Location: Left Arm)   Pulse 82   Ht 5' 8 (1.727 m)   Wt (!) 321 lb (145.6 kg)   SpO2 95%   BMI 48.81 kg/m     Wt Readings from Last 3 Encounters:  02/24/24 (!) 321 lb (145.6 kg)  02/04/24 (!) 319 lb 3.2 oz (144.8 kg)  08/20/23 (!) 309 lb (140.2 kg)     GEN:  Well nourished, well developed, pleasant, obese woman in no acute distress HEENT: Normal NECK: No JVD; No carotid bruits LYMPHATICS: No lymphadenopathy CARDIAC: RRR, no  murmurs, rubs, gallops RESPIRATORY:  Clear to auscultation without rales, wheezing or rhonchi  ABDOMEN: Soft, non-tender, non-distended MUSCULOSKELETAL:  No edema; No deformity  SKIN: Warm and dry NEUROLOGIC:  Alert and oriented x 3 PSYCHIATRIC:  Normal affect   Assessment & Plan ASD (atrial septal defect) Cardiac MRI performed 11/23/2022 demonstrating a QP/QS of 1.1 and normal RV size suggestive of a small ASD without significant shunting.  Normal LV EF and normal RV size and systolic function noted.  Continue clinical follow-up. Mixed hyperlipidemia Treated with atorvastatin .  Lipids reviewed and show an LDL cholesterol of 65. Essential hypertension Treated with amlodipine .  Blood pressure well-controlled. Morbid obesity due to excess calories (HCC) Discussed today.  Treated with Tzhncb.  She continues to work on this.     Medication Adjustments/Labs and Tests Ordered: Current medicines are reviewed at length with the patient today.  Concerns regarding medicines are outlined above.  Orders Placed This Encounter  Procedures   EKG 12-Lead   No orders of the defined types were placed in this encounter.   Patient Instructions  Medication Instructions:  No medication changes were made at this visit. Continue current regimen.   *If you need a refill on your  cardiac medications before your next appointment, please call your pharmacy*  Lab Work: None ordered today. If you have labs (blood work) drawn today and your tests are completely normal, you will receive your results only by: MyChart Message (if you have MyChart) OR A paper copy in the mail If you have any lab test that is abnormal or we need to change your treatment, we will call you to review the results.  Testing/Procedures: None ordered today.  Follow-Up: At [facility], you and your health needs are our priority.  As part of our continuing mission to provide you with exceptional heart care, our providers are  all part of one team.  This team includes your primary Cardiologist (physician) and Advanced Practice Providers or APPs (Physician Assistants and Nurse Practitioners) who all work together to provide you with the care you need, when you need it.  Your next appointment:   1 year(s)  Provider:   Ozell Fell, MD     Signed, Ozell Fell, MD  02/24/2024 9:16 AM    [facility]     [1]  Current Meds  Medication Sig   ALPRAZolam  (XANAX ) 0.5 MG tablet Take 0.5 mg by mouth as needed for anxiety.   amLODipine  (NORVASC ) 5 MG tablet TAKE 1 TABLET (5 MG TOTAL) BY MOUTH DAILY.   Aspirin  81 MG CAPS Take 81 mg by mouth daily.   atorvastatin  (LIPITOR) 20 MG tablet Take 1 tablet (20 mg total) by mouth daily.   cetirizine (ZYRTEC) 10 MG tablet Take 10 mg by mouth daily.   Cholecalciferol (VITAMIN D -3) 25 MCG (1000 UT) CAPS Take by mouth.   Coenzyme Q10 (COQ-10) 100 MG CAPS Take 100 mg by mouth daily.   levothyroxine  (SYNTHROID ) 75 MCG tablet Take 1 tablet (75 mcg total) by mouth daily.   PRISTIQ 100 MG 24 hr tablet Take 100 mg by mouth daily.   Semaglutide-Weight Management (WEGOVY) 2.4 MG/0.75ML SOAJ Inject 2.4 mg into the skin once a week.   SUMAtriptan  (IMITREX ) 100 MG tablet May repeat in 2 hours if headache persists or recurs.   "

## 2024-02-24 NOTE — Patient Instructions (Signed)

## 2024-02-24 NOTE — Assessment & Plan Note (Signed)
 Treated with atorvastatin .  Lipids reviewed and show an LDL cholesterol of 65.

## 2024-02-24 NOTE — Assessment & Plan Note (Signed)
 Discussed today.  Treated with Tzhncb.  She continues to work on this.

## 2024-08-21 ENCOUNTER — Other Ambulatory Visit: Payer: Self-pay

## 2024-08-25 ENCOUNTER — Encounter: Payer: Self-pay | Admitting: Internal Medicine

## 2025-01-02 ENCOUNTER — Ambulatory Visit: Admitting: Family Medicine
